# Patient Record
Sex: Female | Born: 1970 | Race: Black or African American | Hispanic: No | Marital: Single | Smoking: Never smoker
Health system: Southern US, Community
[De-identification: ages and names within clinical notes are randomized; demographics above are authoritative.]

## PROBLEM LIST (undated history)

## (undated) DIAGNOSIS — D649 Anemia, unspecified: Secondary | ICD-10-CM

## (undated) DIAGNOSIS — K469 Unspecified abdominal hernia without obstruction or gangrene: Secondary | ICD-10-CM

## (undated) DIAGNOSIS — K449 Diaphragmatic hernia without obstruction or gangrene: Secondary | ICD-10-CM

## (undated) DIAGNOSIS — K922 Gastrointestinal hemorrhage, unspecified: Secondary | ICD-10-CM

## (undated) DIAGNOSIS — F419 Anxiety disorder, unspecified: Secondary | ICD-10-CM

## (undated) DIAGNOSIS — N83209 Unspecified ovarian cyst, unspecified side: Secondary | ICD-10-CM

## (undated) DIAGNOSIS — Z5189 Encounter for other specified aftercare: Secondary | ICD-10-CM

## (undated) DIAGNOSIS — G4700 Insomnia, unspecified: Secondary | ICD-10-CM

## (undated) HISTORY — PX: WISDOM TOOTH EXTRACTION: SHX21

## (undated) HISTORY — DX: Diaphragmatic hernia without obstruction or gangrene: K44.9

## (undated) HISTORY — DX: Gastrointestinal hemorrhage, unspecified: K92.2

## (undated) HISTORY — DX: Anemia, unspecified: D64.9

## (undated) HISTORY — DX: Anxiety disorder, unspecified: F41.9

## (undated) HISTORY — DX: Insomnia, unspecified: G47.00

## (undated) HISTORY — DX: Encounter for other specified aftercare: Z51.89

---

## 1994-10-23 DIAGNOSIS — F419 Anxiety disorder, unspecified: Secondary | ICD-10-CM

## 1994-10-23 HISTORY — DX: Anxiety disorder, unspecified: F41.9

## 2010-02-21 ENCOUNTER — Ambulatory Visit: Payer: Self-pay | Admitting: Family Medicine

## 2011-12-22 ENCOUNTER — Encounter (HOSPITAL_COMMUNITY): Payer: Self-pay

## 2011-12-22 ENCOUNTER — Emergency Department (HOSPITAL_COMMUNITY): Payer: Self-pay

## 2011-12-22 ENCOUNTER — Emergency Department (HOSPITAL_COMMUNITY)
Admission: EM | Admit: 2011-12-22 | Discharge: 2011-12-22 | Disposition: A | Discharge status: Home / Self Care | Payer: Self-pay | Attending: Emergency Medicine | Admitting: Emergency Medicine

## 2011-12-22 ENCOUNTER — Other Ambulatory Visit: Payer: Self-pay

## 2011-12-22 DIAGNOSIS — R5381 Other malaise: Secondary | ICD-10-CM | POA: Insufficient documentation

## 2011-12-22 DIAGNOSIS — A084 Viral intestinal infection, unspecified: Secondary | ICD-10-CM

## 2011-12-22 DIAGNOSIS — R197 Diarrhea, unspecified: Secondary | ICD-10-CM | POA: Insufficient documentation

## 2011-12-22 DIAGNOSIS — A088 Other specified intestinal infections: Secondary | ICD-10-CM | POA: Insufficient documentation

## 2011-12-22 DIAGNOSIS — R1084 Generalized abdominal pain: Secondary | ICD-10-CM | POA: Insufficient documentation

## 2011-12-22 DIAGNOSIS — R55 Syncope and collapse: Secondary | ICD-10-CM | POA: Insufficient documentation

## 2011-12-22 DIAGNOSIS — R112 Nausea with vomiting, unspecified: Secondary | ICD-10-CM | POA: Insufficient documentation

## 2011-12-22 DIAGNOSIS — R000 Tachycardia, unspecified: Secondary | ICD-10-CM | POA: Insufficient documentation

## 2011-12-22 LAB — CBC
HCT: 38 % (ref 36.0–46.0)
Hemoglobin: 12.7 g/dL (ref 12.0–15.0)
MCH: 31.1 pg (ref 26.0–34.0)
MCHC: 33.4 g/dL (ref 30.0–36.0)
MCV: 92.9 fL (ref 78.0–100.0)
Platelets: 434 K/uL — ABNORMAL HIGH (ref 150–400)
RBC: 4.09 MIL/uL (ref 3.87–5.11)
RDW: 12.5 % (ref 11.5–15.5)
WBC: 6.8 K/uL (ref 4.0–10.5)

## 2011-12-22 LAB — URINALYSIS, ROUTINE W REFLEX MICROSCOPIC
Bilirubin Urine: NEGATIVE
Glucose, UA: NEGATIVE mg/dL
Hgb urine dipstick: NEGATIVE
Ketones, ur: NEGATIVE mg/dL
Leukocytes, UA: NEGATIVE
Nitrite: NEGATIVE
Protein, ur: NEGATIVE mg/dL
Specific Gravity, Urine: 1.012 (ref 1.005–1.030)
Urobilinogen, UA: 1 mg/dL (ref 0.0–1.0)
pH: 8.5 — ABNORMAL HIGH (ref 5.0–8.0)

## 2011-12-22 LAB — BASIC METABOLIC PANEL
CO2: 30 mEq/L (ref 19–32)
Chloride: 98 mEq/L (ref 96–112)
Creatinine, Ser: 0.74 mg/dL (ref 0.50–1.10)
GFR calc Af Amer: >90 mL/min (ref >90)
Potassium: 3.7 mEq/L (ref 3.5–5.1)

## 2011-12-22 LAB — DIFFERENTIAL
Basophils Absolute: 0 10*3/uL (ref 0.0–0.1)
Lymphocytes Relative: 34 % (ref 12–46)
Monocytes Absolute: 0.5 10*3/uL (ref 0.1–1.0)
Neutro Abs: 3.9 10*3/uL (ref 1.7–7.7)
Neutrophils Relative %: 57 % (ref 43–77)

## 2011-12-22 LAB — POCT PREGNANCY, URINE: Preg Test, Ur: NEGATIVE

## 2011-12-22 MED ORDER — NAPROXEN 500 MG PO TABS: 500.0000 mg | ORAL_TABLET | Freq: Two times a day (BID) | ORAL | Status: AC

## 2011-12-22 MED ORDER — TRAMADOL HCL 50 MG PO TABS: 50.0000 mg | ORAL_TABLET | Freq: Four times a day (QID) | ORAL | Status: AC | PRN

## 2011-12-22 MED ORDER — ONDANSETRON HCL 4 MG/2ML IJ SOLN
4.0000 mg | Freq: Once | INTRAMUSCULAR | Status: AC
  Administered 2011-12-22: 4 mg via INTRAVENOUS
  Filled 2011-12-22: qty 2

## 2011-12-22 MED ORDER — MORPHINE SULFATE 4 MG/ML IJ SOLN
4.0000 mg | Freq: Once | INTRAMUSCULAR | Status: AC
  Administered 2011-12-22: 4 mg via INTRAVENOUS
  Filled 2011-12-22: qty 1

## 2011-12-22 MED ORDER — SODIUM CHLORIDE 0.9 % IV BOLUS (SEPSIS)
1000.0000 mL | Freq: Once | INTRAVENOUS | Status: AC
  Administered 2011-12-22: 1000 mL via INTRAVENOUS

## 2011-12-22 MED ORDER — ONDANSETRON HCL 4 MG PO TABS: 4.0000 mg | ORAL_TABLET | Freq: Four times a day (QID) | ORAL | Status: AC

## 2011-12-22 NOTE — ED Provider Notes (Signed)
History     CSN: [id number]  Arrival date & time 12/22/11  1516   First MD Initiated Contact with Patient 12/22/11 1617      Chief Complaint  Patient presents with  . Nausea    hasn't felt well since past tuesday  . Dizziness  . Diarrhea    (Consider location/radiation/quality/duration/timing/severity/associated sxs/prior treatment) HPI Comments: Patient had a syncopal event prior to arrival. States she's been feeling ill for the past 3-5 days.  nbnb diarrhea/vomiting  Patient is a 41 y.o. female presenting with diarrhea. The history is provided by the patient. No language interpreter was used.  Diarrhea The primary symptoms include fatigue, abdominal pain, nausea and diarrhea. Primary symptoms do not include fever, vomiting, melena, hematemesis, dysuria, myalgias or arthralgias. The illness began 3 to 5 days ago. The onset was gradual. The problem has been gradually worsening.  The fatigue began 3 to 5 days ago. The fatigue has been worsening since its onset. The fatigue is worsened by nothing.  The abdominal pain began more than 2 days ago. The abdominal pain has been gradually worsening since its onset. The abdominal pain is generalized. The abdominal pain does not radiate. The abdominal pain is relieved by nothing.  Nausea began 3 to 5 days ago. The nausea is associated with eating. The nausea is exacerbated by food.  The diarrhea began 3 to 5 days ago. The diarrhea is watery. The diarrhea occurs 2 to 4 times per day.  The illness does not include chills, anorexia, constipation or back pain.    No past medical history on file.  Past Surgical History  Procedure Date  . Cesarean section     x2   . Wisdom tooth extraction     No family history on file.  History  Substance Use Topics  . Smoking status: Never Smoker   . Smokeless tobacco: Not on file  . Alcohol Use: Yes     occasionally    OB History    Grav Para Term Preterm Abortions TAB SAB Ect Mult Living           Review of Systems  Constitutional: Positive for activity change, appetite change and fatigue. Negative for fever and chills.  HENT: Negative for congestion, rhinorrhea, neck pain and neck stiffness.   Respiratory: Negative for cough and shortness of breath.   Cardiovascular: Negative for chest pain and palpitations.  Gastrointestinal: Positive for nausea, abdominal pain and diarrhea. Negative for vomiting, constipation, blood in stool, melena, anorexia and hematemesis.  Genitourinary: Negative for dysuria, urgency, frequency and flank pain.  Musculoskeletal: Negative for myalgias, back pain and arthralgias.  Neurological: Positive for syncope and weakness. Negative for dizziness, light-headedness, numbness and headaches.  All other systems reviewed and are negative.    Allergies  Review of patient's allergies indicates no known allergies.  Home Medications   Current Outpatient Rx  Name Route Sig Dispense Refill  . DIPHENHYDRAMINE HCL 25 MG PO TABS Oral Take 25 mg by mouth every 6 (six) hours as needed. For allergy symptom relief    . IBUPROFEN 200 MG PO TABS Oral Take 200 mg by mouth every 6 (six) hours as needed. For pain relief    . LORAZEPAM 1 MG PO TABS Oral Take 0.5 mg by mouth every 8 (eight) hours.    Marland Kitchen PRENATAL MULTIVITAMIN CH Oral Take 1 tablet by mouth daily.    . SODIUM & POTASSIUM BICARBONATE PO TBEF Oral Take 1 tablet by mouth daily as needed. For cold  and flu symptom relief    . NAPROXEN 500 MG PO TABS Oral Take 1 tablet (500 mg total) by mouth 2 (two) times daily. 30 tablet 0  . ONDANSETRON HCL 4 MG PO TABS Oral Take 1 tablet (4 mg total) by mouth every 6 (six) hours. 12 tablet 0  . TRAMADOL HCL 50 MG PO TABS Oral Take 1 tablet (50 mg total) by mouth every 6 (six) hours as needed for pain. 15 tablet 0    BP 102/51  Pulse 88  Temp 98.4 F (36.9 C)  Resp 20  Ht 4\' 11"  (1.499 m)  Wt 125 lb (56.7 kg)  BMI 25.25 kg/m2  SpO2 100%  LMP 12/15/2011  Physical  Exam  Nursing note and vitals reviewed. Constitutional: She is oriented to person, place, and time. She appears well-developed and well-nourished. No distress.  HENT:  Head: Normocephalic and atraumatic.  Mouth/Throat: Oropharynx is clear and moist.  Eyes: Conjunctivae and EOM are normal. Pupils are equal, round, and reactive to light.  Neck: Normal range of motion. Neck supple.  Cardiovascular: Regular rhythm, normal heart sounds and intact distal pulses.  Exam reveals no gallop and no friction rub.   No murmur heard.      Tachycardic rate  Pulmonary/Chest: Effort normal and breath sounds normal. No respiratory distress. She exhibits no tenderness.  Abdominal: Soft. Bowel sounds are normal. There is no tenderness. There is no rebound and no guarding.  Musculoskeletal: Normal range of motion. She exhibits no tenderness.  Lymphadenopathy:    She has no cervical adenopathy.  Neurological: She is alert and oriented to person, place, and time. No cranial nerve deficit.  Skin: Skin is warm and dry. No rash noted.    ED Course  Procedures (including critical care time)   Date: 12/22/2011  Rate: 105  Rhythm: sinus tachycardia  QRS Axis: normal  Intervals: normal  ST/T Wave abnormalities: normal  Conduction Disutrbances:none  Narrative Interpretation:   Old EKG Reviewed: none available  Labs Reviewed  URINALYSIS, ROUTINE W REFLEX MICROSCOPIC - Abnormal; Notable for the following:    pH 8.5 (*)    All other components within normal limits  CBC - Abnormal; Notable for the following:    Platelets 434 (*)    All other components within normal limits  DIFFERENTIAL  BASIC METABOLIC PANEL  POCT PREGNANCY, URINE   Dg Hip Complete Left  12/22/2011  *RADIOLOGY REPORT*  Clinical Data: Nausea, dizziness and diarrhea, left hip pain  LEFT HIP - COMPLETE 2+ VIEW  Comparison: None.  Findings: There is no evidence of fracture or dislocation.  There is no evidence of arthropathy or other focal bone  abnormality. Soft tissues are unremarkable.  IMPRESSION: Negative exam  Original Report Authenticated By: Rosealee Albee, M.D.     1. Viral gastroenteritis   2. Syncope       MDM  Viral gastroenteritis with secondary syncope from dehydration. Received a liter of fluids with resolution of her symptoms. Hip x-ray negative. No medication for head CT. Remainder workup unremarkable by mouth be discharged home with instructions to followup with her primary care physician and continue aggressive oral hydration at home.  Tachycardia resolved. Signs and symptoms for which to return provided        Dayton Bailiff, MD 12/22/11 2045

## 2011-12-22 NOTE — Discharge Instructions (Signed)
Diet for Diarrhea, Adult Having frequent, runny stools (diarrhea) has many causes. Diarrhea may be caused or worsened by food or drink. Diarrhea may be relieved by changing your diet. IF YOU ARE NOT TOLERATING SOLID FOODS:  Drink enough water and fluids to keep your urine clear or pale yellow.   Avoid sugary drinks and sodas as well as milk-based beverages.   Avoid beverages containing caffeine and alcohol.   You may try rehydrating beverages. You can make your own by following this recipe:    tsp table salt.    tsp baking soda.   ? tsp salt substitute (potassium chloride).   1 tbs + 1 tsp sugar.   1 qt water.  As your stools become more solid, you can start eating solid foods. Add foods one at a time. If a certain food causes your diarrhea to get worse, avoid that food and try other foods. A low fiber, low-fat, and lactose-free diet is recommended. Small, frequent meals may be better tolerated.  Starches  Allowed:  White, French, and pita breads, plain rolls, buns, bagels. Plain muffins, matzo. Soda, saltine, or graham crackers. Pretzels, melba toast, zwieback. Cooked cereals made with water: cornmeal, farina, cream cereals. Dry cereals: refined corn, wheat, rice. Potatoes prepared any way without skins, refined macaroni, spaghetti, noodles, refined rice.   Avoid:  Bread, rolls, or crackers made with whole wheat, multi-grains, rye, bran seeds, nuts, or coconut. Corn tortillas or taco shells. Cereals containing whole grains, multi-grains, bran, coconut, nuts, or raisins. Cooked or dry oatmeal. Coarse wheat cereals, granola. Cereals advertised as "high-fiber." Potato skins. Whole grain pasta, wild or brown rice. Popcorn. Sweet potatoes/yams. Sweet rolls, doughnuts, waffles, pancakes, sweet breads.  Vegetables  Allowed: Strained tomato and vegetable juices. Most well-cooked and canned vegetables without seeds. Fresh: Tender lettuce, cucumber without the skin, cabbage, spinach, bean  sprouts.   Avoid: Fresh, cooked, or canned: Artichokes, baked beans, beet greens, broccoli, Brussels sprouts, corn, kale, legumes, peas, sweet potatoes. Cooked: Green or red cabbage, spinach. Avoid large servings of any vegetables, because vegetables shrink when cooked, and they contain more fiber per serving than fresh vegetables.  Fruit  Allowed: All fruit juices except prune juice. Cooked or canned: Apricots, applesauce, cantaloupe, cherries, fruit cocktail, grapefruit, grapes, kiwi, mandarin oranges, peaches, pears, plums, watermelon. Fresh: Apples without skin, ripe banana, grapes, cantaloupe, cherries, grapefruit, peaches, oranges, plums. Keep servings limited to  cup or 1 piece.   Avoid: Fresh: Apple with skin, apricots, mango, pears, raspberries, strawberries. Prune juice, stewed or dried prunes. Dried fruits, raisins, dates. Large servings of all fresh fruits.  Meat and Meat Substitutes  Allowed: Ground or well-cooked tender beef, ham, veal, lamb, pork, or poultry. Eggs, plain cheese. Fish, oysters, shrimp, lobster, other seafoods. Liver, organ meats.   Avoid: Tough, fibrous meats with gristle. Peanut butter, smooth or chunky. Cheese, nuts, seeds, legumes, dried peas, beans, lentils.  Milk  Allowed: Yogurt, lactose-free milk, kefir, drinkable yogurt, buttermilk, soy milk.   Avoid: Milk, chocolate milk, beverages made with milk, such as milk shakes.  Soups  Allowed: Bouillon, broth, or soups made from allowed foods. Any strained soup.   Avoid: Soups made from vegetables that are not allowed, cream or milk-based soups.  Desserts and Sweets  Allowed: Sugar-free gelatin, sugar-free frozen ice pops made without sugar alcohol.   Avoid: Plain cakes and cookies, pie made with allowed fruit, pudding, custard, cream pie. Gelatin, fruit, ice, sherbet, frozen ice pops. Ice cream, ice milk without nuts. Plain hard candy,   honey, jelly, molasses, syrup, sugar, chocolate syrup, gumdrops,  marshmallows.  Fats and Oils  Allowed: Avoid any fats and oils.   Avoid: Seeds, nuts, olives, avocados. Margarine, butter, cream, mayonnaise, salad oils, plain salad dressings made from allowed foods. Plain gravy, crisp bacon without rind.  Beverages  Allowed: Water, decaffeinated teas, oral rehydration solutions, sugar-free beverages.   Avoid: Fruit juices, caffeinated beverages (coffee, tea, soda or pop), alcohol, sports drinks, or lemon-lime soda or pop.  Condiments  Allowed: Ketchup, mustard, horseradish, vinegar, cream sauce, cheese sauce, cocoa powder. Spices in moderation: allspice, basil, bay leaves, celery powder or leaves, cinnamon, cumin powder, curry powder, ginger, mace, marjoram, onion or garlic powder, oregano, paprika, parsley flakes, ground pepper, rosemary, sage, savory, tarragon, thyme, turmeric.   Avoid: Coconut, honey.  Weight Monitoring: Weigh yourself every day. You should weigh yourself in the morning after you urinate and before you eat breakfast. Wear the same amount of clothing when you weigh yourself. Record your weight daily. Bring your recorded weights to your clinic visits. Tell your caregiver right away if you have gained 3 lb/1.4 kg or more in 1 day, 5 lb/2.3 kg in a week, or whatever amount you were told to report. SEEK IMMEDIATE MEDICAL CARE IF:   You are unable to keep fluids down.   You start to throw up (vomit) or diarrhea keeps coming back (persistent).   Abdominal pain develops, increases, or can be felt in one place (localizes).   You have an oral temperature above 102 F (38.9 C), not controlled by medicine.   Diarrhea contains blood or mucus.   You develop excessive weakness, dizziness, fainting, or extreme thirst.  MAKE SURE YOU:   Understand these instructions.   Will watch your condition.   Will get help right away if you are not doing well or get worse.  Document Released: 12/30/2003 Document Revised: 06/21/2011 Document Reviewed:  04/22/2009 [facility] Patient Information 2012 [place], [place].  Syncope You have had a fainting (syncopal) spell. A fainting episode is a sudden, short-lived loss of consciousness. It results in complete recovery. It occurs because there has been a temporary shortage of oxygen and/or sugar (glucose) to the brain. CAUSES   Blood pressure pills and other medications that may lower blood pressure below normal. Sudden changes in posture (sudden standing).   Over-medication. Take your medications as directed.   Standing too long. This can cause blood to pool in the legs.   Seizure disorders.   Low blood sugar (hypoglycemia) of diabetes. This more commonly causes coma.   Bearing down to go to the bathroom. This can cause your blood pressure to rise suddenly. Your body compensates by making the blood pressure too low when you stop bearing down.   Hardening of the arteries where the brain temporarily does not receive enough blood.   Irregular heart beat and circulatory problems.   Fear, emotional distress, injury, sight of blood, or illness.  Your caregiver will send you home if the syncope was from non-worrisome causes (benign). Depending on your age and health, you may stay to be monitored and observed. If you return home, have someone stay with you if your caregiver feels that is desirable. It is very important to keep all follow-up referrals and appointments in order to properly manage this condition. This is a serious problem which can lead to serious illness and death if not carefully managed.  WARNING: Do not drive or operate machinery until your caregiver feels that it is safe for you to  do so. SEEK IMMEDIATE MEDICAL CARE IF:   You have another fainting episode or faint while lying or sitting down. DO NOT DRIVE YOURSELF. Call 911 if no other help is available.   You have chest pain, are feeling sick to your stomach (nausea), vomiting or abdominal pain.   You have an irregular  heartbeat or one that is very fast (pulse over 120 beats per minute).   You have a loss of feeling in some part of your body or lose movement in your arms or legs.   You have difficulty with speech, confusion, severe weakness, or visual problems.   You become sweaty and/or feel light headed.  Make sure you are rechecked as instructed. Document Released: 10/09/2005 Document Revised: 06/21/2011 Document Reviewed: 05/30/2007 [facility] Patient Information 2012 [place], [place].

## 2011-12-22 NOTE — ED Notes (Signed)
Pt states she hasn't felt well since Tuesday. Nausea, dizziness, diarrhea.  States she was sitting at someone's desk at work today and "fell out". Not sure if she passed out but feels pain on LT side.

## 2013-01-06 ENCOUNTER — Emergency Department (HOSPITAL_COMMUNITY)
Admission: EM | Admit: 2013-01-06 | Discharge: 2013-01-06 | Disposition: A | Discharge status: Home / Self Care | Payer: No Typology Code available for payment source | Attending: Emergency Medicine | Admitting: Emergency Medicine

## 2013-01-06 ENCOUNTER — Emergency Department (HOSPITAL_COMMUNITY): Payer: No Typology Code available for payment source

## 2013-01-06 ENCOUNTER — Encounter (HOSPITAL_COMMUNITY): Payer: Self-pay | Admitting: Emergency Medicine

## 2013-01-06 DIAGNOSIS — Y9389 Activity, other specified: Secondary | ICD-10-CM | POA: Insufficient documentation

## 2013-01-06 DIAGNOSIS — S139XXA Sprain of joints and ligaments of unspecified parts of neck, initial encounter: Secondary | ICD-10-CM | POA: Insufficient documentation

## 2013-01-06 DIAGNOSIS — Y92410 Unspecified street and highway as the place of occurrence of the external cause: Secondary | ICD-10-CM | POA: Insufficient documentation

## 2013-01-06 MED ORDER — METHOCARBAMOL 500 MG PO TABS: 500.0000 mg | ORAL_TABLET | Freq: Two times a day (BID) | ORAL | Status: DC

## 2013-01-06 MED ORDER — IBUPROFEN 200 MG PO TABS
600.0000 mg | ORAL_TABLET | Freq: Once | ORAL | Status: AC
  Administered 2013-01-06: 600 mg via ORAL
  Filled 2013-01-06: qty 3

## 2013-01-06 MED ORDER — METHOCARBAMOL 500 MG PO TABS
500.0000 mg | ORAL_TABLET | Freq: Once | ORAL | Status: AC
  Administered 2013-01-06: 500 mg via ORAL
  Filled 2013-01-06: qty 1

## 2013-01-06 MED ORDER — IBUPROFEN 600 MG PO TABS: 600.0000 mg | ORAL_TABLET | Freq: Four times a day (QID) | ORAL | Status: DC | PRN

## 2013-01-06 NOTE — ED Notes (Signed)
Pt return from testing, RN introduce self to patient with plan of care updated and pt verbalize understanding. Pt has c-collar on and reports having pain to "my neck and back" 7/10. Pt INAD, resp e/u and will continue to monitor pt. Pt is awaiting test results for disposition.
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
## 2013-01-06 NOTE — ED Notes (Signed)
Patient indicated she was concerned about affording the test. Currently verbalized the need for test.

## 2013-01-06 NOTE — ED Notes (Signed)
Pt denies any questions upon discharge. 

## 2013-01-06 NOTE — ED Notes (Signed)
EDP at bedside spoke with patient and now wants the CT scan. CT notified.

## 2013-01-06 NOTE — ED Notes (Signed)
Patient took off c-collar and stated did not need CT. Instructed to put c-collar back in place and notified EDP.
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
## 2013-03-03 ENCOUNTER — Encounter: Payer: Self-pay | Admitting: Medical

## 2013-03-03 ENCOUNTER — Ambulatory Visit (INDEPENDENT_AMBULATORY_CARE_PROVIDER_SITE_OTHER): Payer: Self-pay | Admitting: Medical

## 2013-03-03 VITALS — BP 110/70 | HR 82 | Temp 98.4°F | Wt 131.0 lb

## 2013-03-03 DIAGNOSIS — G4700 Insomnia, unspecified: Secondary | ICD-10-CM

## 2013-03-03 DIAGNOSIS — F411 Generalized anxiety disorder: Secondary | ICD-10-CM

## 2013-03-03 MED ORDER — ZOLPIDEM TARTRATE 5 MG PO TABS: 5.0000 mg | ORAL_TABLET | Freq: Every evening | ORAL | Status: DC | PRN

## 2013-03-03 MED ORDER — LORAZEPAM 2 MG PO TABS: 2.0000 mg | ORAL_TABLET | Freq: Two times a day (BID) | ORAL | Status: DC | PRN

## 2013-03-03 MED ORDER — CITALOPRAM HYDROBROMIDE 20 MG PO TABS: ORAL_TABLET | ORAL | Status: DC

## 2013-03-03 NOTE — Patient Instructions (Signed)
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
## 2013-03-27 ENCOUNTER — Telehealth: Payer: Self-pay | Admitting: Medical

## 2013-03-27 NOTE — Telephone Encounter (Signed)
Pt  Called and requested a refill on ativan. Please send to [facility] on [address].

## 2013-03-28 ENCOUNTER — Other Ambulatory Visit: Payer: Self-pay | Admitting: Medical

## 2013-03-28 MED ORDER — LORAZEPAM 2 MG PO TABS: 2.0000 mg | ORAL_TABLET | Freq: Two times a day (BID) | ORAL | Status: DC | PRN

## 2013-03-28 NOTE — Telephone Encounter (Signed)
Call out the Ativan, make sure she is using the Celexa which we began at her last visit.  Make f/u appt which can be recheck and CPX

## 2013-03-28 NOTE — Telephone Encounter (Signed)
Ativan was called out to the pharmacy per Crosby Oyster PA-C.CLS

## 2013-04-07 ENCOUNTER — Ambulatory Visit: Payer: Self-pay | Admitting: Medical

## 2013-04-07 ENCOUNTER — Encounter: Payer: Self-pay | Admitting: Medical

## 2013-04-07 ENCOUNTER — Telehealth: Payer: Self-pay | Admitting: Internal Medicine

## 2013-04-07 ENCOUNTER — Ambulatory Visit (INDEPENDENT_AMBULATORY_CARE_PROVIDER_SITE_OTHER): Payer: Self-pay | Admitting: Medical

## 2013-04-07 DIAGNOSIS — T43205A Adverse effect of unspecified antidepressants, initial encounter: Secondary | ICD-10-CM

## 2013-04-07 DIAGNOSIS — T43225A Adverse effect of selective serotonin reuptake inhibitors, initial encounter: Secondary | ICD-10-CM

## 2013-04-07 DIAGNOSIS — F411 Generalized anxiety disorder: Secondary | ICD-10-CM

## 2013-04-07 MED ORDER — SERTRALINE HCL 50 MG PO TABS: 50.0000 mg | ORAL_TABLET | Freq: Every day | ORAL | Status: DC

## 2013-04-07 NOTE — Telephone Encounter (Signed)
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

## 2013-04-07 NOTE — Telephone Encounter (Signed)
She was a new patient last visit.  We have follow ups for this very type of issue - medication problems, side effects, etc.   Thus, she needs f/u.   If she had a problem with Citalopram, find out what happened, and we can look at other options.    Bennye Alm -she has been on Ativan BID prior which is a controled substance.   I am not going to just keep refilling this.   Thus, if this is all she is wanting, we need to discussed other option including referral to psychiatry.

## 2013-04-07 NOTE — Telephone Encounter (Signed)
Patient states she took the medication for 14 days. She said she would eat something to help but her stomach always felt upset. CLS She states that the medication caused her to vomit. CLS

## 2013-04-07 NOTE — Telephone Encounter (Signed)
Pt is not going to make appt today and would like you to call her. She states she wants to talk to you about her med. celexa is not helping and she is having side effects. She is vomiting a lot about and hour and half after she takes the med. She stayed on it about 14 days and then has stopped the med now. If you need to send something else to pharmacy. Send to [email or web address]. Pt call pt at [phone] ext [phone]
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
## 2013-05-04 ENCOUNTER — Other Ambulatory Visit: Payer: Self-pay | Admitting: Medical

## 2013-05-05 NOTE — Telephone Encounter (Signed)
Is it okay to refill?

## 2013-05-06 NOTE — Telephone Encounter (Signed)
Patient's medication was called out to the pharmacy per Crosby Oyster PA-C. CLS

## 2013-05-07 ENCOUNTER — Other Ambulatory Visit: Payer: Self-pay | Admitting: Medical

## 2013-05-21 ENCOUNTER — Telehealth: Payer: Self-pay | Admitting: Medical

## 2013-05-21 ENCOUNTER — Other Ambulatory Visit: Payer: Self-pay | Admitting: Medical

## 2013-05-21 MED ORDER — ZOLPIDEM TARTRATE 10 MG PO TABS: 10.0000 mg | ORAL_TABLET | Freq: Every evening | ORAL | Status: DC | PRN

## 2013-05-21 NOTE — Telephone Encounter (Signed)
Pt called and stated she would like to have the dose of ambien increased. She also states she would like to have her ativan lowered. I did inform pt she would need an appt to discuss these issues and she states she doesn't have ins and cant afford to come in. Please call pt at work [phone] and ask for computer lab.

## 2013-05-21 NOTE — Telephone Encounter (Signed)
Called out ambien 10mg  to [facility] and will lower ativan to 1mg  for when she needs a refill on it the next time. She will call when she is out of ativan to get filled for 1mg 

## 2013-05-21 NOTE — Telephone Encounter (Signed)
Please call out Ambien 10mg , #30, (see order).  Is she also needing a refill on her Ativan?

## 2013-05-21 NOTE — Progress Notes (Signed)
Called out [place]

## 2013-05-26 ENCOUNTER — Other Ambulatory Visit: Payer: Self-pay | Admitting: Medical

## 2013-05-26 MED ORDER — LORAZEPAM 1 MG PO TABS: 1.0000 mg | ORAL_TABLET | Freq: Two times a day (BID) | ORAL | Status: DC | PRN

## 2013-05-26 NOTE — Progress Notes (Signed)
I called out Ativan to her pharmacy per Crosby Oyster PA-C. CLS

## 2013-05-26 NOTE — Telephone Encounter (Signed)
Is it okay to refill?

## 2013-06-17 ENCOUNTER — Other Ambulatory Visit: Payer: Self-pay | Admitting: Medical

## 2013-06-17 NOTE — Telephone Encounter (Signed)
Does she have insurance now?  Verify she is taking Zoloft.  How is she doing currently on the medication?

## 2013-06-17 NOTE — Telephone Encounter (Signed)
Is this okay to fill? 

## 2013-06-18 NOTE — Telephone Encounter (Signed)
I lmom to cb. CLS

## 2013-06-18 NOTE — Telephone Encounter (Signed)
No, she doesn't have any insurance. Yes, she is taking the Zolof and the medication is working well. She said she is doing well. CLS

## 2013-06-19 NOTE — Telephone Encounter (Signed)
Called med out to pharmacy  

## 2013-06-26 ENCOUNTER — Other Ambulatory Visit: Payer: Self-pay | Admitting: Medical

## 2013-06-26 NOTE — Telephone Encounter (Signed)
Called out med to pharmacy 

## 2013-06-26 NOTE — Telephone Encounter (Signed)
Is this okay to refill? 

## 2013-06-27 NOTE — Telephone Encounter (Signed)
LMOM TO CB. CLS 

## 2013-06-30 NOTE — Telephone Encounter (Signed)
Lmom to cb. CLS 

## 2013-07-14 ENCOUNTER — Other Ambulatory Visit: Payer: Self-pay | Admitting: Medical

## 2013-07-14 NOTE — Telephone Encounter (Signed)
I called out Lorazepam to her pharmacy per Crosby Oyster PA-C. CLS

## 2013-07-14 NOTE — Telephone Encounter (Signed)
Is this okay to refill? 

## 2013-07-18 ENCOUNTER — Institutional Professional Consult (permissible substitution): Payer: Self-pay | Admitting: Medical

## 2013-07-23 ENCOUNTER — Institutional Professional Consult (permissible substitution): Payer: Self-pay | Admitting: Medical

## 2013-07-25 ENCOUNTER — Other Ambulatory Visit: Payer: Self-pay | Admitting: Medical

## 2013-07-30 ENCOUNTER — Encounter: Payer: Self-pay | Admitting: Medical

## 2013-07-30 ENCOUNTER — Ambulatory Visit (INDEPENDENT_AMBULATORY_CARE_PROVIDER_SITE_OTHER): Payer: Self-pay | Admitting: Medical

## 2013-07-30 VITALS — BP 110/60 | HR 82 | Temp 99.1°F | Resp 16 | Wt 130.0 lb

## 2013-07-30 DIAGNOSIS — G4700 Insomnia, unspecified: Secondary | ICD-10-CM

## 2013-07-30 DIAGNOSIS — F411 Generalized anxiety disorder: Secondary | ICD-10-CM

## 2013-07-30 MED ORDER — SERTRALINE HCL 50 MG PO TABS: 50.0000 mg | ORAL_TABLET | Freq: Every day | ORAL | Status: DC

## 2013-07-30 MED ORDER — ZOLPIDEM TARTRATE 10 MG PO TABS: 10.0000 mg | ORAL_TABLET | Freq: Every evening | ORAL | Status: DC | PRN
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
## 2013-09-15 ENCOUNTER — Telehealth: Payer: Self-pay | Admitting: Medical

## 2013-09-15 ENCOUNTER — Other Ambulatory Visit: Payer: Self-pay | Admitting: Medical

## 2013-09-15 MED ORDER — LORAZEPAM 1 MG PO TABS: ORAL_TABLET | ORAL | Status: DC

## 2013-09-15 NOTE — Telephone Encounter (Signed)
Call out the medication

## 2013-09-15 NOTE — Progress Notes (Signed)
I called out Ativan to her pharmacy. CLS

## 2013-09-16 NOTE — Telephone Encounter (Signed)
I called the patients medication to her pharmacy on 09/15/13. CLS

## 2013-10-09 ENCOUNTER — Emergency Department (HOSPITAL_COMMUNITY): Payer: Self-pay

## 2013-10-09 ENCOUNTER — Encounter (HOSPITAL_COMMUNITY): Payer: Self-pay | Admitting: Emergency Medicine

## 2013-10-09 ENCOUNTER — Emergency Department (HOSPITAL_COMMUNITY)
Admission: EM | Admit: 2013-10-09 | Discharge: 2013-10-09 | Disposition: A | Discharge status: Home / Self Care | Payer: Self-pay | Attending: Emergency Medicine | Admitting: Emergency Medicine

## 2013-10-09 DIAGNOSIS — N83209 Unspecified ovarian cyst, unspecified side: Secondary | ICD-10-CM | POA: Insufficient documentation

## 2013-10-09 DIAGNOSIS — Z3202 Encounter for pregnancy test, result negative: Secondary | ICD-10-CM | POA: Insufficient documentation

## 2013-10-09 DIAGNOSIS — Z79899 Other long term (current) drug therapy: Secondary | ICD-10-CM | POA: Insufficient documentation

## 2013-10-09 DIAGNOSIS — F411 Generalized anxiety disorder: Secondary | ICD-10-CM | POA: Insufficient documentation

## 2013-10-09 DIAGNOSIS — R61 Generalized hyperhidrosis: Secondary | ICD-10-CM | POA: Insufficient documentation

## 2013-10-09 DIAGNOSIS — M79609 Pain in unspecified limb: Secondary | ICD-10-CM | POA: Insufficient documentation

## 2013-10-09 DIAGNOSIS — R110 Nausea: Secondary | ICD-10-CM | POA: Insufficient documentation

## 2013-10-09 DIAGNOSIS — M25519 Pain in unspecified shoulder: Secondary | ICD-10-CM | POA: Insufficient documentation

## 2013-10-09 DIAGNOSIS — R1012 Left upper quadrant pain: Secondary | ICD-10-CM

## 2013-10-09 DIAGNOSIS — G4700 Insomnia, unspecified: Secondary | ICD-10-CM | POA: Insufficient documentation

## 2013-10-09 DIAGNOSIS — R5381 Other malaise: Secondary | ICD-10-CM | POA: Insufficient documentation

## 2013-10-09 DIAGNOSIS — R079 Chest pain, unspecified: Secondary | ICD-10-CM | POA: Insufficient documentation

## 2013-10-09 DIAGNOSIS — M549 Dorsalgia, unspecified: Secondary | ICD-10-CM | POA: Insufficient documentation

## 2013-10-09 LAB — URINALYSIS, ROUTINE W REFLEX MICROSCOPIC
Bilirubin Urine: NEGATIVE
Glucose, UA: NEGATIVE mg/dL
Hgb urine dipstick: NEGATIVE
Ketones, ur: NEGATIVE mg/dL
Leukocytes, UA: NEGATIVE
Nitrite: NEGATIVE
Protein, ur: NEGATIVE mg/dL
Specific Gravity, Urine: 1.01 (ref 1.005–1.030)
Urobilinogen, UA: 0.2 mg/dL (ref 0.0–1.0)
pH: 8 (ref 5.0–8.0)

## 2013-10-09 LAB — CBC
HCT: 34 % — ABNORMAL LOW (ref 36.0–46.0)
Hemoglobin: 11.5 g/dL — ABNORMAL LOW (ref 12.0–15.0)
MCH: 31.5 pg (ref 26.0–34.0)
MCHC: 33.8 g/dL (ref 30.0–36.0)
MCV: 93.2 fL (ref 78.0–100.0)
Platelets: 392 10*3/uL (ref 150–400)
RBC: 3.65 MIL/uL — ABNORMAL LOW (ref 3.87–5.11)
RDW: 12.7 % (ref 11.5–15.5)
WBC: 7.9 10*3/uL (ref 4.0–10.5)

## 2013-10-09 LAB — BASIC METABOLIC PANEL
BUN: 9 mg/dL (ref 6–23)
CO2: 26 mEq/L (ref 19–32)
Calcium: 9.1 mg/dL (ref 8.4–10.5)
Chloride: 100 mEq/L (ref 96–112)
Creatinine, Ser: 0.59 mg/dL (ref 0.50–1.10)
GFR calc Af Amer: >90 mL/min (ref >90)
GFR calc non Af Amer: >90 mL/min (ref >90)
Glucose, Bld: 83 mg/dL (ref 70–99)
Potassium: 4.2 mEq/L (ref 3.5–5.1)
Sodium: 134 mEq/L — ABNORMAL LOW (ref 135–145)

## 2013-10-09 LAB — POCT PREGNANCY, URINE: Preg Test, Ur: NEGATIVE

## 2013-10-09 LAB — LIPASE, BLOOD: Lipase: 32 U/L (ref 11–59)

## 2013-10-09 LAB — POCT I-STAT TROPONIN I: Troponin i, poc: 0 ng/mL (ref 0.00–0.08)

## 2013-10-09 MED ORDER — SODIUM CHLORIDE 0.9 % IV BOLUS (SEPSIS)
1000.0000 mL | Freq: Once | INTRAVENOUS | Status: AC
  Administered 2013-10-09: 1000 mL via INTRAVENOUS

## 2013-10-09 MED ORDER — TRAMADOL HCL 50 MG PO TABS: 50.0000 mg | ORAL_TABLET | Freq: Four times a day (QID) | ORAL | Status: DC | PRN

## 2013-10-09 MED ORDER — ONDANSETRON HCL 4 MG/2ML IJ SOLN
4.0000 mg | Freq: Once | INTRAMUSCULAR | Status: AC
  Administered 2013-10-09: 4 mg via INTRAVENOUS
  Filled 2013-10-09: qty 2

## 2013-10-09 MED ORDER — MORPHINE SULFATE 4 MG/ML IJ SOLN
4.0000 mg | Freq: Once | INTRAMUSCULAR | Status: AC
  Administered 2013-10-09: 4 mg via INTRAVENOUS
  Filled 2013-10-09: qty 1

## 2013-10-09 MED ORDER — KETOROLAC TROMETHAMINE 15 MG/ML IJ SOLN
15.0000 mg | Freq: Once | INTRAMUSCULAR | Status: AC
  Administered 2013-10-09: 15 mg via INTRAVENOUS
  Filled 2013-10-09: qty 1

## 2013-10-09 NOTE — ED Notes (Signed)
Patient transported to CT 

## 2013-10-09 NOTE — ED Notes (Signed)
Pt reports chest pain, night sweats, sob, nausea, weakness, and left flank and low abd pain. Onset three to four days ago.

## 2013-10-09 NOTE — Discharge Planning (Signed)
Partnership for [facility] Community Liaison ext [phone]  Follow up appointment made for 11/03/13 at 3:30pm with the [facility] to establish primary care and to obtain the orange card. Application and my contact information was given for any future questions or concerns. Patient is aware of this appointment.

## 2013-10-10 ENCOUNTER — Ambulatory Visit (INDEPENDENT_AMBULATORY_CARE_PROVIDER_SITE_OTHER): Payer: Self-pay | Admitting: Medical

## 2013-10-10 ENCOUNTER — Encounter: Payer: Self-pay | Admitting: Medical

## 2013-10-10 VITALS — BP 130/60 | HR 118 | Temp 99.5°F | Wt 145.0 lb

## 2013-10-10 DIAGNOSIS — N83299 Other ovarian cyst, unspecified side: Secondary | ICD-10-CM

## 2013-10-10 DIAGNOSIS — R102 Pelvic and perineal pain: Secondary | ICD-10-CM

## 2013-10-10 DIAGNOSIS — N83209 Unspecified ovarian cyst, unspecified side: Secondary | ICD-10-CM

## 2013-10-10 MED ORDER — HYDROCODONE-ACETAMINOPHEN 7.5-325 MG PO TABS: 1.0000 | ORAL_TABLET | Freq: Four times a day (QID) | ORAL | Status: DC | PRN

## 2013-10-10 NOTE — Progress Notes (Signed)
Subjective: Here for pelvic pain, hospital emergency dept f/u.  Went to ED last night, they ended up finding cyst on both ovaries, but left one painful.  She denies prior cysts.   Started having pain 4-5 days ago, but got to the point that she was in severe pain yesterday.  She had some nausea, but no vaginal discharge, no back pain, no vomiting, no bowel changes.   She has noted to have low grade fever.  They gave her pain medications while there, including Morphine, sent her home on tramadol.  Advised she f/u with gynecology.  Using tramadol, less severe pain, but pain is still there.  No other new symptoms.    Past Medical History  Diagnosis Date  . Anxiety 1996  . Insomnia    ROS as in subjective  Objective: Gen: wd, wn, seated Back: nontender Abdomen: +bs, soft, tender pelvic region genarlized, L>R, no mass, no organomegaly Lungs: clear Heart: RRR, normal S1, S2, no murmurs Ext: no edema Pulses normal   Assessment: Encounter Diagnoses  Name Primary?  . Pelvic pain Yes  . Complex ovarian cyst     Plan: Discussed her symptoms, findings on ultrasound and CT, labs.  reviewed ED report.  Script for Hydrocodone prn.   For now can use Hydrocodone for pain q6hours this weekend, then Ultram or OTC Analgesic prn.  We will refer to gynecology.  If worse in the meantime, recheck.

## 2013-10-13 ENCOUNTER — Telehealth: Payer: Self-pay | Admitting: Internal Medicine

## 2013-10-13 NOTE — Telephone Encounter (Signed)
If in worsening pain, will need to see gynecology.  If she wants, go ahead and refer to first available gynecologist for ovarian cyst, pelvic pain, worsening.  Send OV notes, ED report, ultrasound and lab results.

## 2013-10-13 NOTE — Telephone Encounter (Signed)
Patient states that the medication is working better than the Tramadol but she is still having pains. She said that she is also taking Aleve. She did agree to the OB/ GYN appointment. CLS   Appt. Is Dr. Clearance Coots [phone] 11/10/13 @ 215 pm

## 2013-10-13 NOTE — Telephone Encounter (Signed)
Pt states that her pelvic pain on her left side is still in a lot of pain. Her pain level is a 8 and she has tried taking aleve with the pain medicine but she is still in a great deal of pain. The tramadol was causing her to have a fever so she was unable to take that. Send to [facility] [address]. If you need to call pt call [phone] ext [phone]

## 2013-10-14 ENCOUNTER — Other Ambulatory Visit: Payer: Self-pay | Admitting: Medical

## 2013-10-14 ENCOUNTER — Telehealth: Payer: Self-pay | Admitting: Medical

## 2013-10-14 MED ORDER — HYDROCODONE-ACETAMINOPHEN 7.5-325 MG PO TABS: 1.0000 | ORAL_TABLET | Freq: Four times a day (QID) | ORAL | Status: DC | PRN

## 2013-10-14 NOTE — Telephone Encounter (Signed)
rx ready.  Of note, she has gyn appt scheduled.

## 2013-10-14 NOTE — Telephone Encounter (Signed)
Pt is requesting a refill on her pain medication. She states she still has some but it will not last until we get back in the office on Monday. Call when ready.

## 2013-10-20 ENCOUNTER — Other Ambulatory Visit: Payer: Self-pay | Admitting: Medical

## 2013-10-20 ENCOUNTER — Telehealth: Payer: Self-pay | Admitting: Medical

## 2013-10-20 ENCOUNTER — Telehealth: Payer: Self-pay | Admitting: Family Medicine

## 2013-10-20 MED ORDER — HYDROCODONE-ACETAMINOPHEN 7.5-325 MG PO TABS: 1.0000 | ORAL_TABLET | Freq: Four times a day (QID) | ORAL | Status: DC | PRN

## 2013-10-20 MED ORDER — OXYCODONE-ACETAMINOPHEN 5-325 MG PO TABS: 1.0000 | ORAL_TABLET | Freq: Three times a day (TID) | ORAL | Status: DC | PRN

## 2013-10-20 NOTE — Telephone Encounter (Signed)
Pt called and stated that she is still in a lot of pain. Requesting something else for pain. Pt uses [facility] on [address]. Pt can be reached at [phone] ext [phone]

## 2013-10-20 NOTE — Telephone Encounter (Signed)
If her pain is worse than last week, needs to see gyn sooner than scheculed.   I can't keep refilling narcotics.  I give one more round of hydrocodone.

## 2013-10-20 NOTE — Telephone Encounter (Signed)
Advised pt rx for Hydrocodone is ready.

## 2013-10-20 NOTE — Telephone Encounter (Signed)
Patient came by the office and Kristian Covey PA-C change her pain medication and she was given the Rx. CLS

## 2013-10-21 NOTE — ED Provider Notes (Signed)
CSN: [id number]     Arrival date & time 10/09/13  1478 History   First MD Initiated Contact with Patient 10/09/13 [phone]     Chief Complaint  Patient presents with  . Chest Pain  . Abdominal Pain   (Consider location/radiation/quality/duration/timing/severity/associated sxs/prior Treatment) HPI  42 year old female with abdominal pain. Gradual onset about 4 days ago. Pain is been relatively constant progressive. Pain is in the left upper quadrant left flank. Does not radiate. Says he would nausea. No vomiting. No urinary complaints. No diarrhea. She does show generally weak. Multiple other aches and pains including back, chest, legs, shoulders. Feels that she's had night sweats. No weight loss. No history similar symptoms. No intervention prior to arrival.  Past Medical History  Diagnosis Date  . Anxiety 1996  . Insomnia    Past Surgical History  Procedure Laterality Date  . Cesarean section      x2   . Wisdom tooth extraction     Family History  Problem Relation Age of Onset  . Aneurysm Father     brain  . Hepatitis C Father   . Cancer Maternal Aunt     breast  . Cancer Maternal Uncle     prostate?  Marland Kitchen Alcohol abuse Maternal Grandmother   . Heart disease Paternal Grandmother   . Other Paternal Grandfather     unknown   History  Substance Use Topics  . Smoking status: Never Smoker   . Smokeless tobacco: Not on file  . Alcohol Use: No     Comment: occasionally   OB History   Grav Para Term Preterm Abortions TAB SAB Ect Mult Living                 Review of Systems  All systems reviewed and negative, other than as noted in HPI.   Allergies  Citalopram  Home Medications   Current Outpatient Rx  Name  Route  Sig  Dispense  Refill  . Prenatal Vit-Fe Fumarate-FA (MULTIVITAMIN-PRENATAL) 27-0.8 MG TABS tablet   Oral   Take 1 tablet by mouth daily at 12 noon.         . pseudoephedrine-acetaminophen (TYLENOL SINUS) 30-500 MG TABS   Oral   Take 1 tablet by  mouth every 4 (four) hours as needed (for congestion).         Marland Kitchen zolpidem (AMBIEN) 10 MG tablet   Oral   Take 1 tablet (10 mg total) by mouth at bedtime as needed for sleep.   30 tablet   2   . oxyCODONE-acetaminophen (ROXICET) 5-325 MG per tablet   Oral   Take 1 tablet by mouth every 8 (eight) hours as needed for severe pain.   30 tablet   0   . sertraline (ZOLOFT) 50 MG tablet   Oral   Take 50 mg by mouth daily.          BP 111/77  Pulse 101  Temp(Src) 98.7 F (37.1 C) (Oral)  Resp 16  Ht 4\' 11"  (1.499 m)  Wt 140 lb (63.504 kg)  BMI 28.26 kg/m2  SpO2 100%  LMP 09/17/2013 Physical Exam  Nursing note and vitals reviewed. Constitutional: She appears well-developed and well-nourished. No distress.  HENT:  Head: Normocephalic and atraumatic.  Eyes: Conjunctivae are normal. Right eye exhibits no discharge. Left eye exhibits no discharge.  Neck: Neck supple.  Cardiovascular: Normal rate, regular rhythm and normal heart sounds.  Exam reveals no gallop and no friction rub.   No murmur heard.  Pulmonary/Chest: Effort normal and breath sounds normal. No respiratory distress.  Abdominal: Soft. She exhibits no distension. There is tenderness.  Upper quadrant tenderness without rebound or guarding. No distention  Genitourinary:  No CVA tenderness  Musculoskeletal: She exhibits no edema and no tenderness.  Neurological: She is alert.  Skin: Skin is warm and dry.  Psychiatric: She has a normal mood and affect. Her behavior is normal. Thought content normal.    ED Course  Procedures (including critical care time) Labs Review Labs Reviewed  CBC - Abnormal; Notable for the following:    RBC 3.65 (*)    Hemoglobin 11.5 (*)    HCT 34.0 (*)    All other components within normal limits  BASIC METABOLIC PANEL - Abnormal; Notable for the following:    Sodium 134 (*)    All other components within normal limits  LIPASE, BLOOD  URINALYSIS, ROUTINE W REFLEX MICROSCOPIC  POCT  I-STAT TROPONIN I  POCT PREGNANCY, URINE   Imaging Review No results found.  EKG Interpretation    Date/Time:  Thursday October 09 2013 09:37:56 EST Ventricular Rate:  88 PR Interval:  152 QRS Duration: 62 QT Interval:  340 QTC Calculation: 411 R Axis:   79 Text Interpretation:  Normal sinus rhythm Nonspecific ST and T wave abnormality Abnormal ECG ED PHYSICIAN INTERPRETATION AVAILABLE IN [facility] Confirmed by TEST, RECORD ([id number]) on 10/11/2013 8:49:47 AM            MDM   1. Ovarian cyst   2. LUQ pain    42 year old female with abdominal pain. Left upper quadrant. Patient does has a complex ovarian cysts and an area questionable for hydrosalpinx, query the mesosalpinx versus pyosalpinx. Clinically this not a tubo-ovarian abscess. Patient is actually tender in her left upper quadrant and her lower abdomen/pelvis is benign. She is afebrile. No leukocytosis. Any other complaints which cannot be adequately addressed in the emergency room. She is to followup with her PCP for further evaluation these as well as her abdominal pain or with gynecology. Prescription for tramadol. Return precautions were discussed. Outpatient followup.  Raeford Razor, MD 10/21/13 2040

## 2013-10-27 ENCOUNTER — Other Ambulatory Visit: Payer: Self-pay | Admitting: Medical

## 2013-10-27 ENCOUNTER — Telehealth: Payer: Self-pay | Admitting: Medical

## 2013-10-27 MED ORDER — HYDROCODONE-ACETAMINOPHEN 10-325 MG PO TABS: 1.0000 | ORAL_TABLET | Freq: Four times a day (QID) | ORAL | Status: DC | PRN

## 2013-10-27 NOTE — Telephone Encounter (Signed)
Pt states she also tried 1/4 of Oxycodone and med was too strong for her

## 2013-10-27 NOTE — Telephone Encounter (Signed)
Can she try a 1/4 of a tablet?  If this not tolerable, then I can write a different hydrocodone dose.

## 2013-10-27 NOTE — Telephone Encounter (Signed)
Done.  Stop Oxycodone, f/u with gyn as planned.

## 2013-11-05 ENCOUNTER — Telehealth: Payer: Self-pay | Admitting: Medical

## 2013-11-05 ENCOUNTER — Other Ambulatory Visit: Payer: Self-pay | Admitting: Medical

## 2013-11-05 MED ORDER — HYDROCODONE-ACETAMINOPHEN 10-325 MG PO TABS: 1.0000 | ORAL_TABLET | Freq: Four times a day (QID) | ORAL | Status: DC | PRN

## 2013-11-05 NOTE — Telephone Encounter (Signed)
When is the gyn appt?

## 2013-11-05 NOTE — Telephone Encounter (Signed)
This is the last script.

## 2013-11-05 NOTE — Telephone Encounter (Signed)
Patient is aware that her Rx is ready for pick up and she is also aware that this will be the last Rx of pain medication that he will write. CLS

## 2013-11-05 NOTE — Telephone Encounter (Signed)
The appointment is 11/12/13. CLS

## 2013-11-10 ENCOUNTER — Ambulatory Visit: Payer: Self-pay | Admitting: Obstetrics

## 2013-11-12 ENCOUNTER — Ambulatory Visit: Payer: Self-pay | Admitting: Obstetrics

## 2013-11-13 ENCOUNTER — Ambulatory Visit: Payer: Self-pay

## 2013-11-13 ENCOUNTER — Encounter: Payer: Self-pay | Admitting: Internal Medicine

## 2013-11-13 ENCOUNTER — Ambulatory Visit: Payer: Self-pay | Attending: Internal Medicine | Admitting: Internal Medicine

## 2013-11-13 VITALS — BP 128/84 | HR 82 | Temp 98.9°F | Resp 14 | Ht 59.0 in | Wt 148.6 lb

## 2013-11-13 DIAGNOSIS — R1032 Left lower quadrant pain: Secondary | ICD-10-CM | POA: Insufficient documentation

## 2013-11-13 DIAGNOSIS — M25559 Pain in unspecified hip: Secondary | ICD-10-CM

## 2013-11-13 NOTE — Progress Notes (Signed)
Patient ID: Kimberly Bruce, female   DOB: 10-12-1971, 43 y.o.   MRN: [id number]   CC:  HPI:  43 year old female, initially followed by [facility], who is switching care to our clinic. She is primarily here for pelvic pain which has been present since December. The patient's pain is described in the left lower quadrant and suprapubic region. The patient describes the pain as being constant, no associated nausea or vomiting. She denies any radiation of the pain in her pubic region or to her back. She had an extensive workup including Pelvic ultrasound showed a complex mass in the left ovary, differential including hydrosalpinx,hematosalpinx versus pyosalpinx.  Both ovaries show anechoic mass representing multiple cysts/follicles, no ovarian  torsion. Mild uterine enlargement She has been taking hydrocodone for the pain, however [facility] now refuses to give any more refills, she states that she cannot afford to see a gynecologist     Allergies  Allergen Reactions  . Citalopram Nausea And Vomiting   Past Medical History  Diagnosis Date  . Anxiety 1996  . Insomnia    Current Outpatient Prescriptions on File Prior to Visit  Medication Sig Dispense Refill  . HYDROcodone-acetaminophen (NORCO) 10-325 MG per tablet Take 1 tablet by mouth every 6 (six) hours as needed.  20 tablet  0  . zolpidem (AMBIEN) 10 MG tablet Take 1 tablet (10 mg total) by mouth at bedtime as needed for sleep.  30 tablet  2  . Prenatal Vit-Fe Fumarate-FA (MULTIVITAMIN-PRENATAL) 27-0.8 MG TABS tablet Take 1 tablet by mouth daily at 12 noon.      . pseudoephedrine-acetaminophen (TYLENOL SINUS) 30-500 MG TABS Take 1 tablet by mouth every 4 (four) hours as needed (for congestion).      . sertraline (ZOLOFT) 50 MG tablet Take 50 mg by mouth daily.       No current facility-administered medications on file prior to visit.   Family History  Problem Relation Age of Onset  . Aneurysm Father      brain  . Hepatitis C Father   . Cancer Maternal Aunt     breast  . Cancer Maternal Uncle     prostate?  Marland Kitchen Alcohol abuse Maternal Grandmother   . Heart disease Paternal Grandmother   . Other Paternal Grandfather     unknown   History   Social History  . Marital Status: Single    Spouse Name: N/A    Number of Children: N/A  . Years of Education: N/A   Occupational History  . Not on file.   Social History Main Topics  . Smoking status: Never Smoker   . Smokeless tobacco: Not on file  . Alcohol Use: No     Comment: occasionally  . Drug Use: No  . Sexual Activity: No   Other Topics Concern  . Not on file   Social History Narrative  . No narrative on file    Review of Systems  Constitutional: As in history of present illness HENT: Negative for ear pain, nosebleeds, congestion, facial swelling, rhinorrhea, neck pain, neck stiffness and ear discharge.   Eyes: Negative for pain, discharge, redness, itching and visual disturbance.  Respiratory: Negative for cough, choking, chest tightness, shortness of breath, wheezing and stridor.   Cardiovascular: Negative for chest pain, palpitations and leg swelling.  Gastrointestinal: As in history of present illness Genitourinary: Negative for dysuria, urgency, frequency, hematuria, flank pain, decreased urine volume, difficulty urinating and dyspareunia.  Musculoskeletal: Negative for back pain, joint swelling, arthralgias  and gait problem.  Neurological: Negative for dizziness, tremors, seizures, syncope, facial asymmetry, speech difficulty, weakness, light-headedness, numbness and headaches.  Hematological: Negative for adenopathy. Does not bruise/bleed easily.  Psychiatric/Behavioral: Negative for hallucinations, behavioral problems, confusion, dysphoric mood, decreased concentration and agitation.    Objective:   Filed Vitals:   11/13/13 1527  BP: 128/84  Pulse: 82  Temp: 98.9 F (37.2 C)  Resp: 14    Physical Exam   Constitutional: Appears well-developed and well-nourished. No distress.  HENT: Normocephalic. External right and left ear normal. Oropharynx is clear and moist.  Eyes: Conjunctivae and EOM are normal. PERRLA, no scleral icterus.  Neck: Normal ROM. Neck supple. No JVD. No tracheal deviation. No thyromegaly.  CVS: RRR, S1/S2 +, no murmurs, no gallops, no carotid bruit.  Pulmonary: Effort and breath sounds normal, no stridor, rhonchi, wheezes, rales.  Abdominal: Soft. BS +,  no distension, left lower quadrant tenderness, rebound or guarding.  Musculoskeletal: Normal range of motion. No edema and no tenderness.  Lymphadenopathy: No lymphadenopathy noted, cervical, inguinal. Neuro: Alert. Normal reflexes, muscle tone coordination. No cranial nerve deficit. Skin: Skin is warm and dry. No rash noted. Not diaphoretic. No erythema. No pallor.  Psychiatric: Normal mood and affect. Behavior, judgment, thought content normal.   Lab Results  Component Value Date   WBC 7.9 10/09/2013   HGB 11.5* 10/09/2013   HCT 34.0* 10/09/2013   MCV 93.2 10/09/2013   PLT 392 10/09/2013   Lab Results  Component Value Date   CREATININE 0.59 10/09/2013   BUN 9 10/09/2013   NA 134* 10/09/2013   K 4.2 10/09/2013   CL 100 10/09/2013   CO2 26 10/09/2013    No results found for this basename: HGBA1C   Lipid Panel  No results found for this basename: chol, trig, hdl, cholhdl, vldl, ldlcalc       Assessment and plan:   There are no active problems to display for this patient.  Pelvic pain/left lower quadrant pain   Imaging studies as described above Patient needs to see a gynecologist, for possible laparoscopy  She still has her menstrual cycle, 1 pregnancy test was negative and repeat Also rule out chlamydia/gonorrhea in order to rule out PID CEA/CA 125 tumor markers CBC to rule out leukocytosis Referral to gynecology for possible laparoscopy  Followup in 2-3 months    The patient was given clear  instructions to go to ER or return to medical center if symptoms don't improve, worsen or new problems develop. The patient verbalized understanding. The patient was told to call to get any lab results if not heard anything in the next week.

## 2013-11-13 NOTE — Progress Notes (Signed)
Pt is here to establish care and hospital f/u for pelvic pain. Pt suffers from anxiety.

## 2013-11-17 ENCOUNTER — Encounter (HOSPITAL_COMMUNITY): Payer: Self-pay | Admitting: Emergency Medicine

## 2013-11-17 ENCOUNTER — Emergency Department (HOSPITAL_COMMUNITY)
Admission: EM | Admit: 2013-11-17 | Discharge: 2013-11-17 | Disposition: A | Discharge status: Home / Self Care | Payer: Self-pay | Attending: Emergency Medicine | Admitting: Emergency Medicine

## 2013-11-17 ENCOUNTER — Ambulatory Visit: Payer: Self-pay | Admitting: Medical

## 2013-11-17 ENCOUNTER — Telehealth: Payer: Self-pay | Admitting: Medical

## 2013-11-17 ENCOUNTER — Ambulatory Visit: Payer: No Typology Code available for payment source | Attending: Internal Medicine

## 2013-11-17 ENCOUNTER — Telehealth: Payer: Self-pay | Admitting: Family Medicine

## 2013-11-17 DIAGNOSIS — Z3202 Encounter for pregnancy test, result negative: Secondary | ICD-10-CM | POA: Insufficient documentation

## 2013-11-17 DIAGNOSIS — N949 Unspecified condition associated with female genital organs and menstrual cycle: Secondary | ICD-10-CM | POA: Insufficient documentation

## 2013-11-17 DIAGNOSIS — Z8659 Personal history of other mental and behavioral disorders: Secondary | ICD-10-CM | POA: Insufficient documentation

## 2013-11-17 DIAGNOSIS — R102 Pelvic and perineal pain: Secondary | ICD-10-CM

## 2013-11-17 HISTORY — DX: Unspecified ovarian cyst, unspecified side: N83.209

## 2013-11-17 LAB — URINALYSIS, ROUTINE W REFLEX MICROSCOPIC
BILIRUBIN URINE: NEGATIVE
Glucose, UA: NEGATIVE mg/dL
KETONES UR: 40 mg/dL — AB
Leukocytes, UA: NEGATIVE
NITRITE: NEGATIVE
Protein, ur: NEGATIVE mg/dL
Specific Gravity, Urine: 1.016 (ref 1.005–1.030)
Urobilinogen, UA: 0.2 mg/dL (ref 0.0–1.0)
pH: 6 (ref 5.0–8.0)

## 2013-11-17 LAB — COMPREHENSIVE METABOLIC PANEL
ALK PHOS: 76 U/L (ref 39–117)
ALT: 8 U/L (ref 0–35)
AST: 26 U/L (ref 0–37)
Albumin: 4.1 g/dL (ref 3.5–5.2)
BILIRUBIN TOTAL: 0.3 mg/dL (ref 0.3–1.2)
BUN: 10 mg/dL (ref 6–23)
CO2: 22 meq/L (ref 19–32)
Calcium: 9.2 mg/dL (ref 8.4–10.5)
Chloride: 101 mEq/L (ref 96–112)
Creatinine, Ser: 0.54 mg/dL (ref 0.50–1.10)
GLUCOSE: 77 mg/dL (ref 70–99)
POTASSIUM: 3.8 meq/L (ref 3.7–5.3)
SODIUM: 139 meq/L (ref 137–147)
Total Protein: 8.3 g/dL (ref 6.0–8.3)

## 2013-11-17 LAB — CBC WITH DIFFERENTIAL/PLATELET
Basophils Absolute: 0 10*3/uL (ref 0.0–0.1)
Basophils Relative: 0 % (ref 0–1)
EOS PCT: 2 % (ref 0–5)
Eosinophils Absolute: 0.1 10*3/uL (ref 0.0–0.7)
HCT: 36.6 % (ref 36.0–46.0)
Hemoglobin: 12.3 g/dL (ref 12.0–15.0)
LYMPHS ABS: 2.2 10*3/uL (ref 0.7–4.0)
LYMPHS PCT: 32 % (ref 12–46)
MCH: 31.1 pg (ref 26.0–34.0)
MCHC: 33.6 g/dL (ref 30.0–36.0)
MCV: 92.4 fL (ref 78.0–100.0)
Monocytes Absolute: 0.4 10*3/uL (ref 0.1–1.0)
Monocytes Relative: 6 % (ref 3–12)
NEUTROS PCT: 60 % (ref 43–77)
Neutro Abs: 4.1 10*3/uL (ref 1.7–7.7)
Platelets: 351 10*3/uL (ref 150–400)
RBC: 3.96 MIL/uL (ref 3.87–5.11)
RDW: 13.1 % (ref 11.5–15.5)
WBC: 6.9 10*3/uL (ref 4.0–10.5)

## 2013-11-17 LAB — URINE MICROSCOPIC-ADD ON

## 2013-11-17 LAB — WET PREP, GENITAL
TRICH WET PREP: NONE SEEN
YEAST WET PREP: NONE SEEN

## 2013-11-17 LAB — POCT PREGNANCY, URINE: Preg Test, Ur: NEGATIVE

## 2013-11-17 MED ORDER — OXYCODONE-ACETAMINOPHEN 5-325 MG PO TABS
2.0000 | ORAL_TABLET | Freq: Once | ORAL | Status: AC
  Administered 2013-11-17: 2 via ORAL
  Filled 2013-11-17: qty 2

## 2013-11-17 MED ORDER — OXYCODONE-ACETAMINOPHEN 5-325 MG PO TABS: 1.0000 | ORAL_TABLET | ORAL | Status: DC | PRN

## 2013-11-17 NOTE — ED Provider Notes (Signed)
CSN: [id number]     Arrival date & time 11/17/13  1309 History   First MD Initiated Contact with Patient 11/17/13 2018     Chief Complaint  Patient presents with  . Abdominal Pain    Patient is a 43 y.o. female presenting with abdominal pain. The history is provided by the patient.  Abdominal Pain Pain location:  LLQ Pain quality: aching   Pain severity:  Moderate Onset quality:  Gradual Duration: over a month ago. Timing:  Constant Progression:  Worsening Chronicity:  Recurrent Relieved by: pain meds. Worsened by:  Palpation Associated symptoms: no dysuria, no fever, no vaginal bleeding and no vaginal discharge   pt reports she has LLQ/pelvic pain for over a month It occurs everyday It has been worse in past several days due to lack of pain meds  No fever/vomiting  She reports she has h/o known ovarian cysts but is having difficulty following up as outpatient  Past Medical History  Diagnosis Date  . Anxiety 1996  . Insomnia   . Ovarian cyst    Past Surgical History  Procedure Laterality Date  . Cesarean section      x2   . Wisdom tooth extraction     Family History  Problem Relation Age of Onset  . Aneurysm Father     brain  . Hepatitis C Father   . Cancer Maternal Aunt     breast  . Cancer Maternal Uncle     prostate?  Marland Kitchen Alcohol abuse Maternal Grandmother   . Heart disease Paternal Grandmother   . Other Paternal Grandfather     unknown   History  Substance Use Topics  . Smoking status: Never Smoker   . Smokeless tobacco: Not on file  . Alcohol Use: No     Comment: occasionally   OB History   Grav Para Term Preterm Abortions TAB SAB Ect Mult Living                 Review of Systems  Constitutional: Negative for fever.  Gastrointestinal: Positive for abdominal pain.  Genitourinary: Negative for dysuria, vaginal bleeding and vaginal discharge.  All other systems reviewed and are negative.    Allergies  Citalopram  Home Medications  No  current outpatient prescriptions on file. BP 122/59  Pulse 86  Temp(Src) 98.4 F (36.9 C) (Oral)  Resp 18  Wt 148 lb (67.132 kg)  SpO2 100%  LMP 11/11/2013 Physical Exam CONSTITUTIONAL: Well developed/well nourished HEAD: Normocephalic/atraumatic EYES: EOMI/PERRL ENMT: Mucous membranes moist NECK: supple no meningeal signs SPINE:entire spine nontender CV: S1/S2 noted, no murmurs/rubs/gallops noted LUNGS: Lungs are clear to auscultation bilaterally, no apparent distress ABDOMEN: soft, mild LLQ tenderess, no rebound or guarding GU:no cva tenderness, no cmt, bilateral adnexal tenderness noted, no vaginal bleeding noted, chaperone present NEURO: Pt is awake/alert, moves all extremitiesx4 EXTREMITIES: pulses normal, full ROM SKIN: warm, color normal PSYCH: no abnormalities of mood noted  ED Course  Procedures (including critical care time) Pt with abnormal [place] that showed ovarian cysts last month and referred to GYN but unable to f/u as of yet due to insurance reasons She admits the pain worsened when her pain meds ran out She has not had recent pelvic exam Will perform pelvic and reassess  At discharge, pt felt improved She denies any concern for GC/chlamydia, will defer empiric treatment I advised need for outpatient management by OBGYN  I doubt acute abdominal process and I doubt acute ovarian torsion given history/exam Labs Review Labs  Reviewed  URINALYSIS, ROUTINE W REFLEX MICROSCOPIC - Abnormal; Notable for the following:    APPearance HAZY (*)    Hgb urine dipstick MODERATE (*)    Ketones, ur 40 (*)    All other components within normal limits  URINE MICROSCOPIC-ADD ON - Abnormal; Notable for the following:    Squamous Epithelial / LPF FEW (*)    All other components within normal limits  GC/CHLAMYDIA PROBE AMP  WET PREP, GENITAL  CBC WITH DIFFERENTIAL  COMPREHENSIVE METABOLIC PANEL  POCT PREGNANCY, URINE   Imaging Review No results found.  EKG Interpretation    None       MDM  No diagnosis found. Nursing notes including past medical history and social history reviewed and considered in documentation Labs/vital reviewed and considered Previous records reviewed and considered Narcotic database reviewed    Sharyon Cable, MD 11/18/13 [phone]

## 2013-11-17 NOTE — ED Notes (Signed)
Pt is here with LLQ pain that she has been having for a while related to ovarian cyst and reports pain is moving up abdominal area.  Pt has been unable to get in with GYN MD.  Pt has periods of nausea.

## 2013-11-17 NOTE — ED Notes (Signed)
Pt c/o pain in lower abdomen x1 month 9/10, also pain in upper abdomen x3 days, Pt just today got insurance, was here in December to see about the same problem, pt was diagnosed with ovarian cysts but was unable to see OBGYN because of insurance issues, Pt wanted to take care of this issue but isn't able to see MD because the OBGYN MD isnt taking new patients for 3 months, Pt [facility] or another MD office to take care of the cysts sooner

## 2013-11-17 NOTE — Telephone Encounter (Signed)
Also she didn't go to GYN appt due to she didn't have the money to be seen.   Discussed with Audelia Acton & he advised there is nothing we can do here.  She needs to be seen by GYN.  Advised pt of Shane's recommendations.  And she will keep appt for [facility] card today & see Clinic about GYN referral today

## 2013-11-17 NOTE — Telephone Encounter (Signed)
Done

## 2013-11-18 LAB — GC/CHLAMYDIA PROBE AMP
CT Probe RNA: NEGATIVE
GC Probe RNA: NEGATIVE

## 2013-12-08 ENCOUNTER — Telehealth: Payer: Self-pay | Admitting: Medical

## 2013-12-08 NOTE — Telephone Encounter (Signed)
Called pt. LM to contact other doctor office for Ambien refill.

## 2013-12-08 NOTE — Telephone Encounter (Signed)
Per chart, patient transferred to care to internal medicine or [facility].  See last office note.

## 2013-12-08 NOTE — Telephone Encounter (Signed)
Kimberly Bruce, here is the message since the pt called you

## 2013-12-09 ENCOUNTER — Other Ambulatory Visit: Payer: Self-pay | Admitting: Medical

## 2013-12-09 MED ORDER — ZOLPIDEM TARTRATE 10 MG PO TABS: 10.0000 mg | ORAL_TABLET | Freq: Every evening | ORAL | Status: DC | PRN

## 2013-12-25 ENCOUNTER — Encounter: Payer: Self-pay | Admitting: Obstetrics & Gynecology

## 2013-12-29 ENCOUNTER — Telehealth: Payer: Self-pay | Admitting: Medical

## 2013-12-29 NOTE — Telephone Encounter (Signed)
No, I have no results from gynecology

## 2013-12-29 NOTE — Telephone Encounter (Signed)
Please call  Wants to see if you received results from Dr. Ronita Hipps

## 2014-01-07 ENCOUNTER — Telehealth: Payer: Self-pay | Admitting: Medical

## 2014-01-07 NOTE — Telephone Encounter (Signed)
Needs OV.  

## 2014-01-08 NOTE — Telephone Encounter (Signed)
Pt made appt for Fri 3/19

## 2014-01-09 ENCOUNTER — Encounter: Payer: Self-pay | Admitting: Medical

## 2014-01-09 ENCOUNTER — Ambulatory Visit: Payer: Self-pay | Admitting: Internal Medicine

## 2014-01-09 ENCOUNTER — Ambulatory Visit (INDEPENDENT_AMBULATORY_CARE_PROVIDER_SITE_OTHER): Payer: BC Managed Care – PPO | Admitting: Medical

## 2014-01-09 VITALS — BP 110/70 | HR 62 | Temp 98.6°F | Resp 16 | Wt 141.0 lb

## 2014-01-09 DIAGNOSIS — N83209 Unspecified ovarian cyst, unspecified side: Secondary | ICD-10-CM

## 2014-01-09 DIAGNOSIS — F411 Generalized anxiety disorder: Secondary | ICD-10-CM

## 2014-01-09 DIAGNOSIS — G4700 Insomnia, unspecified: Secondary | ICD-10-CM

## 2014-01-09 MED ORDER — LORAZEPAM 1 MG PO TABS: 1.0000 mg | ORAL_TABLET | Freq: Three times a day (TID) | ORAL | Status: DC

## 2014-01-09 MED ORDER — ZOLPIDEM TARTRATE 10 MG PO TABS: 10.0000 mg | ORAL_TABLET | Freq: Every evening | ORAL | Status: DC | PRN

## 2014-01-09 MED ORDER — SERTRALINE HCL 100 MG PO TABS: ORAL_TABLET | ORAL | Status: DC
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
## 2014-01-12 ENCOUNTER — Telehealth: Payer: Self-pay | Admitting: Medical

## 2014-01-12 NOTE — Telephone Encounter (Signed)
Let her know that I spoke to Tanda Rockers her counselor.  He gave me good insight on her history of sleep and anxiety issues, PTSD.  He did give me some feedback on her medication regimen.  He felt like her current medications are fine.  Thus, I do want to see how she does back on Zoloft once daily, and at this time she can continue to use Ativan when necessary for anxiety attacks, and if needed can use sleep aid such as these zolpidem or over-the-counter remedies such as Benadryl or melatonin.  Continue to use counseling, exercise, prayer, and reflection to cope with the things that she faces.  I again encourage her to call Dr. Kennith Maes nurse if she has other questions and concerns.  I hope things go well for her upcoming surgery.    I would like her to call back in 3-4 weeks to let me know how things are going

## 2014-01-12 NOTE — Telephone Encounter (Signed)
Patient is aware of Shane Tysinger PAC message in detail. CLS 

## 2014-01-12 NOTE — Telephone Encounter (Signed)
LMOM TO CB. CLS 

## 2014-01-14 ENCOUNTER — Telehealth: Payer: Self-pay | Admitting: Family Medicine

## 2014-01-14 NOTE — Telephone Encounter (Signed)
Patient called about her medications. She states that she takes a half of Ativan when she gets off work and then she takes a whole tablet along with 2 Hydrocodone. I explain to her that, that is to much medication to be taking at one time and that's the reason she still feels the effects of this the next day because she is still sedated. I explained all of this to [facility] and he said that she will have to cut back on the pain medication or the Ativan but she can't take all of that at the same time. I went over over with her that if she needs the pain medication that she will have to cut out the Ativan. She said she understood and that she will cut out the Ativan at night. CLS

## 2014-01-16 ENCOUNTER — Other Ambulatory Visit: Payer: Self-pay | Admitting: Obstetrics and Gynecology

## 2014-01-27 ENCOUNTER — Telehealth: Payer: Self-pay | Admitting: Medical

## 2014-01-27 ENCOUNTER — Encounter (HOSPITAL_COMMUNITY): Payer: Self-pay | Admitting: Pharmacist

## 2014-01-27 ENCOUNTER — Encounter (HOSPITAL_COMMUNITY): Payer: Self-pay | Admitting: *Deleted

## 2014-01-27 NOTE — Telephone Encounter (Signed)
You can give her samples of Belsomra 10mg  (3 pack as a trial) instead of Ambien.  I thought per last visit that Ambien was working or helpful?

## 2014-01-27 NOTE — Telephone Encounter (Signed)
Patient is aware of her samples up front. CLS  She states that she didn't want to take Ambien and the Hydrocodone together. CLS

## 2014-01-29 NOTE — H&P (Signed)
Kimberly Bruce, ATAYDE               ACCOUNT NO.:  [id number]  MEDICAL RECORD NO.:  [id number]  LOCATION:  [facility]                         FACILITY:  [facility]  PHYSICIAN:  Lovenia Kim, M.D.DATE OF BIRTH:  1971/07/08  DATE OF ADMISSION:  01/30/2014 DATE OF DISCHARGE:                             HISTORY & PHYSICAL   CHIEF COMPLAINT:  Pelvic pain.  HISTORY OF PRESENT ILLNESS:  The patient is a 43 year old African American female, G4, P2, with approximately 1-2 months history of pelvic Pain, sono c/w questionable pelvic mass, left hydrosalpinx, and now presents for surgical evaluation and definitive therapy.  She whishes to proceed with as conservative therapy as possible.  She has no known drug allergies.  Medications are hydrocodone.  She has a family history of breast cancer, diabetes, heart disease, chronic hypertension, history of four previous pregnancies, two C- sections, two vaginal deliveries.  No other surgical or medical hospitalizations.  PHYSICAL EXAMINATION:  GENERAL:  She is a well-developed, well- nourished, African American female in no acute distress. HEENT:  Normal. NECK:  Supple.  Full range of motion. LUNGS:  Clear. HEART:  Regular rate and rhythm. ABDOMEN:  Soft, nontender. PELVIC:  Diffusely tender, no mass.  IMPRESSION:  Pelvic pain, questionable etiology.  Ultrasound c/w hydrosalpinx.  PLAN:  Proceed with diagnostic laparoscopy, possible da Vinci-assisted laparoscopic lysis of adhesions, possible salpingectomy.  Risks of anesthesia, infection, bleeding, injury to surrounding organs, possible need for repair was discussed.  Delayed versus immediate complications to include bowel and bladder injury noted.  Inability to cure all type of pain has been discussed.  Possible need for ovarian and/or tube removal has been noted.  The patient's consents were signed, acknowledges and wishes to proceed.     Lovenia Kim, M.D.     RJT/MEDQ  D:   01/29/2014  T:  01/29/2014  Job:  [id number]

## 2014-01-30 ENCOUNTER — Encounter (HOSPITAL_COMMUNITY): Payer: Self-pay | Admitting: Anesthesiology

## 2014-01-30 ENCOUNTER — Encounter (HOSPITAL_COMMUNITY): Payer: BC Managed Care – PPO | Admitting: Anesthesiology

## 2014-01-30 ENCOUNTER — Ambulatory Visit (HOSPITAL_COMMUNITY)
Admission: RE | Admit: 2014-01-30 | Discharge: 2014-01-30 | Disposition: A | Discharge status: Home / Self Care | Payer: BC Managed Care – PPO | Source: Ambulatory Visit | Attending: Obstetrics and Gynecology | Admitting: Obstetrics and Gynecology

## 2014-01-30 ENCOUNTER — Ambulatory Visit (HOSPITAL_COMMUNITY): Payer: BC Managed Care – PPO | Admitting: Anesthesiology

## 2014-01-30 ENCOUNTER — Encounter (HOSPITAL_COMMUNITY)
Admission: RE | Disposition: A | Discharge status: Home / Self Care | Payer: Self-pay | Source: Ambulatory Visit | Attending: Obstetrics and Gynecology

## 2014-01-30 DIAGNOSIS — Z8249 Family history of ischemic heart disease and other diseases of the circulatory system: Secondary | ICD-10-CM | POA: Insufficient documentation

## 2014-01-30 DIAGNOSIS — Y9210 Unspecified residential institution as the place of occurrence of the external cause: Secondary | ICD-10-CM | POA: Insufficient documentation

## 2014-01-30 DIAGNOSIS — N736 Female pelvic peritoneal adhesions (postinfective): Secondary | ICD-10-CM | POA: Insufficient documentation

## 2014-01-30 DIAGNOSIS — R1032 Left lower quadrant pain: Secondary | ICD-10-CM | POA: Insufficient documentation

## 2014-01-30 DIAGNOSIS — Z833 Family history of diabetes mellitus: Secondary | ICD-10-CM | POA: Insufficient documentation

## 2014-01-30 DIAGNOSIS — N831 Corpus luteum cyst of ovary, unspecified side: Secondary | ICD-10-CM | POA: Insufficient documentation

## 2014-01-30 DIAGNOSIS — IMO0002 Reserved for concepts with insufficient information to code with codable children: Secondary | ICD-10-CM | POA: Insufficient documentation

## 2014-01-30 DIAGNOSIS — Z803 Family history of malignant neoplasm of breast: Secondary | ICD-10-CM | POA: Insufficient documentation

## 2014-01-30 DIAGNOSIS — F411 Generalized anxiety disorder: Secondary | ICD-10-CM | POA: Insufficient documentation

## 2014-01-30 DIAGNOSIS — N949 Unspecified condition associated with female genital organs and menstrual cycle: Secondary | ICD-10-CM | POA: Insufficient documentation

## 2014-01-30 DIAGNOSIS — N8300 Follicular cyst of ovary, unspecified side: Secondary | ICD-10-CM | POA: Insufficient documentation

## 2014-01-30 DIAGNOSIS — R102 Pelvic and perineal pain: Secondary | ICD-10-CM

## 2014-01-30 DIAGNOSIS — N7093 Salpingitis and oophoritis, unspecified: Secondary | ICD-10-CM | POA: Insufficient documentation

## 2014-01-30 DIAGNOSIS — S3760XA Unspecified injury of uterus, initial encounter: Secondary | ICD-10-CM | POA: Insufficient documentation

## 2014-01-30 HISTORY — PX: LAPAROSCOPY: SHX197

## 2014-01-30 HISTORY — PX: ROBOTIC ASSISTED LAPAROSCOPIC LYSIS OF ADHESION: SHX6080

## 2014-01-30 LAB — HCG, SERUM, QUALITATIVE: Preg, Serum: NEGATIVE

## 2014-01-30 LAB — CBC
HCT: 32.8 % — ABNORMAL LOW (ref 36.0–46.0)
Hemoglobin: 10.9 g/dL — ABNORMAL LOW (ref 12.0–15.0)
MCH: 31.2 pg (ref 26.0–34.0)
MCHC: 33.2 g/dL (ref 30.0–36.0)
MCV: 94 fL (ref 78.0–100.0)
Platelets: 415 10*3/uL — ABNORMAL HIGH (ref 150–400)
RBC: 3.49 MIL/uL — AB (ref 3.87–5.11)
RDW: 14.3 % (ref 11.5–15.5)
WBC: 4.8 10*3/uL (ref 4.0–10.5)

## 2014-01-30 SURGERY — LAPAROSCOPY, DIAGNOSTIC
Anesthesia: General | Site: Abdomen

## 2014-01-30 MED ORDER — ONDANSETRON HCL 4 MG/2ML IJ SOLN
INTRAMUSCULAR | Status: DC | PRN
  Administered 2014-01-30: 4 mg via INTRAVENOUS

## 2014-01-30 MED ORDER — ARTIFICIAL TEARS OP OINT
TOPICAL_OINTMENT | OPHTHALMIC | Status: DC | PRN
  Administered 2014-01-30: 1 via OPHTHALMIC

## 2014-01-30 MED ORDER — ONDANSETRON HCL 4 MG/2ML IJ SOLN
INTRAMUSCULAR | Status: AC
  Filled 2014-01-30: qty 2

## 2014-01-30 MED ORDER — STERILE WATER FOR IRRIGATION IR SOLN
Status: DC | PRN
  Administered 2014-01-30: 1000 mL

## 2014-01-30 MED ORDER — DEXAMETHASONE SODIUM PHOSPHATE 10 MG/ML IJ SOLN
INTRAMUSCULAR | Status: DC | PRN
  Administered 2014-01-30: 10 mg via INTRAVENOUS

## 2014-01-30 MED ORDER — MIDAZOLAM HCL 2 MG/2ML IJ SOLN
INTRAMUSCULAR | Status: DC | PRN
  Administered 2014-01-30: 2 mg via INTRAVENOUS

## 2014-01-30 MED ORDER — PROMETHAZINE HCL 25 MG/ML IJ SOLN: 6.2500 mg | INTRAMUSCULAR | Status: DC | PRN

## 2014-01-30 MED ORDER — LIDOCAINE HCL (CARDIAC) 20 MG/ML IV SOLN
INTRAVENOUS | Status: DC | PRN
  Administered 2014-01-30: 60 mg via INTRAVENOUS

## 2014-01-30 MED ORDER — FENTANYL CITRATE 0.05 MG/ML IJ SOLN
INTRAMUSCULAR | Status: AC
  Filled 2014-01-30: qty 2

## 2014-01-30 MED ORDER — PROPOFOL 10 MG/ML IV BOLUS
INTRAVENOUS | Status: DC | PRN
  Administered 2014-01-30: 160 mg via INTRAVENOUS

## 2014-01-30 MED ORDER — KETOROLAC TROMETHAMINE 30 MG/ML IJ SOLN
INTRAMUSCULAR | Status: AC
  Filled 2014-01-30: qty 1

## 2014-01-30 MED ORDER — BUPIVACAINE HCL (PF) 0.25 % IJ SOLN
INTRAMUSCULAR | Status: AC
  Filled 2014-01-30: qty 30

## 2014-01-30 MED ORDER — LIDOCAINE HCL (CARDIAC) 20 MG/ML IV SOLN
INTRAVENOUS | Status: AC
  Filled 2014-01-30: qty 5

## 2014-01-30 MED ORDER — NEOSTIGMINE METHYLSULFATE 1 MG/ML IJ SOLN
INTRAMUSCULAR | Status: AC
  Filled 2014-01-30: qty 1

## 2014-01-30 MED ORDER — PROPOFOL 10 MG/ML IV EMUL
INTRAVENOUS | Status: AC
  Filled 2014-01-30: qty 20

## 2014-01-30 MED ORDER — NEOSTIGMINE METHYLSULFATE 1 MG/ML IJ SOLN
INTRAMUSCULAR | Status: DC | PRN
  Administered 2014-01-30: 3 mg via INTRAVENOUS

## 2014-01-30 MED ORDER — FENTANYL CITRATE 0.05 MG/ML IJ SOLN
INTRAMUSCULAR | Status: AC
  Filled 2014-01-30: qty 5

## 2014-01-30 MED ORDER — FENTANYL CITRATE 0.05 MG/ML IJ SOLN
INTRAMUSCULAR | Status: DC | PRN
  Administered 2014-01-30: 25 ug via INTRAVENOUS
  Administered 2014-01-30: 100 ug via INTRAVENOUS
  Administered 2014-01-30: 25 ug via INTRAVENOUS
  Administered 2014-01-30: 50 ug via INTRAVENOUS
  Administered 2014-01-30 (×2): 25 ug via INTRAVENOUS
  Administered 2014-01-30: 100 ug via INTRAVENOUS

## 2014-01-30 MED ORDER — BUPIVACAINE HCL (PF) 0.25 % IJ SOLN
INTRAMUSCULAR | Status: DC | PRN
  Administered 2014-01-30: 10 mL

## 2014-01-30 MED ORDER — OXYCODONE HCL 5 MG PO TABS: 5.0000 mg | ORAL_TABLET | Freq: Once | ORAL | Status: DC | PRN

## 2014-01-30 MED ORDER — EPHEDRINE SULFATE 50 MG/ML IJ SOLN
INTRAMUSCULAR | Status: DC | PRN
  Administered 2014-01-30 (×2): 10 mg via INTRAVENOUS

## 2014-01-30 MED ORDER — KETOROLAC TROMETHAMINE 30 MG/ML IJ SOLN: 15.0000 mg | Freq: Once | INTRAMUSCULAR | Status: DC | PRN

## 2014-01-30 MED ORDER — OXYCODONE HCL 5 MG/5ML PO SOLN: 5.0000 mg | Freq: Once | ORAL | Status: DC | PRN

## 2014-01-30 MED ORDER — ACETAMINOPHEN 325 MG PO TABS: 325.0000 mg | ORAL_TABLET | ORAL | Status: DC | PRN

## 2014-01-30 MED ORDER — MIDAZOLAM HCL 2 MG/2ML IJ SOLN
INTRAMUSCULAR | Status: AC
  Filled 2014-01-30: qty 2

## 2014-01-30 MED ORDER — PHENYLEPHRINE HCL 10 MG/ML IJ SOLN
INTRAMUSCULAR | Status: DC | PRN
  Administered 2014-01-30: 40 ug via INTRAVENOUS
  Administered 2014-01-30: 80 ug via INTRAVENOUS
  Administered 2014-01-30: 40 ug via INTRAVENOUS
  Administered 2014-01-30 (×3): 80 ug via INTRAVENOUS

## 2014-01-30 MED ORDER — FENTANYL CITRATE 0.05 MG/ML IJ SOLN
25.0000 ug | INTRAMUSCULAR | Status: DC | PRN
Start: 2014-01-30 — End: 2014-01-30

## 2014-01-30 MED ORDER — GLYCOPYRROLATE 0.2 MG/ML IJ SOLN
INTRAMUSCULAR | Status: DC | PRN
  Administered 2014-01-30: 0.1 mg via INTRAVENOUS
  Administered 2014-01-30: 0.3 mg via INTRAVENOUS

## 2014-01-30 MED ORDER — ROPIVACAINE HCL 5 MG/ML IJ SOLN
INTRAMUSCULAR | Status: AC
  Filled 2014-01-30: qty 60

## 2014-01-30 MED ORDER — SODIUM CHLORIDE 0.9 % IJ SOLN
INTRAMUSCULAR | Status: AC
  Filled 2014-01-30: qty 10

## 2014-01-30 MED ORDER — KETOROLAC TROMETHAMINE 30 MG/ML IJ SOLN
INTRAMUSCULAR | Status: DC | PRN
  Administered 2014-01-30: 30 mg via INTRAVENOUS

## 2014-01-30 MED ORDER — LACTATED RINGERS IV SOLN
INTRAVENOUS | Status: DC
  Administered 2014-01-30 (×3): via INTRAVENOUS

## 2014-01-30 MED ORDER — HYDROMORPHONE HCL PF 1 MG/ML IJ SOLN
INTRAMUSCULAR | Status: AC
  Filled 2014-01-30: qty 1

## 2014-01-30 MED ORDER — CEFAZOLIN SODIUM-DEXTROSE 2-3 GM-% IV SOLR: 2.0000 g | INTRAVENOUS | Status: DC

## 2014-01-30 MED ORDER — ACETAMINOPHEN 160 MG/5ML PO SOLN: 325.0000 mg | ORAL | Status: DC | PRN

## 2014-01-30 MED ORDER — ROCURONIUM BROMIDE 100 MG/10ML IV SOLN
INTRAVENOUS | Status: DC | PRN
  Administered 2014-01-30: 30 mg via INTRAVENOUS
  Administered 2014-01-30: 20 mg via INTRAVENOUS

## 2014-01-30 MED ORDER — OXYCODONE-ACETAMINOPHEN 5-325 MG PO TABS: 1.0000 | ORAL_TABLET | ORAL | Status: DC | PRN

## 2014-01-30 MED ORDER — CEFAZOLIN SODIUM-DEXTROSE 2-3 GM-% IV SOLR
INTRAVENOUS | Status: AC
Start: 2014-01-30 — End: 2014-01-30
  Administered 2014-01-30: 2 g via INTRAVENOUS
  Filled 2014-01-30: qty 50

## 2014-01-30 MED ORDER — DEXAMETHASONE SODIUM PHOSPHATE 10 MG/ML IJ SOLN
INTRAMUSCULAR | Status: AC
  Filled 2014-01-30: qty 1

## 2014-01-30 MED ORDER — SODIUM CHLORIDE 0.9 % IJ SOLN
INTRAMUSCULAR | Status: AC
  Filled 2014-01-30: qty 50

## 2014-01-30 MED ORDER — ROPIVACAINE HCL 5 MG/ML IJ SOLN
INTRAMUSCULAR | Status: DC | PRN
  Administered 2014-01-30: 60 mL

## 2014-01-30 MED ORDER — LACTATED RINGERS IR SOLN
Status: DC | PRN
  Administered 2014-01-30: 3000 mL

## 2014-01-30 SURGICAL SUPPLY — 77 items
BAG URINE DRAINAGE (UROLOGICAL SUPPLIES) ×3 IMPLANT
BARRIER ADHS 3X4 INTERCEED (GAUZE/BANDAGES/DRESSINGS) ×3 IMPLANT
CABLE HIGH FREQUENCY MONO STRZ (ELECTRODE) ×3 IMPLANT
CATH FOLEY 3WAY  5CC 16FR (CATHETERS) ×2
CATH FOLEY 3WAY 5CC 16FR (CATHETERS) ×1 IMPLANT
CATH ROBINSON RED A/P 16FR (CATHETERS) IMPLANT
CHLORAPREP W/TINT 26ML (MISCELLANEOUS) ×3 IMPLANT
CLOTH BEACON ORANGE TIMEOUT ST (SAFETY) ×3 IMPLANT
CONT PATH 16OZ SNAP LID 3702 (MISCELLANEOUS) ×3 IMPLANT
COVER MAYO STAND STRL (DRAPES) ×3 IMPLANT
COVER TABLE BACK 60X90 (DRAPES) ×6 IMPLANT
COVER TIP SHEARS 8 DVNC (MISCELLANEOUS) ×1 IMPLANT
COVER TIP SHEARS 8MM DA VINCI (MISCELLANEOUS) ×2
DECANTER SPIKE VIAL GLASS SM (MISCELLANEOUS) ×3 IMPLANT
DERMABOND ADVANCED (GAUZE/BANDAGES/DRESSINGS) ×2
DERMABOND ADVANCED .7 DNX12 (GAUZE/BANDAGES/DRESSINGS) ×1 IMPLANT
DRAPE HUG U DISPOSABLE (DRAPE) ×3 IMPLANT
DRAPE LG THREE QUARTER DISP (DRAPES) ×6 IMPLANT
DRAPE WARM FLUID 44X44 (DRAPE) ×3 IMPLANT
ELECT REM PT RETURN 9FT ADLT (ELECTROSURGICAL) ×3
ELECTRODE REM PT RTRN 9FT ADLT (ELECTROSURGICAL) ×1 IMPLANT
EVACUATOR SMOKE 8.L (FILTER) ×3 IMPLANT
FORCEPS CUTTING 33CM 5MM (CUTTING FORCEPS) IMPLANT
FORCEPS CUTTING 45CM 5MM (CUTTING FORCEPS) IMPLANT
GAS CARTRIDGE (MEDICAL GASES) IMPLANT
GAUZE VASELINE 3X9 (GAUZE/BANDAGES/DRESSINGS) IMPLANT
GLOVE BIO SURGEON STRL SZ7.5 (GLOVE) ×6 IMPLANT
GOWN STRL REUS W/TWL LRG LVL3 (GOWN DISPOSABLE) ×21 IMPLANT
GYRUS RUMI II 2.5CM BLUE (DISPOSABLE)
GYRUS RUMI II 3.5CM BLUE (DISPOSABLE)
GYRUS RUMI II 4.0CM BLUE (DISPOSABLE)
KIT ACCESSORY DA VINCI DISP (KITS) ×2
KIT ACCESSORY DVNC DISP (KITS) ×1 IMPLANT
LEGGING LITHOTOMY PAIR STRL (DRAPES) ×3 IMPLANT
NEEDLE INSUFFLATION 120MM (ENDOMECHANICALS) ×3 IMPLANT
NEEDLE INSUFFLATION 150MM (ENDOMECHANICALS) ×3 IMPLANT
PACK LAPAROSCOPY BASIN (CUSTOM PROCEDURE TRAY) ×3 IMPLANT
PACK LAVH (CUSTOM PROCEDURE TRAY) ×3 IMPLANT
PAD PREP 24X48 CUFFED NSTRL (MISCELLANEOUS) ×6 IMPLANT
PLUG CATH AND CAP STER (CATHETERS) ×3 IMPLANT
POUCH SPECIMEN RETRIEVAL 10MM (ENDOMECHANICALS) ×3 IMPLANT
PROTECTOR NERVE ULNAR (MISCELLANEOUS) ×6 IMPLANT
RUMI II 3.0CM BLUE KOH-EFFICIE (DISPOSABLE) IMPLANT
RUMI II GYRUS 2.5CM BLUE (DISPOSABLE) IMPLANT
RUMI II GYRUS 3.5CM BLUE (DISPOSABLE) IMPLANT
RUMI II GYRUS 4.0CM BLUE (DISPOSABLE) IMPLANT
SCISSORS LAP 5X35 DISP (ENDOMECHANICALS) ×3 IMPLANT
SET CYSTO W/LG BORE CLAMP LF (SET/KITS/TRAYS/PACK) IMPLANT
SET IRRIG TUBING LAPAROSCOPIC (IRRIGATION / IRRIGATOR) ×3 IMPLANT
SOLUTION ELECTROLUBE (MISCELLANEOUS) ×3 IMPLANT
SUT VIC AB 0 CT1 27 (SUTURE) ×4
SUT VIC AB 0 CT1 27XBRD ANBCTR (SUTURE) ×2 IMPLANT
SUT VIC AB 0 CT1 27XBRD ANTBC (SUTURE) IMPLANT
SUT VICRYL 0 UR6 27IN ABS (SUTURE) ×3 IMPLANT
SUT VICRYL 4-0 PS2 18IN ABS (SUTURE) ×6 IMPLANT
SUT VICRYL RAPIDE 4/0 PS 2 (SUTURE) ×6 IMPLANT
SYR 50ML LL SCALE MARK (SYRINGE) ×3 IMPLANT
SYRINGE 10CC LL (SYRINGE) ×3 IMPLANT
SYSTEM CONVERTIBLE TROCAR (TROCAR) IMPLANT
TAPE UMBILICAL 1/8 X36 TWILL (MISCELLANEOUS) IMPLANT
TIP UTERINE 5.1X6CM LAV DISP (MISCELLANEOUS) IMPLANT
TIP UTERINE 6.7X10CM GRN DISP (MISCELLANEOUS) IMPLANT
TIP UTERINE 6.7X6CM WHT DISP (MISCELLANEOUS) IMPLANT
TIP UTERINE 6.7X8CM BLUE DISP (MISCELLANEOUS) ×3 IMPLANT
TOWEL OR 17X24 6PK STRL BLUE (TOWEL DISPOSABLE) ×9 IMPLANT
TRAY FOLEY CATH 14FR (SET/KITS/TRAYS/PACK) ×3 IMPLANT
TROCAR DISP BLADELESS 8 DVNC (TROCAR) ×1 IMPLANT
TROCAR DISP BLADELESS 8MM (TROCAR) ×2
TROCAR OPTI TIP 5M 100M (ENDOMECHANICALS) ×3 IMPLANT
TROCAR XCEL 12X100 BLDLESS (ENDOMECHANICALS) IMPLANT
TROCAR XCEL DIL TIP R 11M (ENDOMECHANICALS) ×3 IMPLANT
TROCAR XCEL NON-BLD 5MMX100MML (ENDOMECHANICALS) ×3 IMPLANT
TROCAR XCEL OPT SLVE 5M 100M (ENDOMECHANICALS) IMPLANT
TROCAR Z-THREAD 12X150 (TROCAR) ×3 IMPLANT
TUBING FILTER THERMOFLATOR (ELECTROSURGICAL) ×3 IMPLANT
WARMER LAPAROSCOPE (MISCELLANEOUS) ×3 IMPLANT
WATER STERILE IRR 1000ML POUR (IV SOLUTION) ×9 IMPLANT

## 2014-01-30 NOTE — Op Note (Signed)
[name], [name]               ACCOUNT NO.:  [id number]  MEDICAL RECORD NO.:  [id number]  LOCATION:  [facility]                          FACILITY:  [facility]  PHYSICIAN:  Lovenia Kim, M.D.DATE OF BIRTH:  [birth year]  DATE OF PROCEDURE: DATE OF DISCHARGE:  01/30/2014                              OPERATIVE REPORT   PREOPERATIVE DIAGNOSIS:  Pelvic pain with questionable left hydrosalpinx.  POSTOPERATIVE DIAGNOSES:  Severe pelvic adhesions with probable severe pelvic inflammatory disease, bilateral tubo-ovarian abscess, obliterated posterior cul-de-sac.  PROCEDURE:  Da Vinci-assisted laparoscopic extensive lysis of adhesions, left ureterolysis, excision and removal of left tubo-ovarian abscess, lysis of adhesions in the right adnexa and posterior cul-de-sac.  SURGEON:  Lovenia Kim, M.D.  ASSISTANT:  Dr. Drake Leach.  ANESTHESIA:  General.  ESTIMATED BLOOD LOSS:  About 50 mL.  COMPLICATIONS:  None.  DRAINS:  Foley.  COUNTS:  Correct.  DISPOSITION:  The patient to recovery in good condition.  BRIEF OPERATIVE NOTE:  After being apprised of the risks of anesthesia, infection, bleeding, injury to surrounding organs, possible need for repair, delayed versus immediate complications to include bowel and bladder injury, possible need for repair, the patient was brought to the operating room, where she was administered general anesthetic, prepped and draped in usual sterile fashion.  Foley catheter placed, clear urine noted.  Exam under anesthesia revealed a retroflexed fixed uterus and no left and right adnexal fullness.  No cul-de-sac nodularity.  At this time, RUMI retractor was placed without difficulty.  An infraumbilical incision was made.  Veress needle was placed, opening pressure of -2, 3 liters of CO2 insufflated without difficulty.  A 15-mm trocar placed atraumatically.  Visualization revealed normal liver and gallbladder bed, normal diaphragm, normal appendix.  There  were adhesions to the anterior abdominal wall.  There were cul-de-sac adhesions.  There was inability to visualize any normal structure on the left or right side, a large left hydrosalpinx and tubo-ovarian abscess were appreciated, similarly a smaller structure was apparent on the right.  The patient preferred conservative approach.  The decision was made to proceed robotically.  There were two robotic ports placed on the right after achieving deep Trendelenburg position, one on the left with a 5-mm port on the left.  At this time, PK forceps, Endo-Shears and progressed were entered to the robotic ports and the robotic portion of the procedure was initiated whereby the cul-de-sac or the uterus was slightly anteflexed.  Upon slight anti-flexion, there was a small midline posterior wall perforation noted from the RUMI retractor that was not present before trying to flex the uterus.  This was without bleeding. The balloon of the RUMI was deflated and there was just a small perforation noted.  The uterus was then kept in this position while the posterior cul-de-sac was better defined.  Bowel adhesions of the sigmoid colon to the left adnexa were lysed sharply.  The retroperitoneal space was then entered on the left.  The retroperitoneal space was further developed exposing the ureter along the medial leaf of the peritoneum. Due to the fact that the patient's pain was on the left side and there was a large left tubo-ovarian abscess, decision was made  to remove the left adnexa whereby the tubo-ovarian ligament was clamped and divided. The infundibulopelvic ligament was skeletonized and cauterized in the 3- point method and divided.  Upon elevating this mass, the ureters found extending into the tubo-ovarian abscess.  The ureter was then dissected in its entirety along the medial leaf peritoneum off of the mass until the mass was freed at the level where the ureter comes close to the uterine  artery.  Good ureteral peristalsis was noted.  Minimal bleeding was noted.  The specimen was completely detached and placed in the anterior cul-de-sac.  Lysis of adhesions performed in the right cul-de- sac, but no definitive procedure due to the patient's desires and lack of right lower quadrant pain.  Good hemostasis was achieved.  The RUMI retractor was removed.  Interceed was placed in the posterior cul-de-sac and the robotic portion of the procedure was terminated. Laparoscopically, the 8-mm scope was placed.  The specimen was then placed and an EndoCatch removed through the 15-mm port without difficulty.  All instruments and trocars were then removed under direct visualization.  CO2 was released.  Positive pressure was applied. Incisions were closed using 0 Vicryl, 4-0 Vicryl, and Dermabond.  The patient tolerated the procedure well, was awakened, and transferred to recovery in good condition.     Lovenia Kim, M.D.     RJT/MEDQ  D:  01/30/2014  T:  01/30/2014  Job:  [id number]

## 2014-01-30 NOTE — Op Note (Signed)
01/30/2014  3:24 PM  PATIENT:  Kimberly Bruce  43 y.o. female  PRE-OPERATIVE DIAGNOSIS:  Left Adnexal Mass, Left Lower Quadrant Pain  POST-OPERATIVE DIAGNOSIS:  left adnexal Mass,Left lower ouadrant pain BILATERAL TOA SEVERE PELVIC ADHESIONS  PROCEDURE:  Procedure(s): LAPAROSCOPY DIAGNOSTIC Possible ROBOTIC ASSISTED LAPAROSCOPIC  LYSIS OF ADHESIONS(EXTENSIVE)  LEFT SALPINGO OOPHORECTOMY LEFT URETEROLYSIS  SURGEON:  Surgeon(s): Lovenia Kim, MD  ASSISTANTSDrake Leach, CNM   ANESTHESIA:   local and general  ESTIMATED BLOOD LOSS: 50CC  DRAINS: Urinary Catheter (Foley)   LOCAL MEDICATIONS USED:  MARCAINE    and Amount: 10 ml  SPECIMEN:  Source of Specimen:  LEFT TUBE AND OVARY  DISPOSITION OF SPECIMEN:  PATHOLOGY  COUNTS:  YES  DICTATION #: [id number]  PLAN OF CARE: DC HOME  PATIENT DISPOSITION:  PACU - hemodynamically stable.

## 2014-01-30 NOTE — Anesthesia Procedure Notes (Signed)
Procedure Name: Intubation Date/Time: 01/30/2014 1:20 PM Performed by: Flossie Dibble Pre-anesthesia Checklist: Patient identified, Timeout performed, Patient being monitored, Suction available and Emergency Drugs available Patient Re-evaluated:Patient Re-evaluated prior to inductionOxygen Delivery Method: Circle system utilized Preoxygenation: Pre-oxygenation with 100% oxygen Intubation Type: IV induction Ventilation: Mask ventilation without difficulty Laryngoscope Size: Mac and 3 Grade View: Grade I Tube type: Oral Tube size: 6.0 mm Number of attempts: 1 Airway Equipment and Method: Stylet Placement Confirmation: ETT inserted through vocal cords under direct vision,  breath sounds checked- equal and bilateral and positive ETCO2 Secured at: 21 cm Tube secured with: Tape Dental Injury: Teeth and Oropharynx as per pre-operative assessment

## 2014-01-30 NOTE — Progress Notes (Signed)
Patient ID: Kimberly Bruce, female   DOB: 1971-08-29, 43 y.o.   MRN: [id number] Patient seen and examined. Consent witnessed and signed. No changes noted. Update completed.

## 2014-01-30 NOTE — Discharge Instructions (Signed)
DISCHARGE INSTRUCTIONS: Laparoscopy  The following instructions have been prepared to help you care for yourself upon your return home today.  No Ibuprofen products (ie. Advil, Aleve, Motrin, etc.) until after 9:30 pm tonight.  Wound care:  Do not get the incisions wet for the first 24 hours. The incisions should be kept clean and dry.  Should the incision sites  become sore, red, and swollen after the first week, check with your doctor.  Personal hygiene:  Shower the day after your procedure.  Activity and limitations:  Do NOT drive or operate any equipment today.  Do NOT lift anything more than 15 pounds for 2-3 weeks after surgery.  Do NOT rest in bed all day.  Walking is encouraged. Walk each day, starting slowly with 5-minute walks 3 or 4 times a day. Slowly increase the length of your walks.  Walk up and down stairs slowly.  Do NOT do strenuous activities, such as golfing, playing tennis, bowling, running, biking, weight lifting, gardening, mowing, or vacuuming for 2-4 weeks. Ask your doctor when it is okay to start.  Diet: Eat a light meal as desired this evening. You may resume your usual diet tomorrow.  Return to work: This is dependent on the type of work you do. For the most part you can return to a desk job within a week of surgery. If you are more active at work, please discuss this with your doctor.  What to expect after your surgery: You may have a slight burning sensation when you urinate on the first day. You may have a very small amount of blood in the urine. Expect to have a small amount of vaginal discharge/light bleeding for 1-2 weeks. It is not unusual to have abdominal soreness and bruising for up to 2 weeks. You may be tired and need more rest for about 1 week. You may experience shoulder pain for 24-72 hours. Lying flat in bed may relieve it.  Call your doctor for any of the following:  Develop a fever of 100.4 or greater  Inability to urinate 6 hours  after discharge from hospital  Severe pain not relieved by pain medications  Persistent of heavy bleeding at incision site  Redness or swelling around incision site after a week  Increasing nausea or vomiting  Patient Signature________________________________________ Nurse Signature_________________________________________

## 2014-01-30 NOTE — Transfer of Care (Signed)
Immediate Anesthesia Transfer of Care Note  Patient: Kimberly Bruce  Procedure(s) Performed: Procedure(s): LAPAROSCOPY DIAGNOSTIC (N/A) Possible ROBOTIC ASSISTED LAPAROSCOPIC LYSIS OF ADHESION WITH LEFT SALPINGO OOPHORECTOMY (N/A)  Patient Location: PACU  Anesthesia Type:General  Level of Consciousness: awake  Airway & Oxygen Therapy: Patient Spontanous Breathing  Post-op Assessment: Report given to PACU RN  Post vital signs: stable  Filed Vitals:   01/30/14 1141  BP: 104/65  Pulse: 93  Temp: 37 C  Resp: 18    Complications: No apparent anesthesia complications

## 2014-01-30 NOTE — Anesthesia Preprocedure Evaluation (Signed)
Anesthesia Evaluation  Patient identified by MRN, date of birth, ID band Patient awake    Reviewed: Allergy & Precautions, H&P , NPO status , Patient's Chart, lab work & pertinent test results  History of Anesthesia Complications Negative for: history of anesthetic complications  Airway Mallampati: II TM Distance: >3 FB Neck ROM: Full    Dental  (+) Teeth Intact,    Pulmonary neg pulmonary ROS,  breath sounds clear to auscultation        Cardiovascular negative cardio ROS  Rhythm:Regular     Neuro/Psych Anxiety negative neurological ROS     GI/Hepatic negative GI ROS, Neg liver ROS,   Endo/Other  negative endocrine ROS  Renal/GU negative Renal ROS     Musculoskeletal negative musculoskeletal ROS (+)   Abdominal   Peds  Hematology negative hematology ROS (+)   Anesthesia Other Findings   Reproductive/Obstetrics                           Anesthesia Physical Anesthesia Plan  ASA: I  Anesthesia Plan: General   Post-op Pain Management:    Induction: Intravenous  Airway Management Planned: Oral ETT  Additional Equipment: None  Intra-op Plan:   Post-operative Plan: Extubation in OR  Informed Consent: I have reviewed the patients History and Physical, chart, labs and discussed the procedure including the risks, benefits and alternatives for the proposed anesthesia with the patient or authorized representative who has indicated his/her understanding and acceptance.   Dental advisory given  Plan Discussed with: CRNA and Surgeon  Anesthesia Plan Comments:         Anesthesia Quick Evaluation

## 2014-02-01 NOTE — Anesthesia Postprocedure Evaluation (Signed)
  Anesthesia Post-op Note  Patient: Kimberly Bruce  Procedure(s) Performed: Procedure(s): LAPAROSCOPY DIAGNOSTIC (N/A) Possible ROBOTIC ASSISTED LAPAROSCOPIC LYSIS OF ADHESION WITH LEFT SALPINGO OOPHORECTOMY (N/A)  Patient Location: PACU  Anesthesia Type:General  Level of Consciousness: awake and alert   Airway and Oxygen Therapy: Patient Spontanous Breathing  Post-op Pain: mild  Post-op Assessment: Post-op Vital signs reviewed, Patient's Cardiovascular Status Stable, Respiratory Function Stable, Patent Airway, No signs of Nausea or vomiting and Pain level controlled  Post-op Vital Signs: Reviewed and stable  Last Vitals:  Filed Vitals:   01/30/14 1725  BP: 117/64  Pulse: 85  Temp:   Resp: 16    Complications: No apparent anesthesia complications

## 2014-02-02 ENCOUNTER — Encounter (HOSPITAL_COMMUNITY): Payer: Self-pay | Admitting: Obstetrics and Gynecology

## 2014-02-03 ENCOUNTER — Telehealth: Payer: Self-pay | Admitting: Internal Medicine

## 2014-02-03 NOTE — Telephone Encounter (Signed)
Pt needs a refill on her ambien 10mg  to [facility] [address]

## 2014-02-03 NOTE — Telephone Encounter (Signed)
There is no way to get in touch with pt due to no phone access. Please call into pharmacy

## 2014-02-04 ENCOUNTER — Other Ambulatory Visit: Payer: Self-pay | Admitting: Medical

## 2014-02-04 MED ORDER — ZOLPIDEM TARTRATE 10 MG PO TABS: 10.0000 mg | ORAL_TABLET | Freq: Every evening | ORAL | Status: DC | PRN

## 2014-02-04 NOTE — Telephone Encounter (Signed)
I never got feedback on the [place]man.  How did this sleep aid work compared to Ambien?

## 2014-02-04 NOTE — Telephone Encounter (Signed)
pls call out [facility] with 2 refills

## 2014-02-04 NOTE — Telephone Encounter (Signed)
Did you get this message

## 2014-02-04 NOTE — Telephone Encounter (Signed)
Pt states that the [place] did not work for her and she would rather have Ambien 10mg  called in to [facility] on [address]

## 2014-02-04 NOTE — Telephone Encounter (Signed)
Kimberly Bruce, did you get this message. CLS

## 2014-02-04 NOTE — Telephone Encounter (Signed)
I called out Ambien to the patients pharmacy per David Tysinger PA-C. CLS 

## 2014-03-25 ENCOUNTER — Other Ambulatory Visit: Payer: Self-pay | Admitting: Family Medicine

## 2014-03-25 ENCOUNTER — Telehealth: Payer: Self-pay | Admitting: Medical

## 2014-03-25 MED ORDER — LORAZEPAM 1 MG PO TABS: 1.0000 mg | ORAL_TABLET | Freq: Three times a day (TID) | ORAL | Status: DC

## 2014-03-25 NOTE — Telephone Encounter (Signed)
I called out Ativan to the patients pharmacy per Chana Bode PAC. CLS

## 2014-03-25 NOTE — Telephone Encounter (Signed)
Pt scheduled a cpe for the end of June. She is requesting refill on Ativan sent to [facility] on [address].

## 2014-03-25 NOTE — Telephone Encounter (Signed)
Refill x1 

## 2014-04-16 ENCOUNTER — Encounter: Payer: Self-pay | Admitting: Medical

## 2014-04-16 ENCOUNTER — Ambulatory Visit (INDEPENDENT_AMBULATORY_CARE_PROVIDER_SITE_OTHER): Payer: BC Managed Care – PPO | Admitting: Medical

## 2014-04-16 ENCOUNTER — Telehealth: Payer: Self-pay | Admitting: Medical

## 2014-04-16 VITALS — BP 100/80 | HR 92 | Temp 98.3°F | Resp 16 | Ht 61.0 in | Wt 127.0 lb

## 2014-04-16 DIAGNOSIS — Z1239 Encounter for other screening for malignant neoplasm of breast: Secondary | ICD-10-CM

## 2014-04-16 DIAGNOSIS — Z23 Encounter for immunization: Secondary | ICD-10-CM

## 2014-04-16 DIAGNOSIS — Z0000 Encounter for general adult medical examination without abnormal findings: Secondary | ICD-10-CM

## 2014-04-16 LAB — CBC
HEMATOCRIT: 33.4 % — AB (ref 36.0–46.0)
Hemoglobin: 11 g/dL — ABNORMAL LOW (ref 12.0–15.0)
MCH: 30.4 pg (ref 26.0–34.0)
MCHC: 32.9 g/dL (ref 30.0–36.0)
MCV: 92.3 fL (ref 78.0–100.0)
Platelets: 482 10*3/uL — ABNORMAL HIGH (ref 150–400)
RBC: 3.62 MIL/uL — ABNORMAL LOW (ref 3.87–5.11)
RDW: 12.8 % (ref 11.5–15.5)
WBC: 5.2 10*3/uL (ref 4.0–10.5)

## 2014-04-16 LAB — POCT URINALYSIS DIPSTICK
Bilirubin, UA: NEGATIVE
GLUCOSE UA: NEGATIVE
KETONES UA: NEGATIVE
LEUKOCYTES UA: NEGATIVE
NITRITE UA: NEGATIVE
PH UA: 8
Spec Grav, UA: <=1.005
Urobilinogen, UA: NEGATIVE

## 2014-04-16 NOTE — Telephone Encounter (Signed)
Refer for mammogram, screening

## 2014-04-16 NOTE — Addendum Note (Signed)
Addended by: Armanda Magic on: 04/16/2014 03:12 PM   Modules accepted: Orders

## 2014-04-16 NOTE — Progress Notes (Signed)
Subjective:   HPI  Kimberly Bruce is a 43 y.o. female who presents for a complete physical.   Preventative care: Last ophthalmology visit:n/a Last dental visit:n/a Last colonoscopy:n/a Last mammogram:05/06 Last gynecological 10/2013 Last EKG:n/a Last labs:?  Prior vaccinations: TD or Tdap:unsure Influenza:n/a Pneumococcal:n/a Shingles/Zostavax:n/a  Advanced directive:n/a Health care power of attorney:n/a Living will:n/a  Concerns: None, had gyn surgery in April for pelvic pain, tubo-ovarian abscess, lesions.  Reviewed their medical, surgical, family, social, medication, and allergy history and updated chart as appropriate.  Past Medical History  Diagnosis Date  . Anxiety 1996  . Insomnia   . Ovarian cyst   . Anemia     since teenage years    Past Surgical History  Procedure Laterality Date  . Cesarean section      x2   . Wisdom tooth extraction    . Laparoscopy N/A 01/30/2014    Procedure: LAPAROSCOPY DIAGNOSTIC;  Surgeon: Lovenia Kim, MD;  Location: [facility];  Service: Gynecology;  Laterality: N/A;  . Robotic assisted laparoscopic lysis of adhesion N/A 01/30/2014    Procedure: Possible ROBOTIC ASSISTED LAPAROSCOPIC LYSIS OF ADHESION WITH LEFT SALPINGO OOPHORECTOMY;  Surgeon: Lovenia Kim, MD;  Location: [facility];  Service: Gynecology;  Laterality: N/A;    History   Social History  . Marital Status: Single    Spouse Name: N/A    Number of Children: N/A  . Years of Education: N/A   Occupational History  . Not on file.   Social History Main Topics  . Smoking status: Never Smoker   . Smokeless tobacco: Not on file  . Alcohol Use: No     Comment: occasionally  . Drug Use: No  . Sexual Activity: No   Other Topics Concern  . Not on file   Social History Narrative   Her 55yo daughter [name] lives with her, [name], works as Employment IT sales professional at [employer], exercising 5 days per week.  Likes to do kickboxing.   In relationship 2 years.     Family History  Problem Relation Age of Onset  . Aneurysm Father     brain  . Hepatitis C Father   . Cancer Maternal Aunt     breast  . Cancer Maternal Uncle     prostate?  Marland Kitchen Alcohol abuse Maternal Grandmother   . Heart disease Paternal Grandmother   . Other Paternal Grandfather     unknown    Current outpatient prescriptions:LORazepam (ATIVAN) 1 MG tablet, Take 1 tablet (1 mg total) by mouth every 8 (eight) hours., Disp: 30 tablet, Rfl: 1;  Prenatal Vit-Fe Fumarate-FA (MULTIVITAMIN-PRENATAL) 27-0.8 MG TABS tablet, Take 1 tablet by mouth daily at 12 noon., Disp: , Rfl: ;  zolpidem (AMBIEN) 5 MG tablet, Take 10 mg by mouth at bedtime as needed for sleep., Disp: , Rfl:  diphenhydrAMINE (SOMINEX) 25 MG tablet, Take 25 mg by mouth at bedtime as needed for sleep., Disp: , Rfl:   Allergies  Allergen Reactions  . Citalopram Nausea And Vomiting     Review of Systems Constitutional: -fever, -chills, -sweats, -unexpected weight change, -decreased appetite, -fatigue Allergy: -sneezing, -itching, -congestion Dermatology: -changing moles, --rash, +lumps ENT: -runny nose, -ear pain, -sore throat, -hoarseness, -sinus pain, -teeth pain, - ringing in ears, -hearing loss, -nosebleeds Cardiology: -chest pain, -palpitations, -swelling, -difficulty breathing when lying flat, -waking up short of breath Respiratory: -cough, -shortness of breath, -difficulty breathing with exercise or exertion, -wheezing, -coughing up blood Gastroenterology: -abdominal pain, -nausea, -vomiting, -diarrhea, -  constipation, -blood in stool, -changes in bowel movement, -difficulty swallowing or eating Hematology: -bleeding, -bruising  Musculoskeletal: -joint aches, -muscle aches, -joint swelling, -back pain, -neck pain, -cramping, -changes in gait Ophthalmology: denies vision changes, eye redness, itching, discharge Urology: -burning with urination, -difficulty urinating, -blood in urine,  -urinary frequency, -urgency, -incontinence Neurology: -headache, -weakness, -tingling, -numbness, -memory loss, -falls, -dizziness Psychology: -depressed mood, -agitation, -sleep problems     Objective:   Physical Exam  BP 100/80  Pulse 92  Temp(Src) 98.3 F (36.8 C) (Oral)  Resp 16  Ht 5\' 1"  (1.549 m)  Wt 127 lb (57.607 kg)  BMI 24.01 kg/m2  General appearance: alert, no distress, WD/WN, AA female  Skin: few scattered macules , scars from prior boils under breasts, no worrisome lesions HEENT: normocephalic, conjunctiva/corneas normal, sclerae anicteric, PERRLA, EOMi, nares patent, no discharge or erythema, pharynx normal Oral cavity: MMM, tongue normal, teeth in good repair Neck: supple, no lymphadenopathy, no thyromegaly, no masses, normal ROM, no bruits Chest: non tender, normal shape and expansion Heart: RRR, normal S1, S2, no murmurs Lungs: CTA bilaterally, no wheezes, rhonchi, or rales Abdomen: +bs, soft, several small surgical port scars from recent laparoscopy, non tender, non distended, no masses, no hepatomegaly, no splenomegaly, no bruits Back: non tender, normal ROM, no scoliosis Musculoskeletal: upper extremities non tender, no obvious deformity, normal ROM throughout, lower extremities non tender, no obvious deformity, normal ROM throughout Extremities: no edema, no cyanosis, no clubbing Pulses: 2+ symmetric, upper and lower extremities, normal cap refill Neurological: alert, oriented x 3, CN2-12 intact, strength normal upper extremities and lower extremities, sensation normal throughout, DTRs 2+ throughout, no cerebellar signs, gait normal Psychiatric: normal affect, behavior normal, pleasant  Breast/gyn/rectal - deferred   Assessment and Plan :    Encounter Diagnoses  Name Primary?  . Routine general medical examination at a health care facility Yes  . Need for Tdap vaccination   . Screening for breast cancer     Physical exam - discussed healthy  lifestyle, diet, exercise, preventative care, vaccinations, and addressed their concerns.  Handout given.  Advised she see eye doctor and dentist for routine care.  Labs today.  Flu shot advised yearly.  Counseled on the Tdap (tetanus, diptheria, and acellular pertussis) vaccine.  Vaccine information sheet given. Tdap vaccine given after consent obtained. Follow-up pending labs, mammogram.  Return for pap.

## 2014-04-16 NOTE — Patient Instructions (Signed)
  Thank you for giving me the opportunity to serve you today.    Your diagnosis today includes: Encounter Diagnoses  Name Primary?  . Routine general medical examination at a health care facility Yes  . Need for Tdap vaccination   . Screening for breast cancer      Specific recommendations today include:  We will call with lab results  We will refer you for mammogram  Return at your convenience for pap smear screening  See eye doctor and dentist yearly  We updated your Tdap vaccine today - tetanus, diptheria, pertussis  Exercise regularly, eat a healthy diet  Return pending labs.

## 2014-04-17 ENCOUNTER — Other Ambulatory Visit: Payer: Self-pay | Admitting: Family Medicine

## 2014-04-17 ENCOUNTER — Other Ambulatory Visit: Payer: Self-pay | Admitting: Medical

## 2014-04-17 DIAGNOSIS — Z1239 Encounter for other screening for malignant neoplasm of breast: Secondary | ICD-10-CM

## 2014-04-17 DIAGNOSIS — R7989 Other specified abnormal findings of blood chemistry: Secondary | ICD-10-CM

## 2014-04-17 DIAGNOSIS — D649 Anemia, unspecified: Secondary | ICD-10-CM

## 2014-04-17 LAB — LIPID PANEL
CHOL/HDL RATIO: 3 ratio
Cholesterol: 179 mg/dL (ref 0–200)
HDL: 60 mg/dL (ref >39)
LDL Cholesterol: 108 mg/dL — ABNORMAL HIGH (ref 0–99)
TRIGLYCERIDES: 53 mg/dL (ref <150)
VLDL: 11 mg/dL (ref 0–40)

## 2014-04-17 LAB — TSH: TSH: 0.095 u[IU]/mL — ABNORMAL LOW (ref 0.350–4.500)

## 2014-04-17 LAB — VITAMIN D 25 HYDROXY (VIT D DEFICIENCY, FRACTURES): Vit D, 25-Hydroxy: 23 ng/mL — ABNORMAL LOW (ref 30–89)

## 2014-04-17 MED ORDER — VITAMIN D (ERGOCALCIFEROL) 1.25 MG (50000 UNIT) PO CAPS: 50000.0000 [IU] | ORAL_CAPSULE | ORAL | Status: DC

## 2014-04-17 NOTE — Telephone Encounter (Signed)
I place her orders in EP[facility] and the breast center will contact her for her appointment. CLS

## 2014-04-21 ENCOUNTER — Encounter: Payer: Self-pay | Admitting: Family Medicine

## 2014-04-29 ENCOUNTER — Other Ambulatory Visit: Payer: Self-pay | Admitting: Medical

## 2014-05-01 ENCOUNTER — Ambulatory Visit: Payer: BC Managed Care – PPO

## 2014-05-06 ENCOUNTER — Telehealth: Payer: Self-pay | Admitting: Medical

## 2014-05-06 ENCOUNTER — Other Ambulatory Visit: Payer: Self-pay | Admitting: Medical

## 2014-05-06 MED ORDER — ZOLPIDEM TARTRATE 10 MG PO TABS: 10.0000 mg | ORAL_TABLET | Freq: Every evening | ORAL | Status: DC | PRN

## 2014-05-06 NOTE — Telephone Encounter (Signed)
Medication called in 

## 2014-05-06 NOTE — Telephone Encounter (Signed)
Call it out 

## 2014-07-07 ENCOUNTER — Telehealth: Payer: Self-pay | Admitting: Medical

## 2014-07-07 ENCOUNTER — Other Ambulatory Visit: Payer: Self-pay | Admitting: Family Medicine

## 2014-07-07 MED ORDER — ZOLPIDEM TARTRATE 10 MG PO TABS: 10.0000 mg | ORAL_TABLET | Freq: Every evening | ORAL | Status: DC | PRN

## 2014-07-07 NOTE — Telephone Encounter (Signed)
Call out [facility] with 2 refills, order electronically as well

## 2014-07-07 NOTE — Telephone Encounter (Signed)
I called out Ambien to her pharmacy per Chana Bode [facility] and I also placed the orders in the system. CLS

## 2014-07-20 ENCOUNTER — Other Ambulatory Visit: Payer: Self-pay | Admitting: Medical

## 2014-07-20 ENCOUNTER — Telehealth: Payer: Self-pay | Admitting: Family Medicine

## 2014-07-20 MED ORDER — LORAZEPAM 1 MG PO TABS: 1.0000 mg | ORAL_TABLET | Freq: Three times a day (TID) | ORAL | Status: DC

## 2014-07-20 NOTE — Telephone Encounter (Signed)
Pt walked in needing Ativan refill.  Her phone is not working.

## 2014-07-20 NOTE — Telephone Encounter (Signed)
rx ready 

## 2014-08-27 ENCOUNTER — Other Ambulatory Visit: Payer: BC Managed Care – PPO | Admitting: Medical

## 2014-08-31 ENCOUNTER — Other Ambulatory Visit: Payer: Self-pay | Admitting: Family Medicine

## 2014-08-31 ENCOUNTER — Telehealth: Payer: Self-pay | Admitting: Medical

## 2014-08-31 MED ORDER — LORAZEPAM 1 MG PO TABS: 1.0000 mg | ORAL_TABLET | Freq: Three times a day (TID) | ORAL | Status: DC

## 2014-08-31 NOTE — Telephone Encounter (Signed)
Call out 30 day of Lorazepam, there was pharmacy refill request that didn't go across properly

## 2014-08-31 NOTE — Telephone Encounter (Signed)
Called out Ativan to her pharmacy per Chana Bode [facility]

## 2014-09-09 ENCOUNTER — Encounter: Payer: Self-pay | Admitting: Medical

## 2014-09-21 ENCOUNTER — Telehealth: Payer: Self-pay | Admitting: Internal Medicine

## 2014-09-21 ENCOUNTER — Other Ambulatory Visit: Payer: Self-pay | Admitting: Medical

## 2014-09-21 MED ORDER — SERTRALINE HCL 50 MG PO TABS: 50.0000 mg | ORAL_TABLET | Freq: Every day | ORAL | Status: DC

## 2014-09-21 NOTE — Telephone Encounter (Signed)
Whatever Zoloft and Ambien she has she can go ahead and cut the tablets in half for each which would be equivalent to Zoloft 50mg  and Ambien 5mg .  I did send Zoloft/Sertraline 50mg  for when she runs out.  Have her call or follow up in 3-4 wk to see how this is working.

## 2014-09-21 NOTE — Telephone Encounter (Signed)
Pt called stating that she is on zoloft 100mg  but needs to be reduced to 50mg  as it makes her have a bad feeling and very nervous and then she is on ambien 10mg  and would like to be reduced to 5mg  as she is having a heavy feeling like she is hung over or something. Please send new rx to [facility] [address]

## 2014-09-22 ENCOUNTER — Other Ambulatory Visit: Payer: Self-pay | Admitting: Medical

## 2014-09-22 MED ORDER — SERTRALINE HCL 50 MG PO TABS: 50.0000 mg | ORAL_TABLET | Freq: Every day | ORAL | Status: DC

## 2014-09-22 NOTE — Telephone Encounter (Signed)
Patient states that she had tried Trazadone before and it wasn't bad. She said that she would like to try the medication Belsomra. She has seen the advertisement on TV. Please, advise

## 2014-09-22 NOTE — Telephone Encounter (Signed)
I was looking back through her records where we have discussed insomnia in the past.  Does she recall ever taking trazodone as a trial for insomnia?  If not I would like to try her on either trazodone or something newer called Belsomra for sleep.   Let me know.   If agreeable we can start a trial of samples of Belsomra.

## 2014-09-22 NOTE — Telephone Encounter (Signed)
Do you want to refill this ? 

## 2014-09-22 NOTE — Telephone Encounter (Signed)
Patient states that she cut the Ambien in half and it puts her to sleep but she doesn't stay a sleep. Please, advise what to do next.  Patient is aware of Dorothea Ogle PAc message in detail.

## 2014-09-23 ENCOUNTER — Other Ambulatory Visit: Payer: Self-pay | Admitting: Medical

## 2014-09-23 MED ORDER — TRAZODONE HCL 50 MG PO TABS
25.0000 mg | ORAL_TABLET | Freq: Every evening | ORAL | Status: DC | PRN
Start: 2014-09-23 — End: 2016-02-24

## 2014-09-23 NOTE — Telephone Encounter (Signed)
Please call her back and try and figure this out. Yesterday's message to me said that she wanted to try Belsomra, now today message says she doesn't.  Actually, Belsomra may be a good thing to try, less sedating less addictive than other medications, this can be taken every day.  I probably have samples too, so initially would be free.   Trazodone is also a good option, there are more potential side effects that are possible, but I don't have a lot of problems with this medication either

## 2014-09-23 NOTE — Telephone Encounter (Signed)
Pt states she does not want to try Belsomra. She wants Trazadone.

## 2014-09-23 NOTE — Telephone Encounter (Signed)
Spoke with patient, she does want to use Trazodone. Please send to the [facility] on [address].

## 2014-09-23 NOTE — Telephone Encounter (Signed)
Ok, lets try Trazodone, and ask her not to use other sleep aids at the same time to see how this works.  Can use 1/2 -1 tablet po QHS.  Start out the first week with 1/2 tablet QHS only to see how this works.

## 2014-09-23 NOTE — Telephone Encounter (Signed)
Patient aware.

## 2014-10-05 ENCOUNTER — Other Ambulatory Visit: Payer: BC Managed Care – PPO | Admitting: Medical

## 2014-10-26 ENCOUNTER — Encounter: Payer: Self-pay | Admitting: Family Medicine

## 2014-12-10 ENCOUNTER — Telehealth: Payer: Self-pay | Admitting: Family Medicine

## 2014-12-10 NOTE — Telephone Encounter (Signed)
Pt request refill for Trazadone and Ativan  At [facility] on [address]  [phone]

## 2014-12-10 NOTE — Telephone Encounter (Signed)
Check with Kimberly Bruce as there is a note that she may have been dismissed

## 2014-12-11 NOTE — Telephone Encounter (Signed)
Kimberly Bruce, let me know if this patient was sent a dismissal letter

## 2014-12-14 NOTE — Telephone Encounter (Signed)
Patient was sent a dismissal letter 10/26/14 and we would cover for 30 days until 11/26/14 while pt finds a new doctor.

## 2014-12-15 NOTE — Telephone Encounter (Signed)
I left a message on her voicemail that we would not be able to refill any of her medication's due to a dismissal letter that was sent out to her on 10/26/14

## 2015-01-19 ENCOUNTER — Emergency Department (HOSPITAL_COMMUNITY)
Admission: EM | Admit: 2015-01-19 | Discharge: 2015-01-19 | Disposition: A | Discharge status: Home / Self Care | Payer: Managed Care, Other (non HMO) | Attending: Emergency Medicine | Admitting: Emergency Medicine

## 2015-01-19 ENCOUNTER — Emergency Department (HOSPITAL_COMMUNITY): Payer: Managed Care, Other (non HMO)

## 2015-01-19 ENCOUNTER — Encounter (HOSPITAL_COMMUNITY): Payer: Self-pay | Admitting: *Deleted

## 2015-01-19 DIAGNOSIS — F419 Anxiety disorder, unspecified: Secondary | ICD-10-CM | POA: Diagnosis not present

## 2015-01-19 DIAGNOSIS — G4700 Insomnia, unspecified: Secondary | ICD-10-CM | POA: Insufficient documentation

## 2015-01-19 DIAGNOSIS — Z3202 Encounter for pregnancy test, result negative: Secondary | ICD-10-CM | POA: Insufficient documentation

## 2015-01-19 DIAGNOSIS — Z862 Personal history of diseases of the blood and blood-forming organs and certain disorders involving the immune mechanism: Secondary | ICD-10-CM | POA: Insufficient documentation

## 2015-01-19 DIAGNOSIS — Z79899 Other long term (current) drug therapy: Secondary | ICD-10-CM | POA: Diagnosis not present

## 2015-01-19 DIAGNOSIS — R1030 Lower abdominal pain, unspecified: Secondary | ICD-10-CM | POA: Diagnosis not present

## 2015-01-19 DIAGNOSIS — N898 Other specified noninflammatory disorders of vagina: Secondary | ICD-10-CM | POA: Insufficient documentation

## 2015-01-19 DIAGNOSIS — M545 Low back pain, unspecified: Secondary | ICD-10-CM

## 2015-01-19 DIAGNOSIS — R109 Unspecified abdominal pain: Secondary | ICD-10-CM

## 2015-01-19 LAB — COMPREHENSIVE METABOLIC PANEL
ALT: 6 U/L (ref 0–35)
AST: 21 U/L (ref 0–37)
Albumin: 4.4 g/dL (ref 3.5–5.2)
Alkaline Phosphatase: 70 U/L (ref 39–117)
Anion gap: 10 (ref 5–15)
BUN: 17 mg/dL (ref 6–23)
CHLORIDE: 103 mmol/L (ref 96–112)
CO2: 27 mmol/L (ref 19–32)
CREATININE: 0.81 mg/dL (ref 0.50–1.10)
Calcium: 9.5 mg/dL (ref 8.4–10.5)
GFR calc non Af Amer: 87 mL/min — ABNORMAL LOW (ref >90)
Glucose, Bld: 93 mg/dL (ref 70–99)
POTASSIUM: 3.9 mmol/L (ref 3.5–5.1)
SODIUM: 140 mmol/L (ref 135–145)
Total Bilirubin: 0.6 mg/dL (ref 0.3–1.2)
Total Protein: 8.4 g/dL — ABNORMAL HIGH (ref 6.0–8.3)

## 2015-01-19 LAB — URINALYSIS, ROUTINE W REFLEX MICROSCOPIC
GLUCOSE, UA: NEGATIVE mg/dL
KETONES UR: NEGATIVE mg/dL
LEUKOCYTES UA: NEGATIVE
NITRITE: NEGATIVE
PROTEIN: NEGATIVE mg/dL
SPECIFIC GRAVITY, URINE: 1.027 (ref 1.005–1.030)
Urobilinogen, UA: 1 mg/dL (ref 0.0–1.0)
pH: 6 (ref 5.0–8.0)

## 2015-01-19 LAB — CBC WITH DIFFERENTIAL/PLATELET
BASOS ABS: 0 10*3/uL (ref 0.0–0.1)
BASOS PCT: 1 % (ref 0–1)
Eosinophils Absolute: 0.2 10*3/uL (ref 0.0–0.7)
Eosinophils Relative: 3 % (ref 0–5)
HCT: 38.1 % (ref 36.0–46.0)
Hemoglobin: 12.5 g/dL (ref 12.0–15.0)
LYMPHS PCT: 40 % (ref 12–46)
Lymphs Abs: 2.3 10*3/uL (ref 0.7–4.0)
MCH: 30.9 pg (ref 26.0–34.0)
MCHC: 32.8 g/dL (ref 30.0–36.0)
MCV: 94.3 fL (ref 78.0–100.0)
MONOS PCT: 6 % (ref 3–12)
Monocytes Absolute: 0.3 10*3/uL (ref 0.1–1.0)
Neutro Abs: 2.9 10*3/uL (ref 1.7–7.7)
Neutrophils Relative %: 50 % (ref 43–77)
PLATELETS: 371 10*3/uL (ref 150–400)
RBC: 4.04 MIL/uL (ref 3.87–5.11)
RDW: 12.6 % (ref 11.5–15.5)
WBC: 5.7 10*3/uL (ref 4.0–10.5)

## 2015-01-19 LAB — POC URINE PREG, ED: PREG TEST UR: NEGATIVE

## 2015-01-19 LAB — WET PREP, GENITAL
Clue Cells Wet Prep HPF POC: NONE SEEN
Trich, Wet Prep: NONE SEEN
YEAST WET PREP: NONE SEEN

## 2015-01-19 LAB — URINE MICROSCOPIC-ADD ON

## 2015-01-19 LAB — LIPASE, BLOOD: Lipase: 28 U/L (ref 11–59)

## 2015-01-19 MED ORDER — OXYCODONE-ACETAMINOPHEN 5-325 MG PO TABS
1.0000 | ORAL_TABLET | Freq: Once | ORAL | Status: AC
  Administered 2015-01-19: 1 via ORAL
  Filled 2015-01-19: qty 1

## 2015-01-19 MED ORDER — OXYCODONE-ACETAMINOPHEN 5-325 MG PO TABS: 1.0000 | ORAL_TABLET | Freq: Four times a day (QID) | ORAL | Status: DC | PRN

## 2015-01-19 MED ORDER — CEFTRIAXONE SODIUM 250 MG IJ SOLR
250.0000 mg | Freq: Once | INTRAMUSCULAR | Status: AC
Start: 2015-01-19 — End: 2015-01-19
  Administered 2015-01-19: 250 mg via INTRAMUSCULAR
  Filled 2015-01-19: qty 250

## 2015-01-19 MED ORDER — DOXYCYCLINE HYCLATE 100 MG PO TABS
100.0000 mg | ORAL_TABLET | Freq: Once | ORAL | Status: AC
  Administered 2015-01-19: 100 mg via ORAL
  Filled 2015-01-19: qty 1

## 2015-01-19 MED ORDER — METRONIDAZOLE 500 MG PO TABS
500.0000 mg | ORAL_TABLET | Freq: Once | ORAL | Status: AC
  Administered 2015-01-19: 500 mg via ORAL
  Filled 2015-01-19: qty 1

## 2015-01-19 MED ORDER — DOXYCYCLINE HYCLATE 100 MG PO CAPS: 100.0000 mg | ORAL_CAPSULE | Freq: Two times a day (BID) | ORAL | Status: DC

## 2015-01-19 MED ORDER — METRONIDAZOLE 500 MG PO TABS: 500.0000 mg | ORAL_TABLET | Freq: Two times a day (BID) | ORAL | Status: DC

## 2015-01-19 MED ORDER — MORPHINE SULFATE 4 MG/ML IJ SOLN
4.0000 mg | INTRAMUSCULAR | Status: DC | PRN
  Filled 2015-01-19: qty 1

## 2015-01-19 MED ORDER — OXYCODONE-ACETAMINOPHEN 5-325 MG PO TABS: ORAL_TABLET | ORAL | Status: DC

## 2015-01-19 MED ORDER — ONDANSETRON HCL 4 MG/2ML IJ SOLN
4.0000 mg | Freq: Once | INTRAMUSCULAR | Status: DC
Start: 2015-01-19 — End: 2015-01-19
  Filled 2015-01-19: qty 2

## 2015-01-19 MED ORDER — LIDOCAINE HCL (PF) 1 % IJ SOLN
INTRAMUSCULAR | Status: AC
  Administered 2015-01-19: 5 mL
  Filled 2015-01-19: qty 5

## 2015-01-19 NOTE — ED Notes (Signed)
US at bedside

## 2015-01-19 NOTE — ED Provider Notes (Signed)
CSN: [id number]     Arrival date & time 01/19/15  1042 History   First MD Initiated Contact with Patient 01/19/15 1150     Chief Complaint  Patient presents with  . Back Pain  . Abdominal Pain     (Consider location/radiation/quality/duration/timing/severity/associated sxs/prior Treatment) HPI   Nyree Applegate is a 44 y.o. female complaining of severe low back pain onset 2 days ago radiates around to the bilateral hips and lower abdomen which she rates at 8 out of 10, no exacerbating or alleviating factors identified. She hasn't taken any pain medication at home. 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 She denies fever, chills, nausea, vomiting, abnormal vaginal discharge but she says that she has a foul smell in the vaginal area, she states that she's had this for a month. She states that this is the same constellation of symptoms that she had one year ago. Denies IV drug use, history of cancer, saddle anesthesia, weakness, numbness, difficulty ambulating.  Bufford Lope  Past Medical History  Diagnosis Date  . Anxiety 1996  . Insomnia   . Ovarian cyst   . Anemia     since teenage years   Past Surgical History  Procedure Laterality Date  . Cesarean section      x2   . Wisdom tooth extraction    . Laparoscopy N/A 01/30/2014    Procedure: LAPAROSCOPY DIAGNOSTIC;  Surgeon: Lovenia Kim, MD;  Location: [facility];  Service: Gynecology;  Laterality: N/A;  . Robotic assisted laparoscopic lysis of adhesion N/A 01/30/2014    Procedure: Possible ROBOTIC ASSISTED LAPAROSCOPIC LYSIS OF ADHESION WITH LEFT SALPINGO OOPHORECTOMY;  Surgeon: Lovenia Kim, MD;  Location: [facility];  Service: Gynecology;  Laterality: N/A;   Family History  Problem Relation Age of Onset  . Aneurysm Father     brain  . Hepatitis C Father   . Cancer Maternal Aunt     breast  .  Cancer Maternal Uncle     prostate?  Marland Kitchen Alcohol abuse Maternal Grandmother   . Heart disease Paternal Grandmother   . Other Paternal Grandfather     unknown   History  Substance Use Topics  . Smoking status: Never Smoker   . Smokeless tobacco: Not on file  . Alcohol Use: No     Comment: occasionally   OB History    No data available     Review of Systems  10 systems reviewed and found to be negative, except as noted in the HPI.   Allergies  Citalopram  Home Medications   Prior to Admission medications   Medication Sig Start Date End Date Taking? Authorizing Provider  ALPRAZolam Duanne Moron) 1 MG tablet Take 1 tablet by mouth 2 (two) times daily as needed for anxiety.  01/16/15  Yes Historical Provider, MD  diphenhydrAMINE (SOMINEX) 25 MG tablet Take 25 mg by mouth at bedtime as needed for sleep.   Yes Historical Provider, MD  doxycycline (VIBRAMYCIN) 100 MG capsule Take 1 capsule (100 mg total) by mouth 2 (two) times daily. 01/19/15   Laconya Clere, PA-C  LORazepam (ATIVAN) 1 MG tablet Take 1 tablet (1 mg total) by mouth every 8 (eight) hours. 08/31/14   Camelia Eng Tysinger, PA-C  metroNIDAZOLE (FLAGYL) 500 MG tablet Take 1 tablet (500 mg total) by mouth 2 (two) times daily. 01/19/15   Monico Blitz, PA-C  oxyCODONE-acetaminophen (PERCOCET/ROXICET) 5-325 MG per tablet 1 to 2 tabs PO q6hrs  PRN for pain 01/19/15   Elmyra Ricks Amilcar Reever, PA-C  sertraline (ZOLOFT) 50 MG tablet Take 1 tablet (50 mg total) by mouth daily. 09/22/14   Camelia Eng Tysinger, PA-C  traZODone (DESYREL) 50 MG tablet Take 0.5-1 tablets (25-50 mg total) by mouth at bedtime as needed for sleep. 09/23/14   Camelia Eng Tysinger, PA-C  Vitamin D, Ergocalciferol, (DRISDOL) 50000 UNITS CAPS capsule Take 1 capsule (50,000 Units total) by mouth every 7 (seven) days. 04/17/14   Camelia Eng Tysinger, PA-C  zolpidem (AMBIEN) 10 MG tablet Take 1 tablet (10 mg total) by mouth at bedtime as needed for sleep. 07/07/14   Camelia Eng Tysinger, PA-C   BP  99/60 mmHg  Pulse 79  Temp(Src) 97.9 F (36.6 C) (Oral)  Resp 16  SpO2 100% Physical Exam  Constitutional: She is oriented to person, place, and time. She appears well-developed and well-nourished. No distress.  HENT:  Head: Normocephalic and atraumatic.  Mouth/Throat: Oropharynx is clear and moist.  Eyes: Conjunctivae and EOM are normal. Pupils are equal, round, and reactive to light.  Cardiovascular: Normal rate, regular rhythm and intact distal pulses.   Pulmonary/Chest: Effort normal and breath sounds normal. No stridor. No respiratory distress. She has no wheezes. She has no rales.  Abdominal: Soft. Bowel sounds are normal. She exhibits no distension and no mass. There is no tenderness. There is no rebound and no guarding.  Genitourinary:  Pelvic exam a chaperoned by nurse: No rashes or lesions, there is a foul-smelling white discharge. No cervical motion or adnexal tenderness.  Musculoskeletal: Normal range of motion.  No point tenderness to percussion of lumbar spinal processes.  No TTP or paraspinal muscular spasm. Strength is 5 out of 5 to bilateral lower extremities at hip and knee; neurovascularly intact. No saddle anaesthesia. Patellar reflexes are 2+ bilaterally.       Neurological: She is alert and oriented to person, place, and time.  Psychiatric: She has a normal mood and affect.  Nursing note and vitals reviewed.   ED Course  Procedures (including critical care time) Labs Review Labs Reviewed  WET PREP, GENITAL - Abnormal; Notable for the following:    WBC, Wet Prep HPF POC FEW (*)    All other components within normal limits  COMPREHENSIVE METABOLIC PANEL - Abnormal; Notable for the following:    Total Protein 8.4 (*)    GFR calc non Af Amer 87 (*)    All other components within normal limits  URINALYSIS, ROUTINE W REFLEX MICROSCOPIC - Abnormal; Notable for the following:    Color, Urine AMBER (*)    APPearance CLOUDY (*)    Hgb urine dipstick SMALL (*)     Bilirubin Urine SMALL (*)    All other components within normal limits  URINE MICROSCOPIC-ADD ON - Abnormal; Notable for the following:    Squamous Epithelial / LPF FEW (*)    Bacteria, UA FEW (*)    Crystals CA OXALATE CRYSTALS (*)    All other components within normal limits  LIPASE, BLOOD  CBC WITH DIFFERENTIAL/PLATELET  RPR  POC URINE PREG, ED  GC/CHLAMYDIA PROBE AMP ([place])    Imaging Review US Transvaginal Non-ob  01/19/2015   CLINICAL DATA:  Back pain, abdominal pain  EXAM: TRANSABDOMINAL AND TRANSVAGINAL ULTRASOUND OF PELVIS  TECHNIQUE: Both transabdominal and transvaginal ultrasound examinations of the pelvis were performed. Transabdominal technique was performed for global imaging of the pelvis including  uterus, ovaries, adnexal regions, and pelvic cul-de-sac. It was necessary to proceed with endovaginal exam following the transabdominal exam to visualize the endometrium and ovaries.  COMPARISON:  10/09/2013  FINDINGS: Uterus  Measurements: 8.1 x 4.7 x 6 cm. No fibroids or other mass visualized.  Endometrium  Thickness: 1.1 cm.  No focal abnormality visualized.  Right ovary  Measurements: 3.3 x 1.4 x 2.3 cm. 1.5 x 1.0 x 1.4 cm homogeneously hypoechoic avascular right ovarian mass. Tubular structure in the right adnexal region measuring 2.8 cm in length and 1.1 cm in diameter likely reflecting hydrosalpinx.  Left ovary  Prior oophorectomy.  Other findings  No free fluid.  IMPRESSION: 1. There is a 1.5 x 1 x 1.4 cm homogeneously hypoechoic avascular right ovarian mass. Differential diagnosis includes an endometrioma versus hemorrhagic cyst. Follow-up pelvic ultrasound in 6-8 weeks can be performed. 2. Tubular cystic structure in the right adnexa likely representing hydrosalpinx.   Electronically Signed   By: Kathreen Devoid   On: 01/19/2015 14:15   US Pelvis Complete  01/19/2015   CLINICAL DATA:  Back pain, abdominal pain  EXAM: TRANSABDOMINAL AND TRANSVAGINAL ULTRASOUND OF PELVIS   TECHNIQUE: Both transabdominal and transvaginal ultrasound examinations of the pelvis were performed. Transabdominal technique was performed for global imaging of the pelvis including uterus, ovaries, adnexal regions, and pelvic cul-de-sac. It was necessary to proceed with endovaginal exam following the transabdominal exam to visualize the endometrium and ovaries.  COMPARISON:  10/09/2013  FINDINGS: Uterus  Measurements: 8.1 x 4.7 x 6 cm. No fibroids or other mass visualized.  Endometrium  Thickness: 1.1 cm.  No focal abnormality visualized.  Right ovary  Measurements: 3.3 x 1.4 x 2.3 cm. 1.5 x 1.0 x 1.4 cm homogeneously hypoechoic avascular right ovarian mass. Tubular structure in the right adnexal region measuring 2.8 cm in length and 1.1 cm in diameter likely reflecting hydrosalpinx.  Left ovary  Prior oophorectomy.  Other findings  No free fluid.  IMPRESSION: 1. There is a 1.5 x 1 x 1.4 cm homogeneously hypoechoic avascular right ovarian mass. Differential diagnosis includes an endometrioma versus hemorrhagic cyst. Follow-up pelvic ultrasound in 6-8 weeks can be performed. 2. Tubular cystic structure in the right adnexa likely representing hydrosalpinx.   Electronically Signed   By: Kathreen Devoid   On: 01/19/2015 14:15     EKG Interpretation None      MDM   Final diagnoses:  Lower abdominal pain  Bilateral low back pain without sciatica    Filed Vitals:   01/19/15 1049 01/19/15 1417  BP: 101/58 99/60  Pulse: 93 79  Temp: 97.8 F (36.6 C) 97.9 F (36.6 C)  TempSrc: Oral Oral  Resp: 16 16  SpO2: 100% 100%    Medications  morphine 4 MG/ML injection 4 mg (not administered)  ondansetron (ZOFRAN) injection 4 mg (4 mg Intravenous Not Given 01/19/15 1308)  oxyCODONE-acetaminophen (PERCOCET/ROXICET) 5-325 MG per tablet 1 tablet (1 tablet Oral Given 01/19/15 1305)  cefTRIAXone (ROCEPHIN) injection 250 mg (250 mg Intramuscular Given 01/19/15 1523)  doxycycline (VIBRA-TABS) tablet 100 mg (100 mg  Oral Given 01/19/15 1524)  metroNIDAZOLE (FLAGYL) tablet 500 mg (500 mg Oral Given 01/19/15 1524)  lidocaine (PF) (XYLOCAINE) 1 % injection (5 mLs  Given 01/19/15 1525)    Irena Gaydos is a pleasant 44 y.o. female presenting with a very low back pain radiating around to the abdomen bilaterally associated with ethanol smell coming from the vaginal area. States this is similar to case of severe PID which  required bilateral oophorectomy last year. Abdominal exam is nonsurgical. Will get basic blood work and pelvic in addition to pelvic ultrasound.  Urinalysis shows no acute abnormality, blood work within normal limits, pelvic does have a foul discharge however the wet prep is essentially normal, only a few white blood cells, I doubt PID.  Ultrasound shows a 1/2 cm hypoechoic avascular right ovarian mass and the recommend a follow-up pelvic ultrasound in 6-8 weeks, they also see a tubular structure in the right adnexa likely representing hydrosalpinx.  Discussed case with attending physician who recommends treatment for PID and close follow-up with OB/GYN.  Evaluation does not show pathology that would require ongoing emergent intervention or inpatient treatment. Pt is hemodynamically stable and mentating appropriately. Discussed findings and plan with patient/guardian, who agrees with care plan. All questions answered. Return precautions discussed and outpatient follow up given.   Discharge Medication List as of 01/19/2015  3:04 PM    START taking these medications   Details  doxycycline (VIBRAMYCIN) 100 MG capsule Take 1 capsule (100 mg total) by mouth 2 (two) times daily., Starting 01/19/2015, Until Discontinued, Print    metroNIDAZOLE (FLAGYL) 500 MG tablet Take 1 tablet (500 mg total) by mouth 2 (two) times daily., Starting 01/19/2015, Until Discontinued, Print    oxyCODONE-acetaminophen (PERCOCET/ROXICET) 5-325 MG per tablet 1 to 2 tabs PO q6hrs  PRN for pain, Print             Monico Blitz, PA-C 01/19/15 1545  Ripley Fraise, MD 01/20/15 [phone]

## 2015-01-19 NOTE — Discharge Instructions (Signed)
Take your antibiotics as directed and to completion. You should never have any leftover antibiotics! Push fluids and stay well hydrated.   Any antibiotic use can reduce the efficacy of hormonal birth control. Please use back up method of contraception.   Do not drink alcohol while you are taking flagyl (metronidazole) because it will make you very sick.  Please follow with your primary care doctor in the next 2 days for a check-up. They must obtain records for further management.   Do not hesitate to return to the Emergency Department for any new, worsening or concerning symptoms.    Abdominal Pain, Women Abdominal (stomach, pelvic, or belly) pain can be caused by many things. It is important to tell your doctor:  The location of the pain.  Does it come and go or is it present all the time?  Are there things that start the pain (eating certain foods, exercise)?  Are there other symptoms associated with the pain (fever, nausea, vomiting, diarrhea)? All of this is helpful to know when trying to find the cause of the pain. CAUSES   Stomach: virus or bacteria infection, or ulcer.  Intestine: appendicitis (inflamed appendix), regional ileitis (Crohn's disease), ulcerative colitis (inflamed colon), irritable bowel syndrome, diverticulitis (inflamed diverticulum of the colon), or cancer of the stomach or intestine.  Gallbladder disease or stones in the gallbladder.  Kidney disease, kidney stones, or infection.  Pancreas infection or cancer.  Fibromyalgia (pain disorder).  Diseases of the female organs:  Uterus: fibroid (non-cancerous) tumors or infection.  Fallopian tubes: infection or tubal pregnancy.  Ovary: cysts or tumors.  Pelvic adhesions (scar tissue).  Endometriosis (uterus lining tissue growing in the pelvis and on the pelvic organs).  Pelvic congestion syndrome (female organs filling up with blood just before the menstrual period).  Pain with the menstrual  period.  Pain with ovulation (producing an egg).  Pain with an IUD (intrauterine device, birth control) in the uterus.  Cancer of the female organs.  Functional pain (pain not caused by a disease, may improve without treatment).  Psychological pain.  Depression. DIAGNOSIS  Your doctor will decide the seriousness of your pain by doing an examination.  Blood tests.  X-rays.  Ultrasound.  CT scan (computed tomography, special type of X-ray).  MRI (magnetic resonance imaging).  Cultures, for infection.  Barium enema (dye inserted in the large intestine, to better view it with X-rays).  Colonoscopy (looking in intestine with a lighted tube).  Laparoscopy (minor surgery, looking in abdomen with a lighted tube).  Major abdominal exploratory surgery (looking in abdomen with a large incision). TREATMENT  The treatment will depend on the cause of the pain.   Many cases can be observed and treated at home.  Over-the-counter medicines recommended by your caregiver.  Prescription medicine.  Antibiotics, for infection.  Birth control pills, for painful periods or for ovulation pain.  Hormone treatment, for endometriosis.  Nerve blocking injections.  Physical therapy.  Antidepressants.  Counseling with a psychologist or psychiatrist.  Minor or major surgery. HOME CARE INSTRUCTIONS   Do not take laxatives, unless directed by your caregiver.  Take over-the-counter pain medicine only if ordered by your caregiver. Do not take aspirin because it can cause an upset stomach or bleeding.  Try a clear liquid diet (broth or water) as ordered by your caregiver. Slowly move to a bland diet, as tolerated, if the pain is related to the stomach or intestine.  Have a thermometer and take your temperature several times a  day, and record it.  Bed rest and sleep, if it helps the pain.  Avoid sexual intercourse, if it causes pain.  Avoid stressful situations.  Keep your  follow-up appointments and tests, as your caregiver orders.  If the pain does not go away with medicine or surgery, you may try:  Acupuncture.  Relaxation exercises (yoga, meditation).  Group therapy.  Counseling. SEEK MEDICAL CARE IF:   You notice certain foods cause stomach pain.  Your home care treatment is not helping your pain.  You need stronger pain medicine.  You want your IUD removed.  You feel faint or lightheaded.  You develop nausea and vomiting.  You develop a rash.  You are having side effects or an allergy to your medicine. SEEK IMMEDIATE MEDICAL CARE IF:   Your pain does not go away or gets worse.  You have a fever.  Your pain is felt only in portions of the abdomen. The right side could possibly be appendicitis. The left lower portion of the abdomen could be colitis or diverticulitis.  You are passing blood in your stools (bright red or black tarry stools, with or without vomiting).  You have blood in your urine.  You develop chills, with or without a fever.  You pass out. MAKE SURE YOU:   Understand these instructions.  Will watch your condition.  Will get help right away if you are not doing well or get worse. Document Released: 08/06/2007 Document Revised: 02/23/2014 Document Reviewed: 08/26/2009 [facility] Patient Information 2015 [place], [place]. This information is not intended to replace advice given to you by your health care provider. Make sure you discuss any questions you have with your health care provider.

## 2015-01-19 NOTE — ED Notes (Signed)
Pt reports lower back pain, more on left side since yesterday, denies trauma or injury. Now pt having lower abdominal/pelvic pain. Pt had partial hysterectomy last year, this month pt has not had period. Pain 9/10. Denies dysuria. Denies n/v/d.

## 2015-01-19 NOTE — ED Notes (Signed)
I asked patient to urinate, she said it hurts to pee. She will call when she can go

## 2015-01-19 NOTE — ED Notes (Signed)
Pt. Is unable to use the restroom at this time, but is aware that we need a urine specimen.  

## 2015-01-20 LAB — GC/CHLAMYDIA PROBE AMP (~~LOC~~) NOT AT ARMC
CHLAMYDIA, DNA PROBE: NEGATIVE
Neisseria Gonorrhea: NEGATIVE

## 2015-01-20 LAB — RPR: RPR Ser Ql: NONREACTIVE

## 2015-04-22 ENCOUNTER — Emergency Department (HOSPITAL_COMMUNITY)
Admission: EM | Admit: 2015-04-22 | Discharge: 2015-04-22 | Disposition: A | Discharge status: Home / Self Care | Payer: Managed Care, Other (non HMO) | Attending: Emergency Medicine | Admitting: Emergency Medicine

## 2015-04-22 ENCOUNTER — Encounter (HOSPITAL_COMMUNITY): Payer: Self-pay | Admitting: Emergency Medicine

## 2015-04-22 DIAGNOSIS — R1032 Left lower quadrant pain: Secondary | ICD-10-CM | POA: Diagnosis not present

## 2015-04-22 DIAGNOSIS — R102 Pelvic and perineal pain: Secondary | ICD-10-CM

## 2015-04-22 DIAGNOSIS — Z862 Personal history of diseases of the blood and blood-forming organs and certain disorders involving the immune mechanism: Secondary | ICD-10-CM | POA: Insufficient documentation

## 2015-04-22 DIAGNOSIS — Z3202 Encounter for pregnancy test, result negative: Secondary | ICD-10-CM | POA: Diagnosis not present

## 2015-04-22 DIAGNOSIS — Z9889 Other specified postprocedural states: Secondary | ICD-10-CM | POA: Diagnosis not present

## 2015-04-22 DIAGNOSIS — F419 Anxiety disorder, unspecified: Secondary | ICD-10-CM | POA: Insufficient documentation

## 2015-04-22 DIAGNOSIS — R52 Pain, unspecified: Secondary | ICD-10-CM

## 2015-04-22 DIAGNOSIS — G4700 Insomnia, unspecified: Secondary | ICD-10-CM | POA: Insufficient documentation

## 2015-04-22 DIAGNOSIS — Z792 Long term (current) use of antibiotics: Secondary | ICD-10-CM | POA: Insufficient documentation

## 2015-04-22 DIAGNOSIS — R1031 Right lower quadrant pain: Secondary | ICD-10-CM | POA: Diagnosis not present

## 2015-04-22 DIAGNOSIS — Z8742 Personal history of other diseases of the female genital tract: Secondary | ICD-10-CM | POA: Diagnosis not present

## 2015-04-22 DIAGNOSIS — Z79899 Other long term (current) drug therapy: Secondary | ICD-10-CM | POA: Insufficient documentation

## 2015-04-22 LAB — COMPREHENSIVE METABOLIC PANEL
ALT: 6 U/L — ABNORMAL LOW (ref 14–54)
ANION GAP: 11 (ref 5–15)
AST: 21 U/L (ref 15–41)
Albumin: 4.6 g/dL (ref 3.5–5.0)
Alkaline Phosphatase: 68 U/L (ref 38–126)
BUN: 10 mg/dL (ref 6–20)
CHLORIDE: 104 mmol/L (ref 101–111)
CO2: 27 mmol/L (ref 22–32)
CREATININE: 0.65 mg/dL (ref 0.44–1.00)
Calcium: 9.5 mg/dL (ref 8.9–10.3)
GFR calc non Af Amer: >60 mL/min (ref >60)
Glucose, Bld: 78 mg/dL (ref 65–99)
Potassium: 3.2 mmol/L — ABNORMAL LOW (ref 3.5–5.1)
SODIUM: 142 mmol/L (ref 135–145)
Total Bilirubin: 0.6 mg/dL (ref 0.3–1.2)
Total Protein: 8.5 g/dL — ABNORMAL HIGH (ref 6.5–8.1)

## 2015-04-22 LAB — URINALYSIS, ROUTINE W REFLEX MICROSCOPIC
BILIRUBIN URINE: NEGATIVE
Glucose, UA: NEGATIVE mg/dL
Ketones, ur: 15 mg/dL — AB
Nitrite: NEGATIVE
PROTEIN: NEGATIVE mg/dL
Specific Gravity, Urine: 1.022 (ref 1.005–1.030)
Urobilinogen, UA: 1 mg/dL (ref 0.0–1.0)
pH: 6.5 (ref 5.0–8.0)

## 2015-04-22 LAB — CBC WITH DIFFERENTIAL/PLATELET
BASOS ABS: 0 10*3/uL (ref 0.0–0.1)
Basophils Relative: 1 % (ref 0–1)
EOS PCT: 2 % (ref 0–5)
Eosinophils Absolute: 0.1 10*3/uL (ref 0.0–0.7)
HCT: 36.4 % (ref 36.0–46.0)
HEMOGLOBIN: 11.9 g/dL — AB (ref 12.0–15.0)
LYMPHS PCT: 45 % (ref 12–46)
Lymphs Abs: 2.7 10*3/uL (ref 0.7–4.0)
MCH: 30.7 pg (ref 26.0–34.0)
MCHC: 32.7 g/dL (ref 30.0–36.0)
MCV: 94.1 fL (ref 78.0–100.0)
Monocytes Absolute: 0.4 10*3/uL (ref 0.1–1.0)
Monocytes Relative: 7 % (ref 3–12)
Neutro Abs: 2.8 10*3/uL (ref 1.7–7.7)
Neutrophils Relative %: 45 % (ref 43–77)
Platelets: 464 10*3/uL — ABNORMAL HIGH (ref 150–400)
RBC: 3.87 MIL/uL (ref 3.87–5.11)
RDW: 12.6 % (ref 11.5–15.5)
WBC: 6.1 10*3/uL (ref 4.0–10.5)

## 2015-04-22 LAB — URINE MICROSCOPIC-ADD ON

## 2015-04-22 LAB — WET PREP, GENITAL
Clue Cells Wet Prep HPF POC: NONE SEEN
TRICH WET PREP: NONE SEEN
Yeast Wet Prep HPF POC: NONE SEEN

## 2015-04-22 LAB — POC URINE PREG, ED: PREG TEST UR: NEGATIVE

## 2015-04-22 LAB — LIPASE, BLOOD: LIPASE: 37 U/L (ref 22–51)

## 2015-04-22 MED ORDER — MORPHINE SULFATE 4 MG/ML IJ SOLN
4.0000 mg | Freq: Once | INTRAMUSCULAR | Status: AC
  Administered 2015-04-22: 4 mg via INTRAVENOUS
  Filled 2015-04-22: qty 1

## 2015-04-22 MED ORDER — NAPROXEN 500 MG PO TABS: 500.0000 mg | ORAL_TABLET | Freq: Two times a day (BID) | ORAL | Status: DC

## 2015-04-22 MED ORDER — SODIUM CHLORIDE 0.9 % IV SOLN
1000.0000 mL | Freq: Once | INTRAVENOUS | Status: AC
  Administered 2015-04-22: 1000 mL via INTRAVENOUS

## 2015-04-22 MED ORDER — SODIUM CHLORIDE 0.9 % IV SOLN: 1000.0000 mL | INTRAVENOUS | Status: DC

## 2015-04-22 MED ORDER — ONDANSETRON HCL 4 MG/2ML IJ SOLN
4.0000 mg | Freq: Once | INTRAMUSCULAR | Status: AC
  Administered 2015-04-22: 4 mg via INTRAVENOUS
  Filled 2015-04-22: qty 2

## 2015-04-22 NOTE — ED Provider Notes (Signed)
CSN: [id number]     Arrival date & time 04/22/15  1042 History   First MD Initiated Contact with Patient 04/22/15 1052     Chief Complaint  Patient presents with  . Pelvic Pain    HPI Patient has been having trouble with intermittent lower abdominal pain worse on the left side for several months. Patient was seen previously for this a few months ago. She was diagnosed with a possible ovarian cyst versus lesion. She was instructed to have a follow-up ultrasound performed. The patient was given a referral to an OB/GYN doctor. Patient was unable to make an appointment because of some financial issues owing Money. Patient has not followed up with anyone.  She has noted some irregular menstrual periods. She denies any trouble with fevers or chills. No vomiting or diarrhea. She has not been eating well. Past Medical History  Diagnosis Date  . Anxiety 1996  . Insomnia   . Ovarian cyst   . Anemia     since teenage years   Past Surgical History  Procedure Laterality Date  . Cesarean section      x2   . Wisdom tooth extraction    . Laparoscopy N/A 01/30/2014    Procedure: LAPAROSCOPY DIAGNOSTIC;  Surgeon: Lovenia Kim, MD;  Location: [facility];  Service: Gynecology;  Laterality: N/A;  . Robotic assisted laparoscopic lysis of adhesion N/A 01/30/2014    Procedure: Possible ROBOTIC ASSISTED LAPAROSCOPIC LYSIS OF ADHESION WITH LEFT SALPINGO OOPHORECTOMY;  Surgeon: Lovenia Kim, MD;  Location: [facility];  Service: Gynecology;  Laterality: N/A;   Family History  Problem Relation Age of Onset  . Aneurysm Father     brain  . Hepatitis C Father   . Cancer Maternal Aunt     breast  . Cancer Maternal Uncle     prostate?  Marland Kitchen Alcohol abuse Maternal Grandmother   . Heart disease Paternal Grandmother   . Other Paternal Grandfather     unknown   History  Substance Use Topics  . Smoking status: Never Smoker   . Smokeless tobacco: Not on file  . Alcohol Use: No     Comment: occasionally   OB  History    No data available     Review of Systems  All other systems reviewed and are negative.     Allergies  Citalopram  Home Medications   Prior to Admission medications   Medication Sig Start Date End Date Taking? Authorizing Provider  ibuprofen (ADVIL,MOTRIN) 200 MG tablet Take 400 mg by mouth every 6 (six) hours as needed for headache, mild pain or moderate pain.   Yes Historical Provider, MD  LORazepam (ATIVAN) 1 MG tablet Take 1 tablet (1 mg total) by mouth every 8 (eight) hours. 08/31/14  Yes Camelia Eng Tysinger, PA-C  doxycycline (VIBRAMYCIN) 100 MG capsule Take 1 capsule (100 mg total) by mouth 2 (two) times daily. Patient not taking: Reported on 04/22/2015 01/19/15   Elmyra Ricks Pisciotta, PA-C  doxycycline (VIBRAMYCIN) 100 MG capsule Take 1 capsule (100 mg total) by mouth 2 (two) times daily. Patient not taking: Reported on 04/22/2015 01/19/15   Delos Haring, PA-C  metroNIDAZOLE (FLAGYL) 500 MG tablet Take 1 tablet (500 mg total) by mouth 2 (two) times daily. Patient not taking: Reported on 04/22/2015 01/19/15   Elmyra Ricks Pisciotta, PA-C  metroNIDAZOLE (FLAGYL) 500 MG tablet Take 1 tablet (500 mg total) by mouth 2 (two) times daily. Patient not taking: Reported on 04/22/2015 01/19/15   Delos Haring, PA-C  naproxen (NAPROSYN) 500 MG tablet Take 1 tablet (500 mg total) by mouth 2 (two) times daily with a meal. As needed for pain 04/22/15   Dorie Rank, MD  oxyCODONE-acetaminophen (PERCOCET/ROXICET) 5-325 MG per tablet 1 to 2 tabs PO q6hrs  PRN for pain Patient not taking: Reported on 04/22/2015 01/19/15   Elmyra Ricks Pisciotta, PA-C  oxyCODONE-acetaminophen (PERCOCET/ROXICET) 5-325 MG per tablet Take 1 tablet by mouth every 6 (six) hours as needed for severe pain. Patient not taking: Reported on 04/22/2015 01/19/15   Delos Haring, PA-C  sertraline (ZOLOFT) 50 MG tablet Take 1 tablet (50 mg total) by mouth daily. Patient not taking: Reported on 04/22/2015 09/22/14   Camelia Eng Tysinger, PA-C  traZODone  (DESYREL) 50 MG tablet Take 0.5-1 tablets (25-50 mg total) by mouth at bedtime as needed for sleep. Patient not taking: Reported on 04/22/2015 09/23/14   Camelia Eng Tysinger, PA-C  Vitamin D, Ergocalciferol, (DRISDOL) 50000 UNITS CAPS capsule Take 1 capsule (50,000 Units total) by mouth every 7 (seven) days. Patient not taking: Reported on 04/22/2015 04/17/14   Camelia Eng Tysinger, PA-C  zolpidem (AMBIEN) 10 MG tablet Take 1 tablet (10 mg total) by mouth at bedtime as needed for sleep. Patient not taking: Reported on 04/22/2015 07/07/14   Camelia Eng Tysinger, PA-C   BP 138/70 mmHg  Pulse 66  Temp(Src) 98.4 F (36.9 C) (Oral)  Resp 16  Ht 4\' 11"  (1.499 m)  Wt 140 lb (63.504 kg)  BMI 28.26 kg/m2  SpO2 100%  LMP 04/08/2015 (Approximate) Physical Exam  Constitutional: She appears well-developed and well-nourished. No distress.  HENT:  Head: Normocephalic and atraumatic.  Right Ear: External ear normal.  Left Ear: External ear normal.  Eyes: Conjunctivae are normal. Right eye exhibits no discharge. Left eye exhibits no discharge. No scleral icterus.  Neck: Neck supple. No tracheal deviation present.  Cardiovascular: Normal rate, regular rhythm and intact distal pulses.   Pulmonary/Chest: Effort normal and breath sounds normal. No stridor. No respiratory distress. She has no wheezes. She has no rales.  Abdominal: Soft. Bowel sounds are normal. She exhibits no distension. There is tenderness in the right lower quadrant and left lower quadrant. There is no rebound and no guarding.  Genitourinary: Vagina normal. Cervix exhibits no motion tenderness. Right adnexum displays no mass and no tenderness. Left adnexum displays no mass and no tenderness.  Musculoskeletal: She exhibits no edema or tenderness.  Neurological: She is alert. She has normal strength. No cranial nerve deficit (no facial droop, extraocular movements intact, no slurred speech) or sensory deficit. She exhibits normal muscle tone. She displays  no seizure activity. Coordination normal.  Skin: Skin is warm and dry. No rash noted.  Psychiatric: She has a normal mood and affect.  Nursing note and vitals reviewed.   ED Course  Procedures (including critical care time) Labs Review Labs Reviewed  WET PREP, GENITAL - Abnormal; Notable for the following:    WBC, Wet Prep HPF POC MODERATE (*)    All other components within normal limits  CBC WITH DIFFERENTIAL/PLATELET - Abnormal; Notable for the following:    Hemoglobin 11.9 (*)    Platelets 464 (*)    All other components within normal limits  COMPREHENSIVE METABOLIC PANEL - Abnormal; Notable for the following:    Potassium 3.2 (*)    Total Protein 8.5 (*)    ALT 6 (*)    All other components within normal limits  URINALYSIS, ROUTINE W REFLEX MICROSCOPIC (NOT AT [facility]) - Abnormal; Notable  for the following:    Color, Urine AMBER (*)    APPearance CLOUDY (*)    Hgb urine dipstick TRACE (*)    Ketones, ur 15 (*)    Leukocytes, UA TRACE (*)    All other components within normal limits  URINE MICROSCOPIC-ADD ON - Abnormal; Notable for the following:    Squamous Epithelial / LPF FEW (*)    Bacteria, UA MANY (*)    All other components within normal limits  LIPASE, BLOOD  RPR  HIV ANTIBODY (ROUTINE TESTING)  POC URINE PREG, ED  GC/CHLAMYDIA PROBE AMP ([place]) NOT AT [facility]     MDM   Final diagnoses:  Pelvic pain in female    Planned on doing an ultrasound while the patient was in the emergency room. This test was ordered. Unfortunately, the patient has to leave because her ride has to go to work. Patient does not have any peritoneal signs. I doubt an acute surgical process. Will give her a referral to the [facility] clinic    Dorie Rank, MD 04/22/15 1540

## 2015-04-22 NOTE — ED Notes (Signed)
Bed: AI90 Expected date:  Expected time:  Means of arrival:  Comments: Ems-44 yo F, fall, hip injury

## 2015-04-22 NOTE — Discharge Instructions (Signed)

## 2015-04-22 NOTE — ED Notes (Addendum)
Patient c/o bilateral lower abd pain, low back pain on the left. Denies urinary discomfort, c/o increased frequency. Patient states this is ongoing for several months. Visit to ER on 3/29, patient dx with right ovarian mass, was advised to follow up with OBGYN. Patient did not follow up due to owing money to her MDs office. Patient states today her pain is worse. Patient states she has "not been able to eat" for two weeks, states she tries to force herself and "it just won't go down".

## 2015-04-23 LAB — GC/CHLAMYDIA PROBE AMP (~~LOC~~) NOT AT ARMC
Chlamydia: NEGATIVE
Neisseria Gonorrhea: NEGATIVE

## 2015-04-23 LAB — RPR: RPR: NONREACTIVE

## 2015-04-23 LAB — HIV ANTIBODY (ROUTINE TESTING W REFLEX): HIV SCREEN 4TH GENERATION: NONREACTIVE

## 2016-02-20 ENCOUNTER — Inpatient Hospital Stay (HOSPITAL_COMMUNITY)
Admission: EM | Admit: 2016-02-20 | Discharge: 2016-02-24 | DRG: 378 | Disposition: A | Discharge status: Home / Self Care | Payer: Managed Care, Other (non HMO) | Attending: Internal Medicine | Admitting: Internal Medicine

## 2016-02-20 DIAGNOSIS — G4700 Insomnia, unspecified: Secondary | ICD-10-CM | POA: Diagnosis present

## 2016-02-20 DIAGNOSIS — R000 Tachycardia, unspecified: Secondary | ICD-10-CM | POA: Diagnosis present

## 2016-02-20 DIAGNOSIS — D62 Acute posthemorrhagic anemia: Secondary | ICD-10-CM | POA: Diagnosis present

## 2016-02-20 DIAGNOSIS — Z3201 Encounter for pregnancy test, result positive: Secondary | ICD-10-CM

## 2016-02-20 DIAGNOSIS — I959 Hypotension, unspecified: Secondary | ICD-10-CM | POA: Diagnosis present

## 2016-02-20 DIAGNOSIS — K250 Acute gastric ulcer with hemorrhage: Secondary | ICD-10-CM | POA: Diagnosis not present

## 2016-02-20 DIAGNOSIS — R768 Other specified abnormal immunological findings in serum: Secondary | ICD-10-CM | POA: Diagnosis present

## 2016-02-20 DIAGNOSIS — N809 Endometriosis, unspecified: Secondary | ICD-10-CM | POA: Diagnosis present

## 2016-02-20 DIAGNOSIS — N390 Urinary tract infection, site not specified: Secondary | ICD-10-CM | POA: Diagnosis present

## 2016-02-20 DIAGNOSIS — B9681 Helicobacter pylori [H. pylori] as the cause of diseases classified elsewhere: Secondary | ICD-10-CM | POA: Diagnosis present

## 2016-02-20 DIAGNOSIS — R55 Syncope and collapse: Secondary | ICD-10-CM | POA: Diagnosis not present

## 2016-02-20 DIAGNOSIS — R102 Pelvic and perineal pain: Secondary | ICD-10-CM

## 2016-02-20 DIAGNOSIS — F419 Anxiety disorder, unspecified: Secondary | ICD-10-CM | POA: Diagnosis present

## 2016-02-20 DIAGNOSIS — E876 Hypokalemia: Secondary | ICD-10-CM | POA: Diagnosis not present

## 2016-02-20 DIAGNOSIS — K253 Acute gastric ulcer without hemorrhage or perforation: Secondary | ICD-10-CM | POA: Diagnosis present

## 2016-02-20 DIAGNOSIS — K922 Gastrointestinal hemorrhage, unspecified: Secondary | ICD-10-CM | POA: Diagnosis present

## 2016-02-20 NOTE — ED Provider Notes (Addendum)
CSN: [id number]     Arrival date & time 02/20/16  2333 History  By signing my name below, I, Irene Pap, attest that this documentation has been prepared under the direction and in the presence of Abbygale Lapid, MD. Electronically Signed: Irene Pap, ED Scribe. 02/20/2016. 12:05 AM.    Chief Complaint  Patient presents with  . Loss of Consciousness   Patient is a 45 y.o. female presenting with syncope. The history is provided by the patient. No language interpreter was used.  Loss of Consciousness Episode history:  Multiple Most recent episode:  Today Timing:  Constant Progression:  Unchanged Chronicity:  New Context: bowel movement   Ineffective treatments:  None tried Associated symptoms: nausea   Associated symptoms: no chest pain, no shortness of breath and no vomiting   HPI Comments: Shantavious Alger is a 45 y.o. female with a hx of ovarian cysts, laparoscopy, and anemia brought in by EMS who presents to the Emergency Department complaining of 2 episodes of LOC that lasted minutes at a time onset earlier today. Pt reports associated nausea, 1 episode of diarrhea, and abdominal pain that has since resolved. Pt states that she was urinating when she first passed out, and was attempting to have a bowel movement the second time she passed out. She states that she took a long car trip two weeks ago and did not stop. She has not tried anything for her symptoms. She denies sick contacts, cough, congestion, chest pain, sore throat, leg swelling, SOB, hemoptysis, hematuria, or dysuria. LMP 2 weeks ago, abnormal in comparison to past menstrual periods. She states that she had two periods close together which is not typical.   Per [place] controlled substance reporting system, pt has filled multiple prescriptions of Ativan, Ambien and hydrocodone 10 325 mg in the past month.   Past Medical History  Diagnosis Date  . Anxiety 1996  . Insomnia   . Ovarian cyst   . Anemia     since teenage years    Past Surgical History  Procedure Laterality Date  . Cesarean section      x2   . Wisdom tooth extraction    . Laparoscopy N/A 01/30/2014    Procedure: LAPAROSCOPY DIAGNOSTIC;  Surgeon: Lovenia Kim, MD;  Location: [facility];  Service: Gynecology;  Laterality: N/A;  . Robotic assisted laparoscopic lysis of adhesion N/A 01/30/2014    Procedure: Possible ROBOTIC ASSISTED LAPAROSCOPIC LYSIS OF ADHESION WITH LEFT SALPINGO OOPHORECTOMY;  Surgeon: Lovenia Kim, MD;  Location: [facility];  Service: Gynecology;  Laterality: N/A;   Family History  Problem Relation Age of Onset  . Aneurysm Father     brain  . Hepatitis C Father   . Cancer Maternal Aunt     breast  . Cancer Maternal Uncle     prostate?  Marland Kitchen Alcohol abuse Maternal Grandmother   . Heart disease Paternal Grandmother   . Other Paternal Grandfather     unknown   Social History  Substance Use Topics  . Smoking status: Never Smoker   . Smokeless tobacco: Not on file  . Alcohol Use: No     Comment: occasionally   OB History    No data available     Review of Systems  HENT: Negative for congestion and sore throat.   Eyes: Negative for itching.  Respiratory: Negative for cough and shortness of breath.   Cardiovascular: Positive for syncope. Negative for chest pain and leg swelling.  Gastrointestinal: Positive for nausea, abdominal pain  and diarrhea. Negative for vomiting.  Genitourinary: Negative for dysuria and hematuria.  Neurological: Positive for syncope.  All other systems reviewed and are negative.  Allergies  Citalopram  Home Medications   Prior to Admission medications   Medication Sig Start Date End Date Taking? Authorizing Provider  doxycycline (VIBRAMYCIN) 100 MG capsule Take 1 capsule (100 mg total) by mouth 2 (two) times daily. Patient not taking: Reported on 04/22/2015 01/19/15   Elmyra Ricks Pisciotta, PA-C  doxycycline (VIBRAMYCIN) 100 MG capsule Take 1 capsule (100 mg total) by mouth 2 (two) times  daily. Patient not taking: Reported on 04/22/2015 01/19/15   Delos Haring, PA-C  ibuprofen (ADVIL,MOTRIN) 200 MG tablet Take 400 mg by mouth every 6 (six) hours as needed for headache, mild pain or moderate pain.    Historical Provider, MD  LORazepam (ATIVAN) 1 MG tablet Take 1 tablet (1 mg total) by mouth every 8 (eight) hours. 08/31/14   Camelia Eng Tysinger, PA-C  metroNIDAZOLE (FLAGYL) 500 MG tablet Take 1 tablet (500 mg total) by mouth 2 (two) times daily. Patient not taking: Reported on 04/22/2015 01/19/15   Elmyra Ricks Pisciotta, PA-C  metroNIDAZOLE (FLAGYL) 500 MG tablet Take 1 tablet (500 mg total) by mouth 2 (two) times daily. Patient not taking: Reported on 04/22/2015 01/19/15   Delos Haring, PA-C  naproxen (NAPROSYN) 500 MG tablet Take 1 tablet (500 mg total) by mouth 2 (two) times daily with a meal. As needed for pain 04/22/15   Dorie Rank, MD  oxyCODONE-acetaminophen (PERCOCET/ROXICET) 5-325 MG per tablet 1 to 2 tabs PO q6hrs  PRN for pain Patient not taking: Reported on 04/22/2015 01/19/15   Elmyra Ricks Pisciotta, PA-C  oxyCODONE-acetaminophen (PERCOCET/ROXICET) 5-325 MG per tablet Take 1 tablet by mouth every 6 (six) hours as needed for severe pain. Patient not taking: Reported on 04/22/2015 01/19/15   Delos Haring, PA-C  sertraline (ZOLOFT) 50 MG tablet Take 1 tablet (50 mg total) by mouth daily. Patient not taking: Reported on 04/22/2015 09/22/14   Camelia Eng Tysinger, PA-C  traZODone (DESYREL) 50 MG tablet Take 0.5-1 tablets (25-50 mg total) by mouth at bedtime as needed for sleep. Patient not taking: Reported on 04/22/2015 09/23/14   Camelia Eng Tysinger, PA-C  Vitamin D, Ergocalciferol, (DRISDOL) 50000 UNITS CAPS capsule Take 1 capsule (50,000 Units total) by mouth every 7 (seven) days. Patient not taking: Reported on 04/22/2015 04/17/14   Camelia Eng Tysinger, PA-C  zolpidem (AMBIEN) 10 MG tablet Take 1 tablet (10 mg total) by mouth at bedtime as needed for sleep. Patient not taking: Reported on 04/22/2015  07/07/14   Camelia Eng Tysinger, PA-C   BP 91/64 mmHg  Pulse 128  Temp(Src) 98.5 F (36.9 C) (Oral)  Resp 17  SpO2 95% Physical Exam  Constitutional: She is oriented to person, place, and time. She appears well-developed and well-nourished. No distress.  HENT:  Head: Normocephalic and atraumatic.  Mouth/Throat: Oropharynx is clear and moist. No oropharyngeal exudate.  Trachea midline  Eyes: EOM are normal. Pupils are equal, round, and reactive to light.  Neck: Trachea normal and normal range of motion. Neck supple. No JVD present. Carotid bruit is not present.  Cardiovascular: Regular rhythm and intact distal pulses.  Tachycardia present.  Exam reveals no gallop and no friction rub.   No murmur heard. Pulmonary/Chest: Effort normal and breath sounds normal. No stridor. She has no wheezes. She has no rales.  Abdominal: Soft. Bowel sounds are normal. She exhibits no mass. There is no tenderness. There is no rebound  and no guarding.  Genitourinary: Rectum normal.  No hemorrhoids or gross blood noted  Musculoskeletal: Normal range of motion. She exhibits no edema.  No edema, all compartments are soft, intact DP pulses  Lymphadenopathy:    She has no cervical adenopathy.  Neurological: She is alert and oriented to person, place, and time. She has normal reflexes. No cranial nerve deficit. She exhibits normal muscle tone. Coordination normal.  Cranial nerves 2-12 intact  Skin: Skin is warm and dry. She is not diaphoretic. There is pallor.  Psychiatric: She has a normal mood and affect. Her behavior is normal.    ED Course  Procedures (including critical care time) DIAGNOSTIC STUDIES: Oxygen Saturation is 99% on RA, normal by my interpretation.    COORDINATION OF CARE:   Labs Review Labs Reviewed  CBC WITH DIFFERENTIAL/PLATELET - Abnormal; Notable for the following:    RBC 2.30 (*)    Hemoglobin 7.4 (*)    HCT 22.2 (*)    All other components within normal limits  HEPATIC FUNCTION  PANEL - Abnormal; Notable for the following:    Total Protein 6.2 (*)    Albumin 3.2 (*)    ALT 7 (*)    Total Bilirubin 0.2 (*)    Bilirubin, Direct <0.1 (*)    All other components within normal limits  I-STAT CHEM 8, ED - Abnormal; Notable for the following:    Potassium 3.4 (*)    BUN 30 (*)    Glucose, Bld 106 (*)    Hemoglobin 8.2 (*)    HCT 24.0 (*)    All other components within normal limits  I-STAT BETA HCG BLOOD, ED ([facility], [facility], [facility] ONLY) - Abnormal; Notable for the following:    I-stat hCG, quantitative 5.2 (*)    All other components within normal limits  LIPASE, BLOOD  URINALYSIS, ROUTINE W REFLEX MICROSCOPIC (NOT AT [facility])  HCG, QUANTITATIVE, PREGNANCY  I-STAT TROPOININ, ED  TYPE AND SCREEN    Imaging Review No results found. I have personally reviewed and evaluated these images and lab results as part of my medical decision-making.   EKG Interpretation   Date/Time:  Sunday Andrei Mccook 30 2017 23:47:21 EDT Ventricular Rate:  126 PR Interval:  129 QRS Duration: 63 QT Interval:  323 QTC Calculation: 468 R Axis:   70 Text Interpretation:  Sinus tachycardia Confirmed by [facility]  MD,  Vihan Santagata ([id number]) on 02/21/2016 12:06:44 AM      MDM   Filed Vitals:   02/21/16 0030 02/21/16 0045  BP: 98/57 91/64  Pulse:    Temp:    Resp: 22 17     Results for orders placed or performed during the hospital encounter of 02/20/16  CBC with Differential/Platelet  Result Value Ref Range   WBC 8.9 4.0 - 10.5 K/uL   RBC 2.30 (L) 3.87 - 5.11 MIL/uL   Hemoglobin 7.4 (L) 12.0 - 15.0 g/dL   HCT 22.2 (L) 36.0 - 46.0 %   MCV 96.5 78.0 - 100.0 fL   MCH 32.2 26.0 - 34.0 pg   MCHC 33.3 30.0 - 36.0 g/dL   RDW 12.5 11.5 - 15.5 %   Platelets 384 150 - 400 K/uL   Neutrophils Relative % 75 %   Neutro Abs 6.7 1.7 - 7.7 K/uL   Lymphocytes Relative 20 %   Lymphs Abs 1.8 0.7 - 4.0 K/uL   Monocytes Relative 4 %   Monocytes Absolute 0.3 0.1 - 1.0 K/uL   Eosinophils Relative 1 %  Eosinophils Absolute 0.0 0.0 - 0.7 K/uL   Basophils Relative 0 %   Basophils Absolute 0.0 0.0 - 0.1 K/uL  Hepatic function panel  Result Value Ref Range   Total Protein 6.2 (L) 6.5 - 8.1 g/dL   Albumin 3.2 (L) 3.5 - 5.0 g/dL   AST 19 15 - 41 U/L   ALT 7 (L) 14 - 54 U/L   Alkaline Phosphatase 39 38 - 126 U/L   Total Bilirubin 0.2 (L) 0.3 - 1.2 mg/dL   Bilirubin, Direct <0.1 (L) 0.1 - 0.5 mg/dL   Indirect Bilirubin NOT CALCULATED 0.3 - 0.9 mg/dL  Lipase, blood  Result Value Ref Range   Lipase 23 11 - 51 U/L  I-stat chem 8, ed  Result Value Ref Range   Sodium 141 135 - 145 mmol/L   Potassium 3.4 (L) 3.5 - 5.1 mmol/L   Chloride 102 101 - 111 mmol/L   BUN 30 (H) 6 - 20 mg/dL   Creatinine, Ser 0.70 0.44 - 1.00 mg/dL   Glucose, Bld 106 (H) 65 - 99 mg/dL   Calcium, Ion 1.15 1.12 - 1.23 mmol/L   TCO2 24 0 - 100 mmol/L   Hemoglobin 8.2 (L) 12.0 - 15.0 g/dL   HCT 24.0 (L) 36.0 - 46.0 %  I-Stat Beta hCG blood, ED ([facility], [facility], [facility] only)  Result Value Ref Range   I-stat hCG, quantitative 5.2 (H) <5 mIU/mL   Comment 3          I-stat troponin, ED  Result Value Ref Range   Troponin i, poc 0.00 0.00 - 0.08 ng/mL   Comment 3             Final diagnoses:  None     Results for orders placed or performed during the hospital encounter of 02/20/16  CBC with Differential/Platelet  Result Value Ref Range   WBC 8.9 4.0 - 10.5 K/uL   RBC 2.30 (L) 3.87 - 5.11 MIL/uL   Hemoglobin 7.4 (L) 12.0 - 15.0 g/dL   HCT 22.2 (L) 36.0 - 46.0 %   MCV 96.5 78.0 - 100.0 fL   MCH 32.2 26.0 - 34.0 pg   MCHC 33.3 30.0 - 36.0 g/dL   RDW 12.5 11.5 - 15.5 %   Platelets 384 150 - 400 K/uL   Neutrophils Relative % 75 %   Neutro Abs 6.7 1.7 - 7.7 K/uL   Lymphocytes Relative 20 %   Lymphs Abs 1.8 0.7 - 4.0 K/uL   Monocytes Relative 4 %   Monocytes Absolute 0.3 0.1 - 1.0 K/uL   Eosinophils Relative 1 %   Eosinophils Absolute 0.0 0.0 - 0.7 K/uL   Basophils Relative 0 %   Basophils Absolute 0.0 0.0 - 0.1 K/uL   Hepatic function panel  Result Value Ref Range   Total Protein 6.2 (L) 6.5 - 8.1 g/dL   Albumin 3.2 (L) 3.5 - 5.0 g/dL   AST 19 15 - 41 U/L   ALT 7 (L) 14 - 54 U/L   Alkaline Phosphatase 39 38 - 126 U/L   Total Bilirubin 0.2 (L) 0.3 - 1.2 mg/dL   Bilirubin, Direct <0.1 (L) 0.1 - 0.5 mg/dL   Indirect Bilirubin NOT CALCULATED 0.3 - 0.9 mg/dL  Lipase, blood  Result Value Ref Range   Lipase 23 11 - 51 U/L  hCG, quantitative, pregnancy  Result Value Ref Range   hCG, Beta Chain, Quant, S 5 (H) <5 mIU/mL  I-stat chem 8, ed  Result Value  Ref Range   Sodium 141 135 - 145 mmol/L   Potassium 3.4 (L) 3.5 - 5.1 mmol/L   Chloride 102 101 - 111 mmol/L   BUN 30 (H) 6 - 20 mg/dL   Creatinine, Ser 0.70 0.44 - 1.00 mg/dL   Glucose, Bld 106 (H) 65 - 99 mg/dL   Calcium, Ion 1.15 1.12 - 1.23 mmol/L   TCO2 24 0 - 100 mmol/L   Hemoglobin 8.2 (L) 12.0 - 15.0 g/dL   HCT 24.0 (L) 36.0 - 46.0 %  I-Stat Beta hCG blood, ED ([facility], [facility], [facility] only)  Result Value Ref Range   I-stat hCG, quantitative 5.2 (H) <5 mIU/mL   Comment 3          I-stat troponin, ED  Result Value Ref Range   Troponin i, poc 0.00 0.00 - 0.08 ng/mL   Comment 3          POC occult blood, ED  Result Value Ref Range   Fecal Occult Bld POSITIVE (A) NEGATIVE  Type and screen  Result Value Ref Range   ABO/RH(D) O POS    Antibody Screen NEG    Sample Expiration 02/24/2016    Unit Number [id number]    Blood Component Type RED CELLS,LR    Unit division 00    Status of Unit ALLOCATED    Transfusion Status OK TO TRANSFUSE    Crossmatch Result Compatible    Unit Number [id number]    Blood Component Type RED CELLS,LR    Unit division 00    Status of Unit ALLOCATED    Transfusion Status OK TO TRANSFUSE    Crossmatch Result Compatible    Unit Number [id number]    Blood Component Type RED CELLS,LR    Unit division 00    Status of Unit ALLOCATED    Transfusion Status OK TO TRANSFUSE    Crossmatch Result Compatible    Unit  Number [id number]    Blood Component Type RED CELLS,LR    Unit division 00    Status of Unit ALLOCATED    Transfusion Status OK TO TRANSFUSE    Crossmatch Result Compatible   Prepare RBC  Result Value Ref Range   Order Confirmation ORDER PROCESSED BY BLOOD BANK   ABO/Rh  Result Value Ref Range   ABO/RH(D) O POS    No results found.  Medications  pantoprazole (PROTONIX) 80 mg in sodium chloride 0.9 % 100 mL IVPB (not administered)  pantoprazole (PROTONIX) 80 mg in sodium chloride 0.9 % 250 mL (0.32 mg/mL) infusion (not administered)  0.9 %  sodium chloride infusion (not administered)  cefTRIAXone (ROCEPHIN) 1 g in dextrose 5 % 50 mL IVPB (not administered)  sodium chloride 0.9 % bolus 1,000 mL (0 mLs Intravenous Stopped 02/21/16 0205)  sodium chloride 0.9 % bolus 1,000 mL (1,000 mLs Intravenous New Bag/Given 02/21/16 0237)  sodium chloride 0.9 % bolus 1,000 mL (1,000 mLs Intravenous New Bag/Given 02/21/16 0238)    12:05 AM-Discussed treatment plan which includes labs, IV fluids, and EKG with pt at bedside and pt agreed to plan.   1:21 AM- bedside US; pt denies melena or hematochezia. However, pt states that when she was cleaned by tech today in the ED, she noticed black stools. Pt has not taken Pepto-Bismol, large amounts of ibuprofen or Goody powders in the past 7-10 days. Pt is on iron medication. She is not on [facility].    2:47 AM- GYN consult. Dr. Glo Herring requests Beta HCG repeat lab in 48 hours.  Pt is not taken HCG injections for weight loss. Pt being admitted by medicine.  MDM Reviewed: previous chart, nursing note and vitals Reviewed previous: labs Interpretation: labs, ECG and ultrasound (anemia, indeterminant HCG of 5 normal creatinine) Total time providing critical care: 75-105 minutes. This excludes time spent performing separately reportable procedures and services. Consults: admitting MD ([facility] GYN)  CRITICAL CARE Performed by: Carlisle Beers Total critical care time:  90  minutes Critical care time was exclusive of separately billable procedures and treating other patients. Critical care was necessary to treat or prevent imminent or life-threatening deterioration. Critical care was time spent personally by me on the following activities: development of treatment plan with patient and/or surrogate as well as nursing, discussions with consultants, evaluation of patient's response to treatment, examination of patient, obtaining history from patient or surrogate, ordering and performing treatments and interventions, ordering and review of laboratory studies, ordering and review of radiographic studies, pulse oximetry and re-evaluation of patient's condition.   I personally performed the services described in this documentation, which was scribed in my presence. The recorded information has been reviewed and is accurate.     Veatrice Kells, MD 02/21/16 0254  Kendall Justo, MD 02/21/16 [phone]

## 2016-02-21 ENCOUNTER — Encounter (HOSPITAL_COMMUNITY)
Admission: EM | Disposition: A | Discharge status: Home / Self Care | Payer: Self-pay | Source: Home / Self Care | Attending: Internal Medicine

## 2016-02-21 ENCOUNTER — Emergency Department (HOSPITAL_COMMUNITY): Payer: Managed Care, Other (non HMO)

## 2016-02-21 ENCOUNTER — Encounter (HOSPITAL_COMMUNITY): Payer: Self-pay | Admitting: Emergency Medicine

## 2016-02-21 DIAGNOSIS — D62 Acute posthemorrhagic anemia: Secondary | ICD-10-CM | POA: Diagnosis present

## 2016-02-21 DIAGNOSIS — I959 Hypotension, unspecified: Secondary | ICD-10-CM | POA: Diagnosis present

## 2016-02-21 DIAGNOSIS — K254 Chronic or unspecified gastric ulcer with hemorrhage: Secondary | ICD-10-CM | POA: Insufficient documentation

## 2016-02-21 DIAGNOSIS — K250 Acute gastric ulcer with hemorrhage: Principal | ICD-10-CM

## 2016-02-21 DIAGNOSIS — G4700 Insomnia, unspecified: Secondary | ICD-10-CM | POA: Diagnosis present

## 2016-02-21 DIAGNOSIS — R000 Tachycardia, unspecified: Secondary | ICD-10-CM | POA: Diagnosis present

## 2016-02-21 DIAGNOSIS — K922 Gastrointestinal hemorrhage, unspecified: Secondary | ICD-10-CM | POA: Diagnosis present

## 2016-02-21 DIAGNOSIS — E876 Hypokalemia: Secondary | ICD-10-CM | POA: Diagnosis not present

## 2016-02-21 DIAGNOSIS — R55 Syncope and collapse: Secondary | ICD-10-CM | POA: Diagnosis present

## 2016-02-21 DIAGNOSIS — B9681 Helicobacter pylori [H. pylori] as the cause of diseases classified elsewhere: Secondary | ICD-10-CM | POA: Diagnosis present

## 2016-02-21 DIAGNOSIS — Z3201 Encounter for pregnancy test, result positive: Secondary | ICD-10-CM | POA: Diagnosis present

## 2016-02-21 DIAGNOSIS — K921 Melena: Secondary | ICD-10-CM

## 2016-02-21 DIAGNOSIS — F419 Anxiety disorder, unspecified: Secondary | ICD-10-CM | POA: Diagnosis present

## 2016-02-21 DIAGNOSIS — N809 Endometriosis, unspecified: Secondary | ICD-10-CM | POA: Diagnosis present

## 2016-02-21 DIAGNOSIS — N390 Urinary tract infection, site not specified: Secondary | ICD-10-CM | POA: Diagnosis present

## 2016-02-21 HISTORY — PX: ESOPHAGOGASTRODUODENOSCOPY: SHX5428

## 2016-02-21 LAB — HEPATIC FUNCTION PANEL
ALT: 7 U/L — ABNORMAL LOW (ref 14–54)
AST: 19 U/L (ref 15–41)
Albumin: 3.2 g/dL — ABNORMAL LOW (ref 3.5–5.0)
Alkaline Phosphatase: 39 U/L (ref 38–126)
BILIRUBIN TOTAL: 0.2 mg/dL — AB (ref 0.3–1.2)
Total Protein: 6.2 g/dL — ABNORMAL LOW (ref 6.5–8.1)

## 2016-02-21 LAB — MRSA PCR SCREENING: MRSA by PCR: NEGATIVE

## 2016-02-21 LAB — CBC WITH DIFFERENTIAL/PLATELET
Basophils Absolute: 0 10*3/uL (ref 0.0–0.1)
Basophils Relative: 0 %
Eosinophils Absolute: 0 10*3/uL (ref 0.0–0.7)
Eosinophils Relative: 1 %
HCT: 22.2 % — ABNORMAL LOW (ref 36.0–46.0)
HEMOGLOBIN: 7.4 g/dL — AB (ref 12.0–15.0)
LYMPHS ABS: 1.8 10*3/uL (ref 0.7–4.0)
Lymphocytes Relative: 20 %
MCH: 32.2 pg (ref 26.0–34.0)
MCHC: 33.3 g/dL (ref 30.0–36.0)
MCV: 96.5 fL (ref 78.0–100.0)
Monocytes Absolute: 0.3 10*3/uL (ref 0.1–1.0)
Monocytes Relative: 4 %
NEUTROS PCT: 75 %
Neutro Abs: 6.7 10*3/uL (ref 1.7–7.7)
Platelets: 384 10*3/uL (ref 150–400)
RBC: 2.3 MIL/uL — AB (ref 3.87–5.11)
RDW: 12.5 % (ref 11.5–15.5)
WBC: 8.9 10*3/uL (ref 4.0–10.5)

## 2016-02-21 LAB — COMPREHENSIVE METABOLIC PANEL
ALK PHOS: 28 U/L — AB (ref 38–126)
ALT: 6 U/L — AB (ref 14–54)
AST: 13 U/L — ABNORMAL LOW (ref 15–41)
Albumin: 2.3 g/dL — ABNORMAL LOW (ref 3.5–5.0)
Anion gap: 6 (ref 5–15)
BUN: 23 mg/dL — ABNORMAL HIGH (ref 6–20)
CALCIUM: 7.3 mg/dL — AB (ref 8.9–10.3)
CO2: 21 mmol/L — AB (ref 22–32)
CREATININE: 0.61 mg/dL (ref 0.44–1.00)
Chloride: 115 mmol/L — ABNORMAL HIGH (ref 101–111)
Glucose, Bld: 97 mg/dL (ref 65–99)
Potassium: 3.7 mmol/L (ref 3.5–5.1)
SODIUM: 142 mmol/L (ref 135–145)
Total Bilirubin: 0.3 mg/dL (ref 0.3–1.2)
Total Protein: 4.4 g/dL — ABNORMAL LOW (ref 6.5–8.1)

## 2016-02-21 LAB — HCG, QUANTITATIVE, PREGNANCY: hCG, Beta Chain, Quant, S: 5 m[IU]/mL — ABNORMAL HIGH (ref <5)

## 2016-02-21 LAB — CBC
HEMATOCRIT: 13.9 % — AB (ref 36.0–46.0)
HEMATOCRIT: 15.2 % — AB (ref 36.0–46.0)
Hemoglobin: 4.5 g/dL — CL (ref 12.0–15.0)
Hemoglobin: 5 g/dL — CL (ref 12.0–15.0)
MCH: 31 pg (ref 26.0–34.0)
MCH: 31.4 pg (ref 26.0–34.0)
MCHC: 32.4 g/dL (ref 30.0–36.0)
MCHC: 32.9 g/dL (ref 30.0–36.0)
MCV: 95.9 fL (ref 78.0–100.0)
MCV: 95.6 fL (ref 78.0–100.0)
PLATELETS: 248 10*3/uL (ref 150–400)
Platelets: 239 10*3/uL (ref 150–400)
RBC: 1.45 MIL/uL — ABNORMAL LOW (ref 3.87–5.11)
RBC: 1.59 MIL/uL — ABNORMAL LOW (ref 3.87–5.11)
RDW: 12.7 % (ref 11.5–15.5)
RDW: 13.1 % (ref 11.5–15.5)
WBC: 9 10*3/uL (ref 4.0–10.5)
WBC: 8.6 10*3/uL (ref 4.0–10.5)

## 2016-02-21 LAB — URINE MICROSCOPIC-ADD ON

## 2016-02-21 LAB — PROTIME-INR
INR: 1.32 (ref 0.00–1.49)
PROTHROMBIN TIME: 16.5 s — AB (ref 11.6–15.2)

## 2016-02-21 LAB — URINALYSIS, ROUTINE W REFLEX MICROSCOPIC
BILIRUBIN URINE: NEGATIVE
Glucose, UA: NEGATIVE mg/dL
KETONES UR: 15 mg/dL — AB
Nitrite: NEGATIVE
PH: 7 (ref 5.0–8.0)
Protein, ur: NEGATIVE mg/dL
Specific Gravity, Urine: 1.02 (ref 1.005–1.030)

## 2016-02-21 LAB — I-STAT CHEM 8, ED
BUN: 30 mg/dL — AB (ref 6–20)
CHLORIDE: 102 mmol/L (ref 101–111)
Calcium, Ion: 1.15 mmol/L (ref 1.12–1.23)
Creatinine, Ser: 0.7 mg/dL (ref 0.44–1.00)
Glucose, Bld: 106 mg/dL — ABNORMAL HIGH (ref 65–99)
HCT: 24 % — ABNORMAL LOW (ref 36.0–46.0)
HEMOGLOBIN: 8.2 g/dL — AB (ref 12.0–15.0)
Potassium: 3.4 mmol/L — ABNORMAL LOW (ref 3.5–5.1)
SODIUM: 141 mmol/L (ref 135–145)
TCO2: 24 mmol/L (ref 0–100)

## 2016-02-21 LAB — LIPASE, BLOOD: LIPASE: 23 U/L (ref 11–51)

## 2016-02-21 LAB — LACTIC ACID, PLASMA: LACTIC ACID, VENOUS: 1 mmol/L (ref 0.5–2.0)

## 2016-02-21 LAB — I-STAT BETA HCG BLOOD, ED (MC, WL, AP ONLY): HCG, QUANTITATIVE: 5.2 m[IU]/mL — AB (ref <5)

## 2016-02-21 LAB — I-STAT TROPONIN, ED: TROPONIN I, POC: 0 ng/mL (ref 0.00–0.08)

## 2016-02-21 LAB — ABO/RH: ABO/RH(D): O POS

## 2016-02-21 LAB — GLUCOSE, CAPILLARY: Glucose-Capillary: 77 mg/dL (ref 65–99)

## 2016-02-21 LAB — POC OCCULT BLOOD, ED: Fecal Occult Bld: POSITIVE — AB

## 2016-02-21 LAB — PREPARE RBC (CROSSMATCH)

## 2016-02-21 SURGERY — EGD (ESOPHAGOGASTRODUODENOSCOPY)
Anesthesia: Moderate Sedation

## 2016-02-21 MED ORDER — SODIUM CHLORIDE 0.9 % IV SOLN
8.0000 mg/h | INTRAVENOUS | Status: AC
  Administered 2016-02-21: 8 mg/h via INTRAVENOUS
  Administered 2016-02-21: 32 mg/h via INTRAVENOUS
  Administered 2016-02-22 – 2016-02-23 (×4): 8 mg/h via INTRAVENOUS
  Filled 2016-02-21 (×14): qty 80

## 2016-02-21 MED ORDER — ACETAMINOPHEN 325 MG PO TABS: 650.0000 mg | ORAL_TABLET | Freq: Four times a day (QID) | ORAL | Status: DC | PRN

## 2016-02-21 MED ORDER — EPINEPHRINE HCL 0.1 MG/ML IJ SOSY
PREFILLED_SYRINGE | INTRAMUSCULAR | Status: AC
  Filled 2016-02-21: qty 10

## 2016-02-21 MED ORDER — EPINEPHRINE HCL 0.1 MG/ML IJ SOSY
PREFILLED_SYRINGE | INTRAMUSCULAR | Status: DC | PRN
  Administered 2016-02-21: 1 mL

## 2016-02-21 MED ORDER — SODIUM CHLORIDE 0.9 % IV BOLUS (SEPSIS)
1000.0000 mL | Freq: Once | INTRAVENOUS | Status: AC
  Administered 2016-02-21: 1000 mL via INTRAVENOUS

## 2016-02-21 MED ORDER — DIPHENHYDRAMINE HCL 50 MG/ML IJ SOLN
INTRAMUSCULAR | Status: DC | PRN
  Administered 2016-02-21 (×2): 12.5 mg via INTRAVENOUS

## 2016-02-21 MED ORDER — FENTANYL CITRATE (PF) 100 MCG/2ML IJ SOLN
INTRAMUSCULAR | Status: DC | PRN
  Administered 2016-02-21: 50 ug via INTRAVENOUS
  Administered 2016-02-21 (×6): 25 ug via INTRAVENOUS

## 2016-02-21 MED ORDER — FENTANYL CITRATE (PF) 100 MCG/2ML IJ SOLN
INTRAMUSCULAR | Status: AC
  Filled 2016-02-21: qty 2

## 2016-02-21 MED ORDER — DEXTROSE 5 % IV SOLN
1.0000 g | Freq: Once | INTRAVENOUS | Status: AC
  Administered 2016-02-21: 1 g via INTRAVENOUS
  Filled 2016-02-21: qty 10

## 2016-02-21 MED ORDER — DIPHENHYDRAMINE HCL 50 MG/ML IJ SOLN
INTRAMUSCULAR | Status: AC
  Filled 2016-02-21: qty 1

## 2016-02-21 MED ORDER — MIDAZOLAM HCL 5 MG/ML IJ SOLN
INTRAMUSCULAR | Status: AC
  Filled 2016-02-21: qty 2

## 2016-02-21 MED ORDER — SODIUM CHLORIDE 0.9 % IV SOLN
INTRAVENOUS | Status: AC
  Administered 2016-02-21 (×3): via INTRAVENOUS

## 2016-02-21 MED ORDER — ACETAMINOPHEN 650 MG RE SUPP: 650.0000 mg | Freq: Four times a day (QID) | RECTAL | Status: DC | PRN

## 2016-02-21 MED ORDER — MIDAZOLAM HCL 10 MG/2ML IJ SOLN
INTRAMUSCULAR | Status: DC | PRN
  Administered 2016-02-21 (×3): 2 mg via INTRAVENOUS
  Administered 2016-02-21 (×5): 1 mg via INTRAVENOUS

## 2016-02-21 MED ORDER — SODIUM CHLORIDE 0.9 % IV BOLUS (SEPSIS)
1000.0000 mL | Freq: Once | INTRAVENOUS | Status: AC
Start: 2016-02-21 — End: 2016-02-21
  Administered 2016-02-21: 1000 mL via INTRAVENOUS

## 2016-02-21 MED ORDER — PANTOPRAZOLE SODIUM 40 MG IV SOLR
80.0000 mg | Freq: Once | INTRAVENOUS | Status: AC
  Administered 2016-02-21: 80 mg via INTRAVENOUS
  Filled 2016-02-21: qty 80

## 2016-02-21 MED ORDER — SODIUM CHLORIDE 0.9 % IV SOLN: INTRAVENOUS | Status: DC

## 2016-02-21 MED ORDER — SODIUM CHLORIDE 0.9 % IV SOLN
10.0000 mL/h | Freq: Once | INTRAVENOUS | Status: AC
  Administered 2016-02-21: 125 mL/h via INTRAVENOUS

## 2016-02-21 MED ORDER — CEFTRIAXONE SODIUM 1 G IJ SOLR
1.0000 g | INTRAMUSCULAR | Status: DC
  Administered 2016-02-22 – 2016-02-24 (×3): 1 g via INTRAVENOUS
  Filled 2016-02-21 (×3): qty 10

## 2016-02-21 MED ORDER — BUTAMBEN-TETRACAINE-BENZOCAINE 2-2-14 % EX AERO
INHALATION_SPRAY | CUTANEOUS | Status: DC | PRN
  Administered 2016-02-21: 2 via TOPICAL

## 2016-02-21 MED ORDER — SODIUM CHLORIDE 0.9 % IV SOLN
Freq: Once | INTRAVENOUS | Status: AC
  Administered 2016-02-21: 08:00:00 via INTRAVENOUS

## 2016-02-21 NOTE — ED Notes (Signed)
RN walked in for hourly round and found PRBC at bedside. PRBC was picked up from blood bank at 0650 this AM. Blood bank called and made aware.

## 2016-02-21 NOTE — ED Notes (Signed)
Dr. Silverio Decamp and endoscopy at bedside for procedure at this time.

## 2016-02-21 NOTE — ED Notes (Signed)
Pt placed on bedpan but unable to urinate. Will attempt again in 15-30min.

## 2016-02-21 NOTE — ED Notes (Signed)
Attempted to contact us tech for a possible ETA. No answer.

## 2016-02-21 NOTE — H&P (Addendum)
History and Physical    Kimberly Bruce [id number] DOB: 1970/11/18 DOA: 02/20/2016  Referring MD/NP/PA: Dr.Palumbo. PCP: Angelica Chessman, MD  Outpatient Specialists: Dr.Taavon.OB- Gyn. Patient coming from: Home.   Chief Complaint: Loss of consciousness.  HPI: Kimberly Bruce is a 45 y.o. female with medical history significant of with history of endometriosis presents to the ER after patient had 2 episodes of syncope. Last evening while at home with her friend patient initially lost consciousness while patient was trying to urinate. She states she is numerous sitting on the commode and she passed out. Before walking to the bathroom she was feeling dizzy and warm. Following which her friend her bladder to the bed. A few minutes later patient tried to go to the bathroom again to move bowels when she lost consciousness during before she made it to the bathroom. At this point EMS was called and patient was brought to the ER. In the ER patient was found to have black tarry stools continue herself and guaiac was positive. Hemoglobin was around 7 and patient's Lasix dose around 11.9 in June 2016. Patient is also mildly hypotensive and tachycardic. Patient was given 3 L fluid bolus and admitted for acute GI bleed. EKG shows sinus tachycardia and pregnancy screen is mildly positive but sonogram is negative for any pregnancy. On-call OB/GYN doctor for abuse and was consulted by ER physician who at this time is advised serial beta-hCGs. Patient at this time has declined blood transfusion but will consider if patient's hemoglobin further falls or becomes more hypotensive.  ED Course: Patient was given 3 L fluid bolus and started on infusion and patient was also started on IV Protonix infusion.  Review of Systems: As per HPI otherwise 10 point review of systems negative.    Past Medical History  Diagnosis Date  . Anxiety 1996  . Insomnia   . Ovarian cyst   . Anemia     since teenage years    Past  Surgical History  Procedure Laterality Date  . Cesarean section      x2   . Wisdom tooth extraction    . Laparoscopy N/A 01/30/2014    Procedure: LAPAROSCOPY DIAGNOSTIC;  Surgeon: Lovenia Kim, MD;  Location: [facility];  Service: Gynecology;  Laterality: N/A;  . Robotic assisted laparoscopic lysis of adhesion N/A 01/30/2014    Procedure: Possible ROBOTIC ASSISTED LAPAROSCOPIC LYSIS OF ADHESION WITH LEFT SALPINGO OOPHORECTOMY;  Surgeon: Lovenia Kim, MD;  Location: [facility];  Service: Gynecology;  Laterality: N/A;     reports that she has never smoked. She does not have any smokeless tobacco history on file. She reports that she does not drink alcohol or use illicit drugs.  Allergies  Allergen Reactions  . Citalopram Nausea And Vomiting    Family History  Problem Relation Age of Onset  . Aneurysm Father     brain  . Hepatitis C Father   . Cancer Maternal Aunt     breast  . Cancer Maternal Uncle     prostate?  Marland Kitchen Alcohol abuse Maternal Grandmother   . Heart disease Paternal Grandmother   . Other Paternal Grandfather     unknown    Prior to Admission medications   Medication Sig Start Date End Date Taking? Authorizing Provider  HYDROcodone-acetaminophen (NORCO) 10-325 MG tablet Take 0.5-1 tablets by mouth every 8 (eight) hours as needed for moderate pain.  02/18/16  Yes Historical Provider, MD  LORazepam (ATIVAN) 1 MG tablet Take 1 tablet (1 mg total)  by mouth every 8 (eight) hours. Patient taking differently: Take 1 mg by mouth daily.  08/31/14  Yes Camelia Eng Tysinger, PA-C  zolpidem (AMBIEN) 10 MG tablet Take 1 tablet (10 mg total) by mouth at bedtime as needed for sleep. 07/07/14  Yes Camelia Eng Tysinger, PA-C  doxycycline (VIBRAMYCIN) 100 MG capsule Take 1 capsule (100 mg total) by mouth 2 (two) times daily. Patient not taking: Reported on 04/22/2015 01/19/15   Elmyra Ricks Pisciotta, PA-C  doxycycline (VIBRAMYCIN) 100 MG capsule Take 1 capsule (100 mg total) by mouth 2 (two) times  daily. Patient not taking: Reported on 04/22/2015 01/19/15   Delos Haring, PA-C  metroNIDAZOLE (FLAGYL) 500 MG tablet Take 1 tablet (500 mg total) by mouth 2 (two) times daily. Patient not taking: Reported on 04/22/2015 01/19/15   Elmyra Ricks Pisciotta, PA-C  metroNIDAZOLE (FLAGYL) 500 MG tablet Take 1 tablet (500 mg total) by mouth 2 (two) times daily. Patient not taking: Reported on 04/22/2015 01/19/15   Delos Haring, PA-C  naproxen (NAPROSYN) 500 MG tablet Take 1 tablet (500 mg total) by mouth 2 (two) times daily with a meal. As needed for pain Patient not taking: Reported on 02/21/2016 04/22/15   Dorie Rank, MD  oxyCODONE-acetaminophen (PERCOCET/ROXICET) 5-325 MG per tablet 1 to 2 tabs PO q6hrs  PRN for pain Patient not taking: Reported on 04/22/2015 01/19/15   Elmyra Ricks Pisciotta, PA-C  oxyCODONE-acetaminophen (PERCOCET/ROXICET) 5-325 MG per tablet Take 1 tablet by mouth every 6 (six) hours as needed for severe pain. Patient not taking: Reported on 04/22/2015 01/19/15   Delos Haring, PA-C  sertraline (ZOLOFT) 50 MG tablet Take 1 tablet (50 mg total) by mouth daily. Patient not taking: Reported on 04/22/2015 09/22/14   Camelia Eng Tysinger, PA-C  traZODone (DESYREL) 50 MG tablet Take 0.5-1 tablets (25-50 mg total) by mouth at bedtime as needed for sleep. Patient not taking: Reported on 04/22/2015 09/23/14   Camelia Eng Tysinger, PA-C  Vitamin D, Ergocalciferol, (DRISDOL) 50000 UNITS CAPS capsule Take 1 capsule (50,000 Units total) by mouth every 7 (seven) days. Patient not taking: Reported on 04/22/2015 04/17/14   Carlena Hurl, PA-C    Physical Exam: Filed Vitals:   02/21/16 0230 02/21/16 0245 02/21/16 0300 02/21/16 0345  BP: 103/63 99/50 97/51  96/66  Pulse: 95 109 65   Temp:      TempSrc:      Resp: 15 19 15 17   SpO2: 100% 100% 100% 95%      Constitutional: NAD, calm, comfortable Filed Vitals:   02/21/16 0230 02/21/16 0245 02/21/16 0300 02/21/16 0345  BP: 103/63 99/50 97/51  96/66  Pulse: 95 109 65    Temp:      TempSrc:      Resp: 15 19 15 17   SpO2: 100% 100% 100% 95%   Eyes: PERRL, lids and conjunctivae Appears pale. ENMT: Mucous membranes are moist. Posterior pharynx clear of any exudate or lesions.Normal dentition.  Neck: normal, supple, no masses, no thyromegaly Respiratory: clear to auscultation bilaterally, no wheezing, no crackles. Normal respiratory effort. No accessory muscle use.  Cardiovascular: Regular rate and rhythm, no murmurs / rubs / gallops. No extremity edema. 2+ pedal pulses. No carotid bruits.  Abdomen: no tenderness, no masses palpated. No hepatosplenomegaly. Bowel sounds positive.  Musculoskeletal: no clubbing / cyanosis. No joint deformity upper and lower extremities. Good ROM, no contractures. Normal muscle tone.  Skin: no rashes, lesions, ulcers. No induration Neurologic: CN 2-12 grossly intact. Sensation intact, DTR normal. Strength 5/5 in all 4.  Psychiatric:  Normal judgment and insight. Alert and oriented x 3. Normal mood.    Labs on Admission: I have personally reviewed following labs and imaging studies  CBC:  Recent Labs Lab 02/21/16 0016 02/21/16 0045  WBC 8.9  --   NEUTROABS 6.7  --   HGB 7.4* 8.2*  HCT 22.2* 24.0*  MCV 96.5  --   PLT 384  --    Basic Metabolic Panel:  Recent Labs Lab 02/21/16 0045  NA 141  K 3.4*  CL 102  GLUCOSE 106*  BUN 30*  CREATININE 0.70   GFR: CrCl cannot be calculated (Unknown ideal weight.). Liver Function Tests:  Recent Labs Lab 02/21/16 0016  AST 19  ALT 7*  ALKPHOS 39  BILITOT 0.2*  PROT 6.2*  ALBUMIN 3.2*    Recent Labs Lab 02/21/16 0016  LIPASE 23   No results for input(s): AMMONIA in the last 168 hours. Coagulation Profile: No results for input(s): INR, PROTIME in the last 168 hours. Cardiac Enzymes: No results for input(s): CKTOTAL, CKMB, CKMBINDEX, TROPONINI in the last 168 hours. BNP (last 3 results) No results for input(s): PROBNP in the last 8760 hours. HbA1C: No  results for input(s): HGBA1C in the last 72 hours. CBG: No results for input(s): GLUCAP in the last 168 hours. Lipid Profile: No results for input(s): CHOL, HDL, LDLCALC, TRIG, CHOLHDL, LDLDIRECT in the last 72 hours. Thyroid Function Tests: No results for input(s): TSH, T4TOTAL, FREET4, T3FREE, THYROIDAB in the last 72 hours. Anemia Panel: No results for input(s): VITAMINB12, FOLATE, FERRITIN, TIBC, IRON, RETICCTPCT in the last 72 hours. Urine analysis:    Component Value Date/Time   COLORURINE YELLOW 02/21/2016 0225   APPEARANCEUR CLOUDY* 02/21/2016 0225   LABSPEC 1.020 02/21/2016 0225   PHURINE 7.0 02/21/2016 0225   GLUCOSEU NEGATIVE 02/21/2016 0225   HGBUR LARGE* 02/21/2016 0225   BILIRUBINUR NEGATIVE 02/21/2016 0225   BILIRUBINUR NEG 04/16/2014 1554   KETONESUR 15* 02/21/2016 0225   PROTEINUR NEGATIVE 02/21/2016 0225   PROTEINUR TRACE 04/16/2014 1554   UROBILINOGEN 1.0 04/22/2015 1157   UROBILINOGEN negative 04/16/2014 1554   NITRITE NEGATIVE 02/21/2016 0225   NITRITE NEG 04/16/2014 1554   LEUKOCYTESUR MODERATE* 02/21/2016 0225   Sepsis Labs: @LABRCNTIP (procalcitonin:4,lacticidven:4) )No results found for this or any previous visit (from the past 240 hour(s)).   Radiological Exams on Admission: US Ob Comp Less 14 Wks  02/21/2016  CLINICAL DATA:  Syncope, pelvic pain. Beta HCG 5. Status post LEFT oophorectomy. EXAM: OBSTETRIC <14 WK [place] AND TRANSVAGINAL OB US TECHNIQUE: Both transabdominal and transvaginal ultrasound examinations were performed for complete evaluation of the gestation as well as the maternal uterus, adnexal regions, and pelvic cul-de-sac. Transvaginal technique was performed to assess early pregnancy. COMPARISON:  None. FINDINGS: Intrauterine gestational sac: Not present Yolk sac:  Not present Embryo:  Not present Cardiac Activity: Not present Subchorionic hemorrhage:  None visualized. Maternal uterus/adnexae: 0.8 x 1 x 0.9 cm hypoechoic intramural uterine fundal  leiomyoma. RIGHT ovary is 4.3 x 2.4 x 2.5 cm, status post LEFT oophorectomy. No free fluid. IMPRESSION: No sonographic findings of intrauterine pregnancy. Please note, pregnancy not typically sonographically evident at beta HCG less than 1,000. 1 cm uterine fundal intramural leiomyoma. Status post LEFT oophorectomy. Electronically Signed   By: Elon Alas M.D.   On: 02/21/2016 03:38   US Ob Transvaginal  02/21/2016  CLINICAL DATA:  Syncope, pelvic pain. Beta HCG 5. Status post LEFT oophorectomy. EXAM: OBSTETRIC <14 WK [place] AND TRANSVAGINAL OB US TECHNIQUE: Both  transabdominal and transvaginal ultrasound examinations were performed for complete evaluation of the gestation as well as the maternal uterus, adnexal regions, and pelvic cul-de-sac. Transvaginal technique was performed to assess early pregnancy. COMPARISON:  None. FINDINGS: Intrauterine gestational sac: Not present Yolk sac:  Not present Embryo:  Not present Cardiac Activity: Not present Subchorionic hemorrhage:  None visualized. Maternal uterus/adnexae: 0.8 x 1 x 0.9 cm hypoechoic intramural uterine fundal leiomyoma. RIGHT ovary is 4.3 x 2.4 x 2.5 cm, status post LEFT oophorectomy. No free fluid. IMPRESSION: No sonographic findings of intrauterine pregnancy. Please note, pregnancy not typically sonographically evident at beta HCG less than 1,000. 1 cm uterine fundal intramural leiomyoma. Status post LEFT oophorectomy. Electronically Signed   By: Elon Alas M.D.   On: 02/21/2016 03:38    EKG: Independently reviewed. Sinus tachycardia.  Assessment/Plan Principal Problem:   Acute GI bleeding Active Problems:   GIB (gastrointestinal bleeding)   Acute blood loss anemia   Pregnancy test positive    #1. Acute GI bleeding - patient states she has taken naproxen probably last week. Given the black tarry stools and low hemoglobin at this time GI bleed probably from upper GI source. Patient has been placed on Protonix infusion. Patient at  this time has refused blood transfusion but will consider if there is further decline in hemoglobin of patient becomes critically ill. Will consult gastroenterologist for earlier endoscopy. Patient will be kept nothing by mouth in anticipation of procedure. Continue with IV hydration and repeat CBC has been ordered. Addendum - I have discussed with on-call gastroenterologist Dr. Loletha Carrow who will be seeing patient as an urgent consult. #2. Acute blood loss anemia from GI bleed - follow CBC see #1. #3. Syncope probably from GI bleed - closely monitor and stepdown. #4. Pregnancy screen positive - ER physician had discussed with on-call OB/GYN Dr. Glo Herring who has advised serial beta-hCGs since sonogram will not be positive for low levels of beta-hCGs. #5. Bacteriuria with possible UTI - patient's urine shows bacteria with leukocyte esterase positive. Since patient is also pregnant I have placed patient on ceftriaxone. Check urine cultures.  I have confirmed with pharmacy about the safety of medications ordered for pregnancy.  Addendum - patient has initially diffuse transfusion. Repeat hemoglobin is around 4.5. Patient is still tachycardic with blood pressure in the 0000000 systolic. Patient at this time as agreed but transfusion after risks were discussed. I have ordered 3 units of packed red blood cell transfusion. I have discussed with Dr. Loletha Carrow on-call gastroenterologist again. I have also consulted pulmonary critical care Dr. Ashok Cordia.   DVT prophylaxis:  SCDs because patient has GI bleed. Code Status:  Full code.  Family Communication:  No family at the bedside.  Disposition Plan:  Home.  Consults called:  Will call GI.  Admission status:  Inpatient. Stepdown unit.    Rise Patience MD [facility] Pager [phone].  If 7PM-7AM, please contact night-coverage [email or web address] Password [email or web address]  02/21/2016, 4:56 AM

## 2016-02-21 NOTE — ED Notes (Addendum)
Spoke with pt about receiving a blood transfusion. Explained that she needed to receive blood, and that the hospital could not accommodate her desire that it be a family member. I explained the possible risks to receiving a transfusion, as well as the inherent risks to refusing. Pt voiced her desire to "think about it a bit".

## 2016-02-21 NOTE — Consult Note (Signed)
[facility] Consult: 8:40 AM 02/21/2016  LOS: 0 days    Referring Provider: Dr Clementeen Graham  Primary Care Physician:  Angelica Chessman, MD Primary Gastroenterologist:  Althia Forts.      Reason for Consultation:  GI bleed.    HPI: Kimberly Bruce is a 45 y.o. female.  Hx endometriosis, s/p exporatory laparoscopy and LOA in 01/2014.  S/p left oophorectomy.  Anemia since teens.    About 1 week of progressing fatigue and light headedness.  Stools brown, appetite WNL.  Experienced syncope twice yesterday.  During 2nd episode, she was incontinent of melenic stool.  Previous last BM was on 4/28 or 4/29 and was brown.  No abd pain.  No N/V.  Using Motrin sporadically in the last 4 to 5 weeks, not able to quantify but 200 mg per dose and not using every day.  Previously advised to take po iron by [employer], has not been using for many years.    Hgb initially 7.4, this AM it is 4.5.  Initially refused transfusion, but she is now consenting.  Hgb 11 months ago was 11.9.   MCV, platelets and coags all normal.  FOBT +.   BUN elevated to 30.   HCG is 5 (<[redacted] week gestation). Pelvic ultrasound negative for pregnancy, + for fibroid.   BPs 90s /50s.  Pulses to 130s.    FM Hx + for PUD in her mom.  Sister died at 29 from blood cancer.      Past Medical History  Diagnosis Date  . Anxiety 1996  . Insomnia   . Ovarian cyst   . Anemia     since teenage years    Past Surgical History  Procedure Laterality Date  . Cesarean section      x2   . Wisdom tooth extraction    . Laparoscopy N/A 01/30/2014    Procedure: LAPAROSCOPY DIAGNOSTIC;  Surgeon: Lovenia Kim, MD;  Location: [facility];  Service: Gynecology;  Laterality: N/A;  . Robotic assisted laparoscopic lysis of adhesion N/A 01/30/2014    Procedure: Possible ROBOTIC ASSISTED  LAPAROSCOPIC LYSIS OF ADHESION WITH LEFT SALPINGO OOPHORECTOMY;  Surgeon: Lovenia Kim, MD;  Location: [facility];  Service: Gynecology;  Laterality: N/A;    Prior to Admission medications   Medication Sig Start Date End Date Taking? Authorizing Provider  HYDROcodone-acetaminophen (NORCO) 10-325 MG tablet Take 0.5-1 tablets by mouth every 8 (eight) hours as needed for moderate pain.  02/18/16  Yes Historical Provider, MD  LORazepam (ATIVAN) 1 MG tablet Take 1 tablet (1 mg total) by mouth every 8 (eight) hours. Patient taking differently: Take 1 mg by mouth daily.  08/31/14  Yes Camelia Eng Tysinger, PA-C  zolpidem (AMBIEN) 10 MG tablet Take 1 tablet (10 mg total) by mouth at bedtime as needed for sleep. 07/07/14  Yes Camelia Eng Tysinger, PA-C  doxycycline (VIBRAMYCIN) 100 MG capsule Take 1 capsule (100 mg total) by mouth 2 (two) times daily. Patient not taking: Reported on 04/22/2015 01/19/15   Elmyra Ricks Pisciotta, PA-C  doxycycline (VIBRAMYCIN) 100 MG capsule Take  1 capsule (100 mg total) by mouth 2 (two) times daily. Patient not taking: Reported on 04/22/2015 01/19/15   Delos Haring, PA-C  metroNIDAZOLE (FLAGYL) 500 MG tablet Take 1 tablet (500 mg total) by mouth 2 (two) times daily. Patient not taking: Reported on 04/22/2015 01/19/15   Elmyra Ricks Pisciotta, PA-C  metroNIDAZOLE (FLAGYL) 500 MG tablet Take 1 tablet (500 mg total) by mouth 2 (two) times daily. Patient not taking: Reported on 04/22/2015 01/19/15   Delos Haring, PA-C  naproxen (NAPROSYN) 500 MG tablet Take 1 tablet (500 mg total) by mouth 2 (two) times daily with a meal. As needed for pain Patient not taking: Reported on 02/21/2016 04/22/15   Dorie Rank, MD  oxyCODONE-acetaminophen (PERCOCET/ROXICET) 5-325 MG per tablet 1 to 2 tabs PO q6hrs  PRN for pain Patient not taking: Reported on 04/22/2015 01/19/15   Elmyra Ricks Pisciotta, PA-C  oxyCODONE-acetaminophen (PERCOCET/ROXICET) 5-325 MG per tablet Take 1 tablet by mouth every 6 (six) hours as needed for severe  pain. Patient not taking: Reported on 04/22/2015 01/19/15   Delos Haring, PA-C  sertraline (ZOLOFT) 50 MG tablet Take 1 tablet (50 mg total) by mouth daily. Patient not taking: Reported on 04/22/2015 09/22/14   Camelia Eng Tysinger, PA-C  traZODone (DESYREL) 50 MG tablet Take 0.5-1 tablets (25-50 mg total) by mouth at bedtime as needed for sleep. Patient not taking: Reported on 04/22/2015 09/23/14   Camelia Eng Tysinger, PA-C  Vitamin D, Ergocalciferol, (DRISDOL) 50000 UNITS CAPS capsule Take 1 capsule (50,000 Units total) by mouth every 7 (seven) days. Patient not taking: Reported on 04/22/2015 04/17/14   Camelia Eng Tysinger, PA-C    Scheduled Meds:   Infusions: . sodium chloride 125 mL/hr at 02/21/16 0500  . [START ON 02/22/2016] cefTRIAXone (ROCEPHIN)  IV    . pantoprozole (PROTONIX) infusion 32 mg/hr (02/21/16 0434)   PRN Meds: acetaminophen **OR** acetaminophen   Allergies as of 02/20/2016 - Review Complete 04/22/2015  Allergen Reaction Noted  . Citalopram Nausea And Vomiting 04/07/2013    Family History  Problem Relation Age of Onset  . Aneurysm Father     brain  . Hepatitis C Father   . Cancer Maternal Aunt     breast  . Cancer Maternal Uncle     prostate?  Marland Kitchen Alcohol abuse Maternal Grandmother   . Heart disease Paternal Grandmother   . Other Paternal Grandfather     unknown    Social History   Social History  . Marital Status: Single    Spouse Name: N/A  . Number of Children: N/A  . Years of Education: N/A   Occupational History  . Not on file.   Social History Main Topics  . Smoking status: Never Smoker   . Smokeless tobacco: Not on file  . Alcohol Use: No     Comment: occasionally  . Drug Use: No  . Sexual Activity: No   Other Topics Concern  . Not on file   Social History Narrative   Her 15yo daughter [name] lives with her, [name], works as Employment IT sales professional at [employer], exercising 5 days per week.  Likes to do  kickboxing.  In relationship 2 years.     REVIEW OF SYSTEMS: Constitutional:  Per HPI.  Generally no activity limitation.  Weight stable ENT:  No nose bleeds Pulm:  No DOE or cough CV:  No palpitations, no LE edema.  GU:  No hematuria, no frequency GI:  Per HPI.  No dysphagia. Heme:  Per HPI  Transfusions:  None before today.   Neuro:  No headaches, no peripheral tingling or numbness Derm:  No itching, no rash or sores.  Endocrine:  No sweats or chills.  No polyuria or dysuria Immunization:  Not queried.  Travel:  None beyond local counties in last few months.    PHYSICAL EXAM: Vital signs in last 24 hours: Filed Vitals:   2016/02/29 0809 02-29-2016 0815  BP: 96/63 103/56  Pulse: 109 106  Temp: 99.1 F (37.3 C)   Resp: 18 19   Wt Readings from Last 3 Encounters:  04/22/15 63.504 kg (140 lb)  04/16/14 57.607 kg (127 lb)  01/09/14 63.957 kg (141 lb)    General: Pleasant, comfortable, well appearing AAF Head:  No swelling or asymmetry.  No trauma  Eyes:  + conj pallor Ears:  Not HOH  Nose:  No discharge Mouth:  Clear, moist.  Good dentition.  Neck:  No mass, no JVD.   Lungs:  Clear bil.  Unlabored breathing Heart: RRR with soft, 1/6, SEM.  Abdomen:  Soft, NT, ND.  No mass, hernia or HSM.   Rectal: deferred.  Melenic stool per ED provider.    Musc/Skeltl: no joint swelling, redness or deformities Extremities:  No CCE  Neurologic:  Oriented x 3.  No limb weakness.  Grossly intact neurologically Skin:  No telangecstasia, sores or rashes   Psych:  Pleasant, cooperative.  In good spirits.    Intake/Output from previous day: 04/30 0701 - 2023/03/01 0700 In: 2000 [I.V.:1000; IV Piggyback:1000] Out: 200 [Urine:200] Intake/Output this shift: Total I/O In: 335 [Blood:335] Out: -   LAB RESULTS:  Recent Labs  2016/02/29 0016 02/29/2016 0045 Feb 29, 2016 0537  WBC 8.9  --  9.0  HGB 7.4* 8.2* 4.5*  HCT 22.2* 24.0* 13.9*  PLT 384  --  248   BMET Lab Results  Component Value  Date   NA 142 2016-02-29   NA 141 2016-02-29   NA 142 04/22/2015   K 3.7 Feb 29, 2016   K 3.4* 02/29/2016   K 3.2* 04/22/2015   CL 115* 02-29-2016   CL 102 2016/02/29   CL 104 04/22/2015   CO2 21* 02/29/16   CO2 27 04/22/2015   CO2 27 01/19/2015   GLUCOSE 97 02/29/2016   GLUCOSE 106* 29-Feb-2016   GLUCOSE 78 04/22/2015   BUN 23* Feb 29, 2016   BUN 30* 2016/02/29   BUN 10 04/22/2015   CREATININE 0.61 02-29-16   CREATININE 0.70 Feb 29, 2016   CREATININE 0.65 04/22/2015   CALCIUM 7.3* 02-29-2016   CALCIUM 9.5 04/22/2015   CALCIUM 9.5 01/19/2015   LFT  Recent Labs  2016-02-29 0016 2016/02/29 0537  PROT 6.2* 4.4*  ALBUMIN 3.2* 2.3*  AST 19 13*  ALT 7* 6*  ALKPHOS 39 28*  BILITOT 0.2* 0.3  BILIDIR <0.1*  --   IBILI NOT CALCULATED  --    PT/INR Lab Results  Component Value Date   INR 1.32 02/29/16   Hepatitis Panel No results for input(s): HEPBSAG, HCVAB, HEPAIGM, HEPBIGM in the last 72 hours. C-Diff No components found for: CDIFF Lipase     Component Value Date/Time   LIPASE 23 02-29-2016 0016    Drugs of Abuse  No results found for: LABOPIA, COCAINSCRNUR, LABBENZ, AMPHETMU, THCU, LABBARB   RADIOLOGY STUDIES: US Ob Comp Less 14 Wks  02-29-16  CLINICAL DATA:  Syncope, pelvic pain. Beta HCG 5. Status post LEFT oophorectomy. EXAM: OBSTETRIC <14 WK [place] AND TRANSVAGINAL OB US TECHNIQUE: Both transabdominal and transvaginal ultrasound examinations  were performed for complete evaluation of the gestation as well as the maternal uterus, adnexal regions, and pelvic cul-de-sac. Transvaginal technique was performed to assess early pregnancy. COMPARISON:  None. FINDINGS: Intrauterine gestational sac: Not present Yolk sac:  Not present Embryo:  Not present Cardiac Activity: Not present Subchorionic hemorrhage:  None visualized. Maternal uterus/adnexae: 0.8 x 1 x 0.9 cm hypoechoic intramural uterine fundal leiomyoma. RIGHT ovary is 4.3 x 2.4 x 2.5 cm, status post LEFT oophorectomy.  No free fluid. IMPRESSION: No sonographic findings of intrauterine pregnancy. Please note, pregnancy not typically sonographically evident at beta HCG less than 1,000. 1 cm uterine fundal intramural leiomyoma. Status post LEFT oophorectomy. Electronically Signed   By: Elon Alas M.D.   On: 02/21/2016 03:38   US Ob Transvaginal  02/21/2016  CLINICAL DATA:  Syncope, pelvic pain. Beta HCG 5. Status post LEFT oophorectomy. EXAM: OBSTETRIC <14 WK [place] AND TRANSVAGINAL OB US TECHNIQUE: Both transabdominal and transvaginal ultrasound examinations were performed for complete evaluation of the gestation as well as the maternal uterus, adnexal regions, and pelvic cul-de-sac. Transvaginal technique was performed to assess early pregnancy. COMPARISON:  None. FINDINGS: Intrauterine gestational sac: Not present Yolk sac:  Not present Embryo:  Not present Cardiac Activity: Not present Subchorionic hemorrhage:  None visualized. Maternal uterus/adnexae: 0.8 x 1 x 0.9 cm hypoechoic intramural uterine fundal leiomyoma. RIGHT ovary is 4.3 x 2.4 x 2.5 cm, status post LEFT oophorectomy. No free fluid. IMPRESSION: No sonographic findings of intrauterine pregnancy. Please note, pregnancy not typically sonographically evident at beta HCG less than 1,000. 1 cm uterine fundal intramural leiomyoma. Status post LEFT oophorectomy. Electronically Signed   By: Elon Alas M.D.   On: 02/21/2016 03:38    ENDOSCOPIC STUDIES:   IMPRESSION:   *  GIB.  With the melena and elevated BUN, presume UGI source.  R/o PUD vs AVM vs neoplasm vs occult liver disease and its associated bleeding sources (this unlikely, nothing in hx suggests she has liver dz).    *  ABL anemia.  First of 3 units PRBC now transfusing.  >25 year hx of anemia.  Though Hgb 11 months ago was 11.9.    *  Slightly hCG, inconclusive vaginal ultrasound.  ? Early pregnancy.  *  ?  UTI.  Empiric Rocephin in place.     PLAN:     *  EGD for this afternoon,  should have had repeat CBC after receiving at least 2 units by then.  PPI drip in place.  Ok for ice chips. Azucena Freed  02/21/2016, 8:40 AM Pager: [phone]      Attending physician's note   I have taken a history, examined the patient and reviewed the chart. I agree with the Advanced Practitioner's note, impression and recommendations. 73 yr F presented after episodes of melena concerning for upper GI bleed with Hgb 4.5. Receiving pRBC so far only 1 U. H/o taking NSAID's +. HCG slightly elevated, possiblity of early gestation. Patient is aware. Will plan for EGD for evaluation and possible endoscopic management. The risks and benefits as well as alternatives of endoscopic procedure(s) have been discussed and reviewed. All questions answered. The patient agrees to proceed.    Damaris Hippo, MD [phone] Mon-Fri 8a-5p [phone] after 5p, weekends, holidays

## 2016-02-21 NOTE — ED Notes (Signed)
RN walked blood to blood bank. Blood bank advised that blood could still be used if transfused by 1050 (4 hours from pick up time). RN advised patient of this information.

## 2016-02-21 NOTE — Progress Notes (Signed)
Chart reviewed, patient stabilized.  Discussed case with Dr. Clementeen Graham - she is stable for SDU, PCCM will be available if needed.  She is currently receiving PRBC's and is hemodynamically stable.  Patient is lying in bed in NAD, VSS.     Blood pressure 108/59, pulse 98, temperature 99.6 F (37.6 C), temperature source Oral, resp. rate 14, SpO2 100 %.   Noe Gens, NP-C [facility] Pgr: [phone] or if no answer [phone] 02/21/2016, 10:52 AM

## 2016-02-21 NOTE — ED Notes (Signed)
Per EMS, pt had two syncopal episodes this evening. Both came in the attempt of getting up to go to or from the bathroom. Pt reports having felt extremely weak for the last 2-3 days, and having a general lack of appetite for the past week. Pt says she goes through spells where she won't want to eat or drink anything, but has never passed out because of it. Pt also reports dark, foul-smelling vaginal discharge for the past two days. Denies N/V. +orthostatics for EMS.

## 2016-02-21 NOTE — Progress Notes (Signed)
PROGRESS NOTE                                                                                                                                                                                                             Patient Demographics:    Kimberly Bruce, is a 45 y.o. female, DOB - 1971/08/11, [id number]  Admit date - 02/20/2016   Admitting Physician Rise Patience, MD  Outpatient Primary MD for the patient is Angelica Chessman, MD  LOS - 0  Outpatient Specialists: None  Chief Complaint  Patient presents with  . Loss of Consciousness       Brief Narrative   45 year old female with history of endometriosis and irregular menstrual cycle presented to the ED with 2 episodes of syncope on the evening prior to admission while sitting on the commode to urinate. She was feeling dizzy and warm prior to that. Second episode occurred when she went to the bathroom to have a bowel movement. EMS was called and brought to the ED. She had one episode of bloody bowel movement with black tarry stool. Hemoglobin was around 7. (Was 11.9 in June 2016). She was also tachycardic and mildly hypotensive. Stool for occult blood was positive. Given 3 L IV normal saline bolus with concern for acute GI bleed. Subsequent hemoglobin dropped to 4.5.  Ordered for 3 unit PRBC. PC CM and GI consulted. Patient taken for endoscopy this afternoon and showed 4 gastric ulcers (one with visible vessel actively spurting blood in the prepyloric region of the stomach (. Large amount of blood and clots seen on the gastric fundus. Hemostasis obtained And transferred back to stepdown unit.    Subjective:    Seen and examined in the ED. Complain of feeling tired. No further artery stool.   Assessment  & Plan :    Principal Problem:   Acute Upper GI bleed Secondary to bleeding gastric ulcers. EGD done with hemostasis obtained. Continue PPI drip. H&H  every 6 hours. Receiving 3 unit PRBC. Appreciate GI recommendations. Keep nothing by mouth today and start clears tomorrow.  biopsy sent for H. Pylori.  Active Problems: Acute blood loss anemia Secondary to GI bleed. Monitor H&H was transfusion.  Endometriosis Reports heavy menses back-to-back in the past month  Syncope Secondary to GI bleed.  Bacteriuria with? UTI On empiric Rocephin. Follow cultures.  Pregnancy screen positive ED physician discussed with on-call OB doctor Glo Herring who recommended serial beta-hCGs since abdominal sonogram will not be positive for low levels of beta-hCGs.  Code Status : Full code  Family Communication  : None at bedside  Disposition Plan  : Home possibly in the next 48-72 hours  Barriers For Discharge :  Active issues  Consults  :   PC CM [facility]  Procedures  : EGD  DVT Prophylaxis  : SCDs   Lab Results  Component Value Date   PLT 239 02/21/2016    Antibiotics  :   Anti-infectives    Start     Dose/Rate Route Frequency Ordered Stop   02/22/16 0600  cefTRIAXone (ROCEPHIN) 1 g in dextrose 5 % 50 mL IVPB     1 g 100 mL/hr over 30 Minutes Intravenous Every 24 hours 02/21/16 0531     02/21/16 0345  cefTRIAXone (ROCEPHIN) 1 g in dextrose 5 % 50 mL IVPB     1 g 100 mL/hr over 30 Minutes Intravenous  Once 02/21/16 0336 02/21/16 0502        Objective:   Filed Vitals:   02/21/16 1350 02/21/16 1355 02/21/16 1400 02/21/16 1434  BP: 127/81 124/78 122/78 108/74  Pulse: 118 117 117   Temp:    97.7 F (36.5 C)  TempSrc:    Oral  Resp: 21 15 19    Height:      Weight:      SpO2: 100% 100% 100% 100%    Wt Readings from Last 3 Encounters:  02/21/16 57.153 kg (126 lb)  04/22/15 63.504 kg (140 lb)  04/16/14 57.607 kg (127 lb)     Intake/Output Summary (Last 24 hours) at 02/21/16 1508 Last data filed at 02/21/16 1434  Gross per 24 hour  Intake   2680 ml  Output    200 ml  Net   2480 ml     Physical Exam  Gen: Appears  fatigued and pale HEENT: Pallor present , dry mucosa supple neck Chest: clear b/l, no added sounds CVS: S1 and S2 tachycardic, no murmurs, rubs or gallop GI: soft, NT, ND, BS+ Musculoskeletal: warm, no edema CNS: Alert and oriented    Data Review:    CBC  Recent Labs Lab 02/21/16 0016 02/21/16 0045 02/21/16 0537 02/21/16 0854  WBC 8.9  --  9.0 8.6  HGB 7.4* 8.2* 4.5* 5.0*  HCT 22.2* 24.0* 13.9* 15.2*  PLT 384  --  248 239  MCV 96.5  --  95.9 95.6  MCH 32.2  --  31.0 31.4  MCHC 33.3  --  32.4 32.9  RDW 12.5  --  12.7 13.1  LYMPHSABS 1.8  --   --   --   MONOABS 0.3  --   --   --   EOSABS 0.0  --   --   --   BASOSABS 0.0  --   --   --     Chemistries   Recent Labs Lab 02/21/16 0016 02/21/16 0045 02/21/16 0537  NA  --  141 142  K  --  3.4* 3.7  CL  --  102 115*  CO2  --   --  21*  GLUCOSE  --  106* 97  BUN  --  30* 23*  CREATININE  --  0.70 0.61  CALCIUM  --   --  7.3*  AST 19  --  13*  ALT 7*  --  6*  ALKPHOS 39  --  28*  BILITOT 0.2*  --  0.3   ------------------------------------------------------------------------------------------------------------------ No results for input(s): CHOL, HDL, LDLCALC, TRIG, CHOLHDL, LDLDIRECT in the last 72 hours.  No results found for: HGBA1C ------------------------------------------------------------------------------------------------------------------ No results for input(s): TSH, T4TOTAL, T3FREE, THYROIDAB in the last 72 hours.  Invalid input(s): FREET3 ------------------------------------------------------------------------------------------------------------------ No results for input(s): VITAMINB12, FOLATE, FERRITIN, TIBC, IRON, RETICCTPCT in the last 72 hours.  Coagulation profile  Recent Labs Lab 02/21/16 0537  INR 1.32    No results for input(s): DDIMER in the last 72 hours.  Cardiac Enzymes No results for input(s): CKMB, TROPONINI, MYOGLOBIN in the last 168 hours.  Invalid input(s):  CK ------------------------------------------------------------------------------------------------------------------ No results found for: BNP  Inpatient Medications  Scheduled Meds: . [START ON 02/22/2016] cefTRIAXone (ROCEPHIN)  IV  1 g Intravenous Q24H   Continuous Infusions: . sodium chloride 125 mL/hr at 02/21/16 0500  . pantoprozole (PROTONIX) infusion 8 mg/hr (02/21/16 1156)   PRN Meds:.acetaminophen **OR** acetaminophen  Micro Results No results found for this or any previous visit (from the past 240 hour(s)).  Radiology Reports US Ob Comp Less 14 Wks  02/21/2016  CLINICAL DATA:  Syncope, pelvic pain. Beta HCG 5. Status post LEFT oophorectomy. EXAM: OBSTETRIC <14 WK [place] AND TRANSVAGINAL OB US TECHNIQUE: Both transabdominal and transvaginal ultrasound examinations were performed for complete evaluation of the gestation as well as the maternal uterus, adnexal regions, and pelvic cul-de-sac. Transvaginal technique was performed to assess early pregnancy. COMPARISON:  None. FINDINGS: Intrauterine gestational sac: Not present Yolk sac:  Not present Embryo:  Not present Cardiac Activity: Not present Subchorionic hemorrhage:  None visualized. Maternal uterus/adnexae: 0.8 x 1 x 0.9 cm hypoechoic intramural uterine fundal leiomyoma. RIGHT ovary is 4.3 x 2.4 x 2.5 cm, status post LEFT oophorectomy. No free fluid. IMPRESSION: No sonographic findings of intrauterine pregnancy. Please note, pregnancy not typically sonographically evident at beta HCG less than 1,000. 1 cm uterine fundal intramural leiomyoma. Status post LEFT oophorectomy. Electronically Signed   By: Elon Alas M.D.   On: 02/21/2016 03:38   US Ob Transvaginal  02/21/2016  CLINICAL DATA:  Syncope, pelvic pain. Beta HCG 5. Status post LEFT oophorectomy. EXAM: OBSTETRIC <14 WK [place] AND TRANSVAGINAL OB US TECHNIQUE: Both transabdominal and transvaginal ultrasound examinations were performed for complete evaluation of the gestation as  well as the maternal uterus, adnexal regions, and pelvic cul-de-sac. Transvaginal technique was performed to assess early pregnancy. COMPARISON:  None. FINDINGS: Intrauterine gestational sac: Not present Yolk sac:  Not present Embryo:  Not present Cardiac Activity: Not present Subchorionic hemorrhage:  None visualized. Maternal uterus/adnexae: 0.8 x 1 x 0.9 cm hypoechoic intramural uterine fundal leiomyoma. RIGHT ovary is 4.3 x 2.4 x 2.5 cm, status post LEFT oophorectomy. No free fluid. IMPRESSION: No sonographic findings of intrauterine pregnancy. Please note, pregnancy not typically sonographically evident at beta HCG less than 1,000. 1 cm uterine fundal intramural leiomyoma. Status post LEFT oophorectomy. Electronically Signed   By: Elon Alas M.D.   On: 02/21/2016 03:38    Time Spent in minutes  20   Louellen Molder M.D on 02/21/2016 at 3:08 PM  Between 7am to 7pm - Pager - [phone]  After 7pm go to [email or web address] - password [facility]  [facility] -  Office  [phone]

## 2016-02-21 NOTE — ED Notes (Signed)
Received critical lab value. Ali Lowe MD.

## 2016-02-21 NOTE — Op Note (Addendum)
[facility] Patient Name: Kimberly Bruce Procedure Date : 02/21/2016 MRN: [id number] Attending MD: Mauri Pole , MD Date of Birth: 10-24-1970 CSN: [id number] Age: 45 Admit Type: Inpatient Procedure:                Upper GI endoscopy Indications:              Melena, Active gastrointestinal bleeding Providers:                Mauri Pole, MD Referring MD:              Medicines:                Midazolam 11 mg IV, Fentanyl 200 micrograms IV,                            Diphenhydramine 25 mg IV Complications:            No immediate complications. Estimated Blood Loss:     Estimated blood loss: Greater than 100 mL requiring                            treatment with placement of hemostatic clip(s). Procedure:                Pre-Anesthesia Assessment:                           - Prior to the procedure, a History and Physical                            was performed, and patient medications and                            allergies were reviewed. The patient's tolerance of                            previous anesthesia was also reviewed. The risks                            and benefits of the procedure and the sedation                            options and risks were discussed with the patient.                            All questions were answered, and informed consent                            was obtained. Prior Anticoagulants: The patient has                            taken no previous anticoagulant or antiplatelet                            agents. ASA Grade Assessment: II - A patient with  mild systemic disease. After reviewing the risks                            and benefits, the patient was deemed in                            satisfactory condition to undergo the procedure.                           After obtaining informed consent, the endoscope was                            passed under direct vision. Throughout the                        procedure, the patient's blood pressure, pulse, and                            oxygen saturations were monitored continuously. The                            Endoscope was introduced through the mouth, and                            advanced to the second part of duodenum. The upper                            GI endoscopy was somewhat difficult due to                            excessive bleeding and presence of food. Successful                            completion of the procedure was aided by                            controlling the bleeding. The patient tolerated the                            procedure well. Scope In: Scope Out: Findings:      The examined esophagus was normal.      Large amount of blood and clots was noted in the gastric fundus.      Four gastric ulcers with one of the ulcers with a visible vessel       actively spurting blood was found in the prepyloric region of the       stomach. The largest lesion was 9 mm in largest dimension. Ulcer with       visible vessel was successfully injected with 1 mL of a 1:10,000       solution of epinephrine for drug delivery. Bicap (bipolar probe) was       used for hemostasis successfully and subsequently three hemostatic clips       were successfully placed. There was no bleeding at the end of the       procedure.  The first portion of the duodenum and second portion of the duodenum       were normal. Impression:               - Forrest classification 1a gastric ulcer with                            vissible vessel which was actively spurtting blood                            (90% risk for recurrent bleed)                           - Spurting gastric ulcers with a visible vessel.                            Injected. Clips were placed. Treated with bipolar                            cautery.                           - Large amount of blood and clots was noted in the                            gastric  fundus, couldnt clear the fundus.                           - Normal first portion of the duodenum and second                            portion of the duodenum.                           - No specimens collected. Moderate Sedation:      Moderate (conscious) sedation was personally administered by the       endoscopist and endoscopy nurse. The following parameters were       monitored: oxygen saturation, heart rate, blood pressure, respiratory       rate, EKG, adequacy of pulmonary ventilation, and response to care.       Total physician intraservice time was 55 minutes. Recommendation:           - NPO today. Ok to start ice chips and sips of                            water tomorrow if no further evidence of bleeding                           - Continue PPI drip                           -Check H.pylori and treat if positive                           - Repeat upper endoscopy in 1 month for  surveillance and document ulcer healing. Procedure Code(s):        --- Professional ---                           [phone], Esophagogastroduodenoscopy, flexible,                            transoral; with control of bleeding, any method Diagnosis Code(s):        --- Professional ---                           K92.2, Gastrointestinal hemorrhage, unspecified                           K25.4, Chronic or unspecified gastric ulcer with                            hemorrhage                           K92.1, Melena (includes Hematochezia) CPT copyright 2016 [employer]. All rights reserved. The codes documented in this report are preliminary and upon coder review may  be revised to meet current compliance requirements. Mauri Pole, MD 02/21/2016 2:21:28 PM This report has been signed electronically. Number of Addenda: 0

## 2016-02-21 NOTE — ED Notes (Signed)
Dr. Silverio Decamp GI paged at this time to inform of room available on [facility].

## 2016-02-21 NOTE — ED Notes (Signed)
Attempted report 

## 2016-02-21 NOTE — Progress Notes (Signed)
[facility] Progress Note Patient Name: Kimberly Bruce DOB: [birth year] MRN: [id number]   Date of Service  02/21/2016  HPI/Events of Note  Contacted by hospitalist regarding anemia and melena on admission with syncopal episode. Hospitalist reports patient now consenting to blood product transfusion. GI has been consulted.  Protonix started.  eICU Interventions  1. Intensivist to assess patient 2. Continue with transfusion of 3 units packed red blood cells 3. Continue with Protonix 4. Plan for ICU admission 5. Ordering second peripheral IV for access     Intervention Category Major Interventions: Hemorrhage - evaluation and management  Tera Partridge 02/21/2016, 6:48 AM

## 2016-02-21 NOTE — Progress Notes (Signed)
Pharmacy Antibiotic Note  Mac Darrin is a 45 y.o. female admitted on 02/20/2016 with GIB.  Pharmacy has been consulted for Ceftriaxone dosing for UTI.   Plan: -Ceftriaxone 1g IV q24h -F/U urine culture for directed therapy   Temp (24hrs), Avg:98.5 F (36.9 C), Min:98.5 F (36.9 C), Max:98.5 F (36.9 C)   Recent Labs Lab 02/21/16 0016 02/21/16 0045  WBC 8.9  --   CREATININE  --  0.70    CrCl cannot be calculated (Unknown ideal weight.).    Allergies  Allergen Reactions  . Citalopram Nausea And Vomiting     Narda Bonds 02/21/2016 5:31 AM

## 2016-02-21 NOTE — ED Notes (Signed)
Pt refused blood transfusion. Encouraged her to share her concerns about the process, but she chose not to elaborate beyond "I just don't think I could do it. It's so contrary to my upbringing."

## 2016-02-22 ENCOUNTER — Encounter (HOSPITAL_COMMUNITY): Payer: Self-pay | Admitting: Gastroenterology

## 2016-02-22 DIAGNOSIS — K254 Chronic or unspecified gastric ulcer with hemorrhage: Secondary | ICD-10-CM

## 2016-02-22 LAB — CBC
HCT: 28.3 % — ABNORMAL LOW (ref 36.0–46.0)
HCT: 24.3 % — ABNORMAL LOW (ref 36.0–46.0)
HCT: 22.6 % — ABNORMAL LOW (ref 36.0–46.0)
HEMATOCRIT: 22.8 % — AB (ref 36.0–46.0)
HEMATOCRIT: 21.9 % — AB (ref 36.0–46.0)
HEMOGLOBIN: 7.8 g/dL — AB (ref 12.0–15.0)
HEMOGLOBIN: 9.8 g/dL — AB (ref 12.0–15.0)
HEMOGLOBIN: 7.6 g/dL — AB (ref 12.0–15.0)
Hemoglobin: 8.4 g/dL — ABNORMAL LOW (ref 12.0–15.0)
Hemoglobin: 7.9 g/dL — ABNORMAL LOW (ref 12.0–15.0)
MCH: 29.8 pg (ref 26.0–34.0)
MCH: 29.8 pg (ref 26.0–34.0)
MCH: 30.1 pg (ref 26.0–34.0)
MCH: 29.8 pg (ref 26.0–34.0)
MCH: 30.7 pg (ref 26.0–34.0)
MCHC: 34.5 g/dL (ref 30.0–36.0)
MCHC: 34.6 g/dL (ref 30.0–36.0)
MCHC: 34.6 g/dL (ref 30.0–36.0)
MCHC: 34.6 g/dL (ref 30.0–36.0)
MCHC: 34.7 g/dL (ref 30.0–36.0)
MCV: 85.9 fL (ref 78.0–100.0)
MCV: 86 fL (ref 78.0–100.0)
MCV: 86.2 fL (ref 78.0–100.0)
MCV: 87.3 fL (ref 78.0–100.0)
MCV: 88.7 fL (ref 78.0–100.0)
PLATELETS: 173 10*3/uL (ref 150–400)
PLATELETS: 150 10*3/uL (ref 150–400)
PLATELETS: 193 10*3/uL (ref 150–400)
PLATELETS: 143 10*3/uL — AB (ref 150–400)
Platelets: 178 10*3/uL (ref 150–400)
RBC: 2.55 MIL/uL — ABNORMAL LOW (ref 3.87–5.11)
RBC: 2.57 MIL/uL — ABNORMAL LOW (ref 3.87–5.11)
RBC: 2.59 MIL/uL — AB (ref 3.87–5.11)
RBC: 2.82 MIL/uL — AB (ref 3.87–5.11)
RBC: 3.29 MIL/uL — ABNORMAL LOW (ref 3.87–5.11)
RDW: 14.9 % (ref 11.5–15.5)
RDW: 14.1 % (ref 11.5–15.5)
RDW: 15.1 % (ref 11.5–15.5)
RDW: 14.7 % (ref 11.5–15.5)
RDW: 14.8 % (ref 11.5–15.5)
WBC: 12 10*3/uL — AB (ref 4.0–10.5)
WBC: 12.3 10*3/uL — ABNORMAL HIGH (ref 4.0–10.5)
WBC: 12.3 10*3/uL — ABNORMAL HIGH (ref 4.0–10.5)
WBC: 13.5 10*3/uL — AB (ref 4.0–10.5)
WBC: 14.8 10*3/uL — ABNORMAL HIGH (ref 4.0–10.5)

## 2016-02-22 LAB — URINE CULTURE

## 2016-02-22 LAB — H. PYLORI ANTIBODY, IGG: H PYLORI IGG: 1.6 U/mL — AB (ref 0.0–0.8)

## 2016-02-22 LAB — GLUCOSE, CAPILLARY
GLUCOSE-CAPILLARY: 78 mg/dL (ref 65–99)
GLUCOSE-CAPILLARY: 86 mg/dL (ref 65–99)

## 2016-02-22 LAB — PREPARE RBC (CROSSMATCH)

## 2016-02-22 MED ORDER — SALINE SPRAY 0.65 % NA SOLN
1.0000 | NASAL | Status: DC | PRN
  Administered 2016-02-23: 1 via NASAL
  Filled 2016-02-22 (×2): qty 44

## 2016-02-22 MED ORDER — ZOLPIDEM TARTRATE 5 MG PO TABS
5.0000 mg | ORAL_TABLET | Freq: Once | ORAL | Status: AC
  Administered 2016-02-22: 5 mg via ORAL
  Filled 2016-02-22: qty 1

## 2016-02-22 MED ORDER — SODIUM CHLORIDE 0.9 % IV SOLN: Freq: Once | INTRAVENOUS | Status: DC

## 2016-02-22 MED ORDER — LORATADINE 10 MG PO TABS
10.0000 mg | ORAL_TABLET | Freq: Once | ORAL | Status: AC
  Administered 2016-02-22: 10 mg via ORAL
  Filled 2016-02-22: qty 1

## 2016-02-22 MED ORDER — SODIUM CHLORIDE 0.9 % IV BOLUS (SEPSIS)
1000.0000 mL | Freq: Once | INTRAVENOUS | Status: AC
  Administered 2016-02-22: 1000 mL via INTRAVENOUS

## 2016-02-22 NOTE — Progress Notes (Signed)
PROGRESS NOTE                                                                                                                                                                                                             Patient Demographics:    Kimberly Bruce, is a 45 y.o. female, DOB - 07-08-1971, [id number]  Admit date - 02/20/2016   Admitting Physician Rise Patience, MD  Outpatient Primary MD for the patient is Angelica Chessman, MD  LOS - 1  Outpatient Specialists: None  Chief Complaint  Patient presents with  . Loss of Consciousness       Brief Narrative   45 year old female with history of endometriosis and irregular menstrual cycle presented to the ED with 2 episodes of syncope on the evening prior to admission .Marland Kitchen She had one episode of bloody bowel movement with black tarry stool. Hemoglobin was around 7. (Was 11.9 in June 2016). She was tachycardic and mildly hypotensive.received  3 L IV normal saline bolus . Subsequent hemoglobin dropped to 4.5.  Ordered for 3 unit PRBC. PC CM and GI consulted. Patient taken for endoscopy this afternoon and showed 4 gastric ulcers (one with visible vessel actively spurting blood in the prepyloric region of the stomach (. Large amount of blood and clots seen on the gastric fundus. Hemostasis obtained and transferred back to stepdown unit.    Subjective:   Seen and examined. Complains of being lightheaded and had 2 episodes of watery stool mixed with blood.    Assessment  & Plan :    Principal Problem:   Acute Upper GI bleed Secondary to bleeding gastric ulcers. EGD done with hemostasis obtained. Continue PPI drip. Hemoglobin improved to 8.4 after 3 units PRBC transfused. Has had 2 episode of watery bowel movement mixed with blood which is possibly some retained blood. Monitor H&H every 8 hours for now. Will start on clear liquid. Follow-up with biopsy.  Active  Problems: Acute blood loss anemia Secondary to GI bleed. Hemoglobin improved appropriately with transfusion. Monitor closely for now. Patient lightheadedness morning after going to the bathroom. Ordered 1 L normal saline bolus. Transfuse as needed.  Endometriosis Reports heavy menses back-to-back in the past month  Syncope Secondary to GI bleed.  Bacteriuria with? UTI On empiric Rocephin. Follow cultures.  Pregnancy screen positive ED physician discussed with on-call  OB doctor Glo Herring who recommended serial beta-hCGs since abdominal sonogram will not be positive for low levels of beta-hCGs. I have instructed her to follow-up with her OB as outpatient  Hypokalemia Replenished  Code Status : Full code  Family Communication  : None at bedside  Disposition Plan  : Home possibly in the next 48 hours if no further symptoms and H&H stable.  Barriers For Discharge :  Active issues  Consults  :   PC CM [facility]  Procedures  : EGD  DVT Prophylaxis  : SCDs   Lab Results  Component Value Date   PLT 173 02/22/2016    Antibiotics  :   Anti-infectives    Start     Dose/Rate Route Frequency Ordered Stop   02/22/16 0600  cefTRIAXone (ROCEPHIN) 1 g in dextrose 5 % 50 mL IVPB     1 g 100 mL/hr over 30 Minutes Intravenous Every 24 hours 02/21/16 0531     02/21/16 0345  cefTRIAXone (ROCEPHIN) 1 g in dextrose 5 % 50 mL IVPB     1 g 100 mL/hr over 30 Minutes Intravenous  Once 02/21/16 0336 02/21/16 0502        Objective:   Filed Vitals:   02/22/16 0346 02/22/16 0746 02/22/16 0835 02/22/16 1114  BP: 96/53 115/62 108/62   Pulse: 105 101 104   Temp: 98.9 F (37.2 C) 98.7 F (37.1 C)  98.7 F (37.1 C)  TempSrc: Oral Oral  Oral  Resp: 18 18 18    Height:      Weight: 62.37 kg (137 lb 8 oz)     SpO2: 100% 100% 100%     Wt Readings from Last 3 Encounters:  02/22/16 62.37 kg (137 lb 8 oz)  04/22/15 63.504 kg (140 lb)  04/16/14 57.607 kg (127 lb)     Intake/Output  Summary (Last 24 hours) at 02/22/16 1140 Last data filed at 02/21/16 2048  Gross per 24 hour  Intake   3065 ml  Output      0 ml  Net   3065 ml     Physical Exam  Gen: Middle aged female not in distress HEENT: Pallor present , dry mucosa supple neck Chest: clear b/l, no added sounds CVS: S1 and S2 tachycardic, no murmurs,  GI: soft, NT, ND, BS+ Musculoskeletal: warm, no edema     Data Review:    CBC  Recent Labs Lab 02/21/16 0016  02/21/16 0537 02/21/16 0854 02/21/16 1658 02/22/16 0023 02/22/16 0748  WBC 8.9  --  9.0 8.6 14.8* 12.3* 12.3*  HGB 7.4*  < > 4.5* 5.0* 9.8* 7.6* 8.4*  HCT 22.2*  < > 13.9* 15.2* 28.3* 21.9* 24.3*  PLT 384  --  248 239 150 143* 173  MCV 96.5  --  95.9 95.6 86.0 85.9 86.2  MCH 32.2  --  31.0 31.4 29.8 29.8 29.8  MCHC 33.3  --  32.4 32.9 34.6 34.7 34.6  RDW 12.5  --  12.7 13.1 14.1 14.7 14.9  LYMPHSABS 1.8  --   --   --   --   --   --   MONOABS 0.3  --   --   --   --   --   --   EOSABS 0.0  --   --   --   --   --   --   BASOSABS 0.0  --   --   --   --   --   --   < > =  values in this interval not displayed.  Chemistries   Recent Labs Lab 02/21/16 0016 02/21/16 0045 02/21/16 0537  NA  --  141 142  K  --  3.4* 3.7  CL  --  102 115*  CO2  --   --  21*  GLUCOSE  --  106* 97  BUN  --  30* 23*  CREATININE  --  0.70 0.61  CALCIUM  --   --  7.3*  AST 19  --  13*  ALT 7*  --  6*  ALKPHOS 39  --  28*  BILITOT 0.2*  --  0.3   ------------------------------------------------------------------------------------------------------------------ No results for input(s): CHOL, HDL, LDLCALC, TRIG, CHOLHDL, LDLDIRECT in the last 72 hours.  No results found for: HGBA1C ------------------------------------------------------------------------------------------------------------------ No results for input(s): TSH, T4TOTAL, T3FREE, THYROIDAB in the last 72 hours.  Invalid input(s):  FREET3 ------------------------------------------------------------------------------------------------------------------ No results for input(s): VITAMINB12, FOLATE, FERRITIN, TIBC, IRON, RETICCTPCT in the last 72 hours.  Coagulation profile  Recent Labs Lab 02/21/16 0537  INR 1.32    No results for input(s): DDIMER in the last 72 hours.  Cardiac Enzymes No results for input(s): CKMB, TROPONINI, MYOGLOBIN in the last 168 hours.  Invalid input(s): CK ------------------------------------------------------------------------------------------------------------------ No results found for: BNP  Inpatient Medications  Scheduled Meds: . sodium chloride   Intravenous Once  . cefTRIAXone (ROCEPHIN)  IV  1 g Intravenous Q24H   Continuous Infusions: . pantoprozole (PROTONIX) infusion 8 mg/hr (02/22/16 1005)   PRN Meds:.acetaminophen **OR** acetaminophen  Micro Results Recent Results (from the past 240 hour(s))  MRSA PCR Screening     Status: None   Collection Time: 02/21/16  2:30 PM  Result Value Ref Range Status   MRSA by PCR NEGATIVE NEGATIVE Final    Comment:        The GeneXpert MRSA Assay (FDA approved for NASAL specimens only), is one component of a comprehensive MRSA colonization surveillance program. It is not intended to diagnose MRSA infection nor to guide or monitor treatment for MRSA infections.     Radiology Reports US Ob Comp Less 14 Wks  02/21/2016  CLINICAL DATA:  Syncope, pelvic pain. Beta HCG 5. Status post LEFT oophorectomy. EXAM: OBSTETRIC <14 WK [place] AND TRANSVAGINAL OB US TECHNIQUE: Both transabdominal and transvaginal ultrasound examinations were performed for complete evaluation of the gestation as well as the maternal uterus, adnexal regions, and pelvic cul-de-sac. Transvaginal technique was performed to assess early pregnancy. COMPARISON:  None. FINDINGS: Intrauterine gestational sac: Not present Yolk sac:  Not present Embryo:  Not present Cardiac  Activity: Not present Subchorionic hemorrhage:  None visualized. Maternal uterus/adnexae: 0.8 x 1 x 0.9 cm hypoechoic intramural uterine fundal leiomyoma. RIGHT ovary is 4.3 x 2.4 x 2.5 cm, status post LEFT oophorectomy. No free fluid. IMPRESSION: No sonographic findings of intrauterine pregnancy. Please note, pregnancy not typically sonographically evident at beta HCG less than 1,000. 1 cm uterine fundal intramural leiomyoma. Status post LEFT oophorectomy. Electronically Signed   By: Elon Alas M.D.   On: 02/21/2016 03:38   US Ob Transvaginal  02/21/2016  CLINICAL DATA:  Syncope, pelvic pain. Beta HCG 5. Status post LEFT oophorectomy. EXAM: OBSTETRIC <14 WK [place] AND TRANSVAGINAL OB US TECHNIQUE: Both transabdominal and transvaginal ultrasound examinations were performed for complete evaluation of the gestation as well as the maternal uterus, adnexal regions, and pelvic cul-de-sac. Transvaginal technique was performed to assess early pregnancy. COMPARISON:  None. FINDINGS: Intrauterine gestational sac: Not present Yolk sac:  Not present Embryo:  Not present Cardiac Activity: Not present Subchorionic hemorrhage:  None visualized. Maternal uterus/adnexae: 0.8 x 1 x 0.9 cm hypoechoic intramural uterine fundal leiomyoma. RIGHT ovary is 4.3 x 2.4 x 2.5 cm, status post LEFT oophorectomy. No free fluid. IMPRESSION: No sonographic findings of intrauterine pregnancy. Please note, pregnancy not typically sonographically evident at beta HCG less than 1,000. 1 cm uterine fundal intramural leiomyoma. Status post LEFT oophorectomy. Electronically Signed   By: Elon Alas M.D.   On: 02/21/2016 03:38    Time Spent in minutes  25   Louellen Molder M.D on 02/22/2016 at 11:40 AM  Between 7am to 7pm - Pager - [phone]  After 7pm go to [email or web address] - password [facility]  [facility] -  Office  [phone]

## 2016-02-22 NOTE — Progress Notes (Signed)
[facility] ROUNDING NOTE   Subjective: Hemoglobin remains stable, reports having bowel movements with dark blood   Objective: Vital signs in last 24 hours: Temp:  [98.7 F (37.1 C)-98.9 F (37.2 C)] 98.7 F (37.1 C) (05/02 1114) Pulse Rate:  [101-106] 102 (05/02 1109) Resp:  [11-22] 22 (05/02 1109) BP: (93-115)/(51-62) 103/56 mmHg (05/02 1109) SpO2:  [100 %] 100 % (05/02 1109) Weight:  [137 lb 8 oz (62.37 kg)] 137 lb 8 oz (62.37 kg) (05/02 0346)   General: NAD Heart: RRR Chest: clear bil.  Abdomen: soft, NTND. BS +  Extremities: no CCE Neuro/Psych: Pleasant, alert    Intake/Output from previous day: 05/01 0701 - 05/02 0700 In: 3400 [I.V.:2730; Blood:670] Out: -  Intake/Output this shift:     Lab Results:  Recent Labs  02/22/16 0023 02/22/16 0748 02/22/16 1349  WBC 12.3* 12.3* 13.5*  HGB 7.6* 8.4* 7.9*  PLT 143* 173 178  MCV 85.9 86.2 88.7   BMET  Recent Labs  02/21/16 0045 02/21/16 0537  NA 141 142  K 3.4* 3.7  CL 102 115*  CO2  --  21*  GLUCOSE 106* 97  BUN 30* 23*  CREATININE 0.70 0.61  CALCIUM  --  7.3*   LFT  Recent Labs  02/21/16 0016 02/21/16 0537  PROT 6.2* 4.4*  ALBUMIN 3.2* 2.3*  AST 19 13*  ALT 7* 6*  ALKPHOS 39 28*  BILITOT 0.2* 0.3  BILIDIR <0.1*  --   IBILI NOT CALCULATED  --    PT/INR  Recent Labs  02/21/16 0537  INR 1.32      Imaging/Other results: US Ob Comp Less 14 Wks  02/21/2016  CLINICAL DATA:  Syncope, pelvic pain. Beta HCG 5. Status post LEFT oophorectomy. EXAM: OBSTETRIC <14 WK [place] AND TRANSVAGINAL OB US TECHNIQUE: Both transabdominal and transvaginal ultrasound examinations were performed for complete evaluation of the gestation as well as the maternal uterus, adnexal regions, and pelvic cul-de-sac. Transvaginal technique was performed to assess early pregnancy. COMPARISON:  None. FINDINGS: Intrauterine gestational sac: Not present Yolk sac:  Not present Embryo:  Not present Cardiac  Activity: Not present Subchorionic hemorrhage:  None visualized. Maternal uterus/adnexae: 0.8 x 1 x 0.9 cm hypoechoic intramural uterine fundal leiomyoma. RIGHT ovary is 4.3 x 2.4 x 2.5 cm, status post LEFT oophorectomy. No free fluid. IMPRESSION: No sonographic findings of intrauterine pregnancy. Please note, pregnancy not typically sonographically evident at beta HCG less than 1,000. 1 cm uterine fundal intramural leiomyoma. Status post LEFT oophorectomy. Electronically Signed   By: Elon Alas M.D.   On: 02/21/2016 03:38   US Ob Transvaginal  02/21/2016  CLINICAL DATA:  Syncope, pelvic pain. Beta HCG 5. Status post LEFT oophorectomy. EXAM: OBSTETRIC <14 WK [place] AND TRANSVAGINAL OB US TECHNIQUE: Both transabdominal and transvaginal ultrasound examinations were performed for complete evaluation of the gestation as well as the maternal uterus, adnexal regions, and pelvic cul-de-sac. Transvaginal technique was performed to assess early pregnancy. COMPARISON:  None. FINDINGS: Intrauterine gestational sac: Not present Yolk sac:  Not present Embryo:  Not present Cardiac Activity: Not present Subchorionic hemorrhage:  None visualized. Maternal uterus/adnexae: 0.8 x 1 x 0.9 cm hypoechoic intramural uterine fundal leiomyoma. RIGHT ovary is 4.3 x 2.4 x 2.5 cm, status post LEFT oophorectomy. No free fluid. IMPRESSION: No sonographic findings of intrauterine pregnancy. Please note, pregnancy not typically sonographically evident at beta HCG less than 1,000. 1 cm uterine fundal intramural leiomyoma. Status post LEFT oophorectomy. Electronically Signed   By: Sandie Ano  Bloomer M.D.   On: 02/21/2016 03:38      Assessment & Plan   45 year old female with history of endometriosis presented with symptomatic anemia days status post EGD with evidence of gastric antral ulcers with active bleeding status post endoscopic therapy with hemostatic clips bipolar cautery and epinephrine with stable hemoglobin. She continues to  have a melena, likely passing old blood Continue to monitor hemoglobin every 12 hours Continue PPI drip for 72 hours and then switch to twice daily H pylori IgG elevated, will need to be treated for H. pylori on discharge (pylera 10 days course) Repeat EGD in 4-6 weeks Avoid NSAID's      Raliegh Ip Denzil Magnuson , MD [phone] Mon-Fri 8a-5p [phone] after 5p, weekends, holidays [facility]

## 2016-02-23 DIAGNOSIS — K253 Acute gastric ulcer without hemorrhage or perforation: Secondary | ICD-10-CM | POA: Diagnosis present

## 2016-02-23 DIAGNOSIS — R760 Raised antibody titer: Secondary | ICD-10-CM

## 2016-02-23 LAB — CBC
HCT: 21.5 % — ABNORMAL LOW (ref 36.0–46.0)
HEMOGLOBIN: 7.3 g/dL — AB (ref 12.0–15.0)
MCH: 30.3 pg (ref 26.0–34.0)
MCHC: 34 g/dL (ref 30.0–36.0)
MCV: 89.2 fL (ref 78.0–100.0)
Platelets: 195 10*3/uL (ref 150–400)
RBC: 2.41 MIL/uL — AB (ref 3.87–5.11)
RDW: 14.8 % (ref 11.5–15.5)
WBC: 8.7 10*3/uL (ref 4.0–10.5)

## 2016-02-23 LAB — GLUCOSE, CAPILLARY
Glucose-Capillary: 109 mg/dL — ABNORMAL HIGH (ref 65–99)
Glucose-Capillary: 88 mg/dL (ref 65–99)

## 2016-02-23 LAB — HCG, QUANTITATIVE, PREGNANCY: hCG, Beta Chain, Quant, S: 6 m[IU]/mL — ABNORMAL HIGH (ref <5)

## 2016-02-23 MED ORDER — ZOLPIDEM TARTRATE 5 MG PO TABS
5.0000 mg | ORAL_TABLET | Freq: Every evening | ORAL | Status: DC | PRN
  Administered 2016-02-23: 5 mg via ORAL
  Filled 2016-02-23: qty 1

## 2016-02-23 NOTE — Progress Notes (Signed)
Daily Rounding Note  02/23/2016, 9:14 AM  LOS: 2 days   SUBJECTIVE:       MD advanced pt to regular diet this AM.  .  Stools smaller in volume.  Last night stool actually brown/tan but some dark, soft, unformed BM this morning which did not leach any red blood into commode.  Feels great.  No complaints. Walking and no dizziness or weakness.   OBJECTIVE:         Vital signs in last 24 hours:    Temp:  [98.1 F (36.7 C)-99.2 F (37.3 C)] 98.7 F (37.1 C) (05/03 0700) Pulse Rate:  [93-102] 93 (05/03 0700) Resp:  [18-22] 18 (05/03 0500) BP: (97-106)/(53-58) 103/55 mmHg (05/03 0700) SpO2:  [99 %-100 %] 100 % (05/03 0700) Weight:  [58.469 kg (128 lb 14.4 oz)] 58.469 kg (128 lb 14.4 oz) (05/03 0500) Last BM Date: 02/23/16 Filed Weights   02/21/16 1258 02/22/16 0346 02/23/16 0500  Weight: 57.153 kg (126 lb) 62.37 kg (137 lb 8 oz) 58.469 kg (128 lb 14.4 oz)   General: pleasant.  Looks well.    Heart: RRR Chest: clear bil.  No dyspnea Abdomen: soft, NT, ND.  Active BS  Extremities: no CCE.  Feet warm, well-perfused Neuro/Psych:  Pleasant, calm.  Fully alert, oriented and appropriate.   Intake/Output from previous day: 05/02 0701 - 05/03 0700 In: 783.6 [I.V.:733.6; IV Piggyback:50] Out: -   Intake/Output this shift:    Lab Results:  Recent Labs  02/22/16 1349 02/22/16 2155 02/23/16 0612  WBC 13.5* 12.0* 8.7  HGB 7.9* 7.8* 7.3*  HCT 22.8* 22.6* 21.5*  PLT 178 193 195   BMET  Recent Labs  02/21/16 0045 02/21/16 0537  NA 141 142  K 3.4* 3.7  CL 102 115*  CO2  --  21*  GLUCOSE 106* 97  BUN 30* 23*  CREATININE 0.70 0.61  CALCIUM  --  7.3*   LFT  Recent Labs  02/21/16 0016 02/21/16 0537  PROT 6.2* 4.4*  ALBUMIN 3.2* 2.3*  AST 19 13*  ALT 7* 6*  ALKPHOS 39 28*  BILITOT 0.2* 0.3  BILIDIR <0.1*  --   IBILI NOT CALCULATED  --    PT/INR  Recent Labs  02/21/16 0537  LABPROT 16.5*  INR 1.32    Hepatitis Panel No results for input(s): HEPBSAG, HCVAB, HEPAIGM, HEPBIGM in the last 72 hours.  Studies/Results: No results found.   Scheduled Meds: . sodium chloride   Intravenous Once  . cefTRIAXone (ROCEPHIN)  IV  1 g Intravenous Q24H   Continuous Infusions: . pantoprozole (PROTONIX) infusion 8 mg/hr (02/23/16 0528)   PRN Meds:.acetaminophen **OR** acetaminophen, sodium chloride   ASSESMENT:   *  UGI bleed. 5/1 EGD with bleeding antral ulcers.  Treated with epi, bicap, endoclip Elevated H Pylori Ab, stool H pylori not yet collected.   *  Blood loss anemia. Hgb relatively stable though drifting slowly down.   Hx of anemia since her teens, but baseline Hgb 11 months ago was 11.9.   *  ?UTI.  On Rocephin. Urine clx grew multiple species, suggest recollection. No dysuria, hematuria, frequency etc.   PLAN   *  Protonix gtt finishes at 2 AM tomorrow.   *  Start her on triple therapy for H pylori at discharge.  *  Observe another 24 hours, hopefully Hgb will notdrop further and she can discharge tomorrow.   *   Does she need the  Rocephin?     Azucena Freed  02/23/2016, 9:14 AM Pager: [phone]   Attending physician's note   I have taken an interval history, reviewed the chart and examined the patient. I agree with the Advanced Practitioner's note, impression and recommendations. Hgb slightly trending down, could be equilibrating.  Continue Protonix gtt for 72 hours and transition to BID after. Patient is tolerating PO well. May need 1U PRBC prior to discharge.   Damaris Hippo, MD [phone] Mon-Fri 8a-5p [phone] after 5p, weekends, holidays

## 2016-02-23 NOTE — Progress Notes (Signed)
PROGRESS NOTE                                                                                                                                                                                                             Patient Demographics:    Kimberly Bruce, is a 45 y.o. female, DOB - 1971/09/15, [id number]  Admit date - 02/20/2016   Admitting Physician Rise Patience, MD  Outpatient Primary MD for the patient is Angelica Chessman, MD  LOS - 2  Outpatient Specialists: None  Chief Complaint  Patient presents with  . Loss of Consciousness       Brief Narrative   45 year old female with history of endometriosis and irregular menstrual cycle presented to the ED with 2 episodes of syncope on the evening prior to admission .Marland Kitchen She had one episode of bloody bowel movement with black tarry stool. Hemoglobin was around 7. (Was 11.9 in June 2016). She was tachycardic and mildly hypotensive.received  3 L IV normal saline bolus . Subsequent hemoglobin dropped to 4.5.  Ordered for 3 unit PRBC. PC CM and GI consulted. Patient taken for endoscopy this afternoon and showed 4 gastric ulcers (one with visible vessel actively spurting blood in the prepyloric region of the stomach (. Large amount of blood and clots seen on the gastric fundus. Hemostasis obtained and transferred back to stepdown unit.    Subjective:   Had a small amount of dark stool this morning. Denies further lightheadedness.    Assessment  & Plan :    Principal Problem:   Acute Upper GI bleed Secondary to bleeding gastric ulcers. EGD done with hemostasis obtained. Continue PPI drip. Hemoglobin improved to 8.4 after 3 units PRBC transfused. Having occasional dark stool which is possibly residual blood. Slight drop in H&H noted but does not need further transfusion. Advance diet to regular. Continue PPI drip. Transition to IV PPI twice a day tomorrow morning.  Monitor H&H every 12 hours. H. pylori antigen positive. Will prescribe  10 day course of pylera upon discharge.  Active Problems: Acute blood loss anemia Secondary to GI bleed. Hemoglobin improved  with transfusion. Slight drop today.    Endometriosis Reports heavy menses back-to-back in the past month  Syncope Secondary to GI bleed.  Bacteriuria with? UTI Received 3 days of Rocephin already. He symptomatic. Culture growing mixed bacteria.  Discontinue  Pregnancy screen positive ED physician discussed with on-call OB doctor Glo Herring who recommended serial beta-hCGs since abdominal sonogram will not be positive for low levels of beta-hCGs. I have instructed her to follow-up with her OB as outpatient  Hypokalemia Replenished  Code Status : Full code  Family Communication  : None at bedside  Disposition Plan  : Home possibly tomorrow if stable.  Barriers For Discharge :  Active issues  Consults  :   PC [facility]  Procedures  : EGD  DVT Prophylaxis  : SCDs   Lab Results  Component Value Date   PLT 195 02/23/2016    Antibiotics  :   Anti-infectives    Start     Dose/Rate Route Frequency Ordered Stop   02/22/16 0600  cefTRIAXone (ROCEPHIN) 1 g in dextrose 5 % 50 mL IVPB     1 g 100 mL/hr over 30 Minutes Intravenous Every 24 hours 02/21/16 0531     02/21/16 0345  cefTRIAXone (ROCEPHIN) 1 g in dextrose 5 % 50 mL IVPB     1 g 100 mL/hr over 30 Minutes Intravenous  Once 02/21/16 0336 02/21/16 0502        Objective:   Filed Vitals:   02/22/16 1950 02/22/16 2345 02/23/16 0500 02/23/16 0700  BP: 103/53 97/56 106/58 103/55  Pulse:   96 93  Temp: 98.6 F (37 C) 98.1 F (36.7 C) 98.5 F (36.9 C) 98.7 F (37.1 C)  TempSrc: Oral Oral Oral Oral  Resp:   18   Height:      Weight:   58.469 kg (128 lb 14.4 oz)   SpO2: 99% 100% 100% 100%    Wt Readings from Last 3 Encounters:  02/23/16 58.469 kg (128 lb 14.4 oz)  04/22/15 63.504 kg (140 lb)  04/16/14 57.607  kg (127 lb)     Intake/Output Summary (Last 24 hours) at 02/23/16 1348 Last data filed at 02/23/16 1100  Gross per 24 hour  Intake  793.6 ml  Output      0 ml  Net  793.6 ml     Physical Exam  Gen: Middle aged female not in distress HEENT: Pallor present , Moist mucosa,  supple neck Chest: clear b/l, no added sounds CVS: S1 and S2 tachycardic, no murmurs,  GI: soft, NT, ND, BS+ Musculoskeletal: warm, no edema     Data Review:    CBC  Recent Labs Lab 02/21/16 0016  02/22/16 0023 02/22/16 0748 02/22/16 1349 02/22/16 2155 02/23/16 0612  WBC 8.9  < > 12.3* 12.3* 13.5* 12.0* 8.7  HGB 7.4*  < > 7.6* 8.4* 7.9* 7.8* 7.3*  HCT 22.2*  < > 21.9* 24.3* 22.8* 22.6* 21.5*  PLT 384  < > 143* 173 178 193 195  MCV 96.5  < > 85.9 86.2 88.7 87.3 89.2  MCH 32.2  < > 29.8 29.8 30.7 30.1 30.3  MCHC 33.3  < > 34.7 34.6 34.6 34.5 34.0  RDW 12.5  < > 14.7 14.9 15.1 14.8 14.8  LYMPHSABS 1.8  --   --   --   --   --   --   MONOABS 0.3  --   --   --   --   --   --   EOSABS 0.0  --   --   --   --   --   --   BASOSABS 0.0  --   --   --   --   --   --   < > =  values in this interval not displayed.  Chemistries   Recent Labs Lab 02/21/16 0016 02/21/16 0045 02/21/16 0537  NA  --  141 142  K  --  3.4* 3.7  CL  --  102 115*  CO2  --   --  21*  GLUCOSE  --  106* 97  BUN  --  30* 23*  CREATININE  --  0.70 0.61  CALCIUM  --   --  7.3*  AST 19  --  13*  ALT 7*  --  6*  ALKPHOS 39  --  28*  BILITOT 0.2*  --  0.3   ------------------------------------------------------------------------------------------------------------------ No results for input(s): CHOL, HDL, LDLCALC, TRIG, CHOLHDL, LDLDIRECT in the last 72 hours.  No results found for: HGBA1C ------------------------------------------------------------------------------------------------------------------ No results for input(s): TSH, T4TOTAL, T3FREE, THYROIDAB in the last 72 hours.  Invalid input(s):  FREET3 ------------------------------------------------------------------------------------------------------------------ No results for input(s): VITAMINB12, FOLATE, FERRITIN, TIBC, IRON, RETICCTPCT in the last 72 hours.  Coagulation profile  Recent Labs Lab 02/21/16 0537  INR 1.32    No results for input(s): DDIMER in the last 72 hours.  Cardiac Enzymes No results for input(s): CKMB, TROPONINI, MYOGLOBIN in the last 168 hours.  Invalid input(s): CK ------------------------------------------------------------------------------------------------------------------ No results found for: BNP  Inpatient Medications  Scheduled Meds: . sodium chloride   Intravenous Once  . cefTRIAXone (ROCEPHIN)  IV  1 g Intravenous Q24H   Continuous Infusions: . pantoprozole (PROTONIX) infusion 8 mg/hr (02/23/16 0528)   PRN Meds:.acetaminophen **OR** acetaminophen, sodium chloride  Micro Results Recent Results (from the past 240 hour(s))  Culture, Urine     Status: Abnormal   Collection Time: 02/21/16  2:25 AM  Result Value Ref Range Status   Specimen Description URINE, CLEAN CATCH  Final   Special Requests ADDED [phone]  Final   Culture MULTIPLE SPECIES PRESENT, SUGGEST RECOLLECTION (A)  Final   Report Status 02/22/2016 FINAL  Final  MRSA PCR Screening     Status: None   Collection Time: 02/21/16  2:30 PM  Result Value Ref Range Status   MRSA by PCR NEGATIVE NEGATIVE Final    Comment:        The GeneXpert MRSA Assay (FDA approved for NASAL specimens only), is one component of a comprehensive MRSA colonization surveillance program. It is not intended to diagnose MRSA infection nor to guide or monitor treatment for MRSA infections.     Radiology Reports US Ob Comp Less 14 Wks  02/21/2016  CLINICAL DATA:  Syncope, pelvic pain. Beta HCG 5. Status post LEFT oophorectomy. EXAM: OBSTETRIC <14 WK [place] AND TRANSVAGINAL OB US TECHNIQUE: Both transabdominal and transvaginal ultrasound  examinations were performed for complete evaluation of the gestation as well as the maternal uterus, adnexal regions, and pelvic cul-de-sac. Transvaginal technique was performed to assess early pregnancy. COMPARISON:  None. FINDINGS: Intrauterine gestational sac: Not present Yolk sac:  Not present Embryo:  Not present Cardiac Activity: Not present Subchorionic hemorrhage:  None visualized. Maternal uterus/adnexae: 0.8 x 1 x 0.9 cm hypoechoic intramural uterine fundal leiomyoma. RIGHT ovary is 4.3 x 2.4 x 2.5 cm, status post LEFT oophorectomy. No free fluid. IMPRESSION: No sonographic findings of intrauterine pregnancy. Please note, pregnancy not typically sonographically evident at beta HCG less than 1,000. 1 cm uterine fundal intramural leiomyoma. Status post LEFT oophorectomy. Electronically Signed   By: Elon Alas M.D.   On: 02/21/2016 03:38   US Ob Transvaginal  02/21/2016  CLINICAL DATA:  Syncope, pelvic pain. Beta HCG 5. Status post  LEFT oophorectomy. EXAM: OBSTETRIC <14 WK [place] AND TRANSVAGINAL OB US TECHNIQUE: Both transabdominal and transvaginal ultrasound examinations were performed for complete evaluation of the gestation as well as the maternal uterus, adnexal regions, and pelvic cul-de-sac. Transvaginal technique was performed to assess early pregnancy. COMPARISON:  None. FINDINGS: Intrauterine gestational sac: Not present Yolk sac:  Not present Embryo:  Not present Cardiac Activity: Not present Subchorionic hemorrhage:  None visualized. Maternal uterus/adnexae: 0.8 x 1 x 0.9 cm hypoechoic intramural uterine fundal leiomyoma. RIGHT ovary is 4.3 x 2.4 x 2.5 cm, status post LEFT oophorectomy. No free fluid. IMPRESSION: No sonographic findings of intrauterine pregnancy. Please note, pregnancy not typically sonographically evident at beta HCG less than 1,000. 1 cm uterine fundal intramural leiomyoma. Status post LEFT oophorectomy. Electronically Signed   By: Elon Alas M.D.   On: 02/21/2016  03:38    Time Spent in minutes  25   Louellen Molder M.D on 02/23/2016 at 1:48 PM  Between 7am to 7pm - Pager - [phone]  After 7pm go to [email or web address] - password [facility]  [facility] -  Office  [phone]

## 2016-02-23 NOTE — Plan of Care (Signed)
Problem: Bowel/Gastric: Goal: Will show no signs and symptoms of gastrointestinal bleeding Outcome: Progressing Pt has had a few bowel movements with blood still however they are starting to clear up.

## 2016-02-24 DIAGNOSIS — N390 Urinary tract infection, site not specified: Secondary | ICD-10-CM | POA: Diagnosis present

## 2016-02-24 DIAGNOSIS — R768 Other specified abnormal immunological findings in serum: Secondary | ICD-10-CM | POA: Diagnosis present

## 2016-02-24 LAB — TYPE AND SCREEN
ABO/RH(D): O POS
ANTIBODY SCREEN: NEGATIVE
UNIT DIVISION: 0
UNIT DIVISION: 0
Unit division: 0
Unit division: 0
Unit division: 0
Unit division: 0
Unit division: 0

## 2016-02-24 LAB — HEMOGLOBIN AND HEMATOCRIT, BLOOD
HEMATOCRIT: 21.3 % — AB (ref 36.0–46.0)
Hemoglobin: 7 g/dL — ABNORMAL LOW (ref 12.0–15.0)

## 2016-02-24 LAB — PREPARE RBC (CROSSMATCH)

## 2016-02-24 MED ORDER — PANTOPRAZOLE SODIUM 40 MG PO TBEC: 40.0000 mg | DELAYED_RELEASE_TABLET | Freq: Two times a day (BID) | ORAL | Status: DC

## 2016-02-24 MED ORDER — ZOLPIDEM TARTRATE 5 MG PO TABS: 5.0000 mg | ORAL_TABLET | Freq: Every evening | ORAL | Status: DC | PRN

## 2016-02-24 MED ORDER — PANTOPRAZOLE SODIUM 40 MG PO TBEC
40.0000 mg | DELAYED_RELEASE_TABLET | Freq: Two times a day (BID) | ORAL | Status: DC
  Administered 2016-02-24: 40 mg via ORAL
  Filled 2016-02-24: qty 1

## 2016-02-24 MED ORDER — SODIUM CHLORIDE 0.9 % IV SOLN: Freq: Once | INTRAVENOUS | Status: DC

## 2016-02-24 MED ORDER — BIS SUBCIT-METRONID-TETRACYC 140-125-125 MG PO CAPS: 3.0000 | ORAL_CAPSULE | Freq: Three times a day (TID) | ORAL | Status: DC

## 2016-02-24 NOTE — Discharge Summary (Signed)
Physician Discharge Summary  Kimberly Bruce [id number] DOB: 05/15/1971 DOA: 02/20/2016  PCP: Angelica Chessman, MD  Admit date: 02/20/2016 Discharge date: 02/24/2016  Time spent: 35 minutes  Recommendations for Outpatient Follow-up:  1. Discharged home on 10 day course of Pylera  for H. pylori. (Stop date: After 03/04/2016) 2. Follow-up with PCP and GI ( Dr Silverio Decamp) in 1 week. Needs hemoglobin checked during follow-up.    Discharge Diagnoses:  Principal Problem:   Acute upper GI bleeding  Active Problems:   Acute gastric ulcer   Acute blood loss anemia   Pregnancy test positive   UTI (lower urinary tract infection)   Helicobacter pylori ab+   Discharge Condition: Fair  Diet recommendation: Regular   Filed Weights   02/22/16 0346 02/23/16 0500 02/24/16 0600  Weight: 62.37 kg (137 lb 8 oz) 58.469 kg (128 lb 14.4 oz) 57.199 kg (126 lb 1.6 oz)    History of present illness:  Please refer to admission H&P for details, in brief,45 year old female with history of endometriosis and irregular menstrual cycle presented to the ED with 2 episodes of syncope on the evening prior to admission .Marland Kitchen She had one episode of bloody bowel movement with black tarry stool. Hemoglobin was around 7. (Was 11.9 in June 2016). She was tachycardic and mildly hypotensive.received 3 L IV normal saline bolus . Subsequent hemoglobin dropped to 4.5. Ordered for 3 unit PRBC. PC CM and GI consulted. Patient taken for endoscopy this afternoon and showed 4 gastric ulcers (one with visible vessel actively spurting blood in the prepyloric region of the stomach (. Large amount of blood and clots seen on the gastric fundus. Hemostasis obtained and transferred back to stepdown unit.  Hospital Course:  Principal Problem:  Acute Upper GI bleed Secondary to bleeding gastric ulcers. EGD done with hemostasis obtained.  Hemoglobin improved to 8.4 after 3 units PRBC transfused. Had occasional dark stool subsequently  but appears to be old blood. Asymptomatic and tolerating advanced diet. Off PPI drip and transitioned to oral PPI twice a day with outpatient follow-up with GI. H. pylori antigen positive. Will prescribe 10 day course of pylera upon discharge. Patient will need repeat EGD in 4-6 weeks . -Discussed plan with GI and they agree with discharging patient on twice a day PPI and Pylera. She will follow-up with Dr Silverio Decamp sometime next week. -Patient strictly instructed to avoid NSAIDs.  Active Problems: Acute blood loss anemia Secondary to GI bleed. Hemoglobin improved with transfusion. Drop to 7 this morning. Patient asymptomatic and no further GI bleed. Planned for 1 unit PRBC transfusion and discharged home but patient's IV infiltrated and required another IV placement but patient refused to have a new IV and wanted to go home. She was discharged home without further transfusion.   Endometriosis Reports heavy menses back-to-back in the past month  Syncope Secondary to GI bleed.  Bacteriuria with? UTI Received 3 days of Rocephin already. He symptomatic. Culture growing mixed bacteria.   Pregnancy screen positive ED physician discussed with on-call OB doctor Glo Herring who recommended serial beta-hCGs since abdominal sonogram will not be positive for low levels of beta-hCGs. I have instructed her to follow-up with her OB as outpatient  Hypokalemia Replenished  Code Status : Full code  Family Communication : None at bedside  Disposition Plan : Home     Consults :  PC CM [facility]  Procedures : EGD  Discharge Exam: Filed Vitals:   02/23/16 2046 02/24/16 0600  BP: 112/52 101/55  Pulse: 88  88  Temp: 98.3 F (36.8 C) 98.5 F (36.9 C)  Resp: 18 16    Gen: Middle aged female not in distress HEENT: Pallor present , Moist mucosa, supple neck Chest: clear b/l, no added sounds CVS: S1 and S2 tachycardic, no murmurs,  GI: soft, NT, ND, BS+ Musculoskeletal: warm, no  edema CNS: Alert and oriented  Discharge Instructions    Current Discharge Medication List    START taking these medications   Details  bismuth-metronidazole-tetracycline (PYLERA) 140-125-125 MG capsule Take 3 capsules by mouth 4 (four) times daily -  before meals and at bedtime. Qty: 120 capsule, Refills: 0    pantoprazole (PROTONIX) 40 MG tablet Take 1 tablet (40 mg total) by mouth 2 (two) times daily. Qty: 60 tablet, Refills: 0      CONTINUE these medications which have NOT CHANGED   Details  HYDROcodone-acetaminophen (NORCO) 10-325 MG tablet Take 0.5-1 tablets by mouth every 8 (eight) hours as needed for moderate pain.     LORazepam (ATIVAN) 1 MG tablet Take 1 tablet (1 mg total) by mouth every 8 (eight) hours. Qty: 30 tablet, Refills: 0    zolpidem (AMBIEN) 10 MG tablet Take 1 tablet (10 mg total) by mouth at bedtime as needed for sleep. Qty: 30 tablet, Refills: 2      STOP taking these medications     doxycycline (VIBRAMYCIN) 100 MG capsule      doxycycline (VIBRAMYCIN) 100 MG capsule      metroNIDAZOLE (FLAGYL) 500 MG tablet      metroNIDAZOLE (FLAGYL) 500 MG tablet      naproxen (NAPROSYN) 500 MG tablet      oxyCODONE-acetaminophen (PERCOCET/ROXICET) 5-325 MG per tablet      oxyCODONE-acetaminophen (PERCOCET/ROXICET) 5-325 MG per tablet      sertraline (ZOLOFT) 50 MG tablet      traZODone (DESYREL) 50 MG tablet      Vitamin D, Ergocalciferol, (DRISDOL) 50000 UNITS CAPS capsule        Allergies  Allergen Reactions  . Citalopram Nausea And Vomiting   Follow-up Information    Follow up with Angelica Chessman, MD. Schedule an appointment as soon as possible for a visit in 1 week.   Specialty:  Internal Medicine   Contact information:   [place] [address] [phone]       Follow up with Harl Bowie, MD. Call in 1 week.   Specialty:  Gastroenterology   Contact information:   [place]  [id number] [phone]        The results of significant diagnostics from this hospitalization (including imaging, microbiology, ancillary and laboratory) are listed below for reference.    Significant Diagnostic Studies: US Ob Comp Less 14 Wks  08-Mar-2016  CLINICAL DATA:  Syncope, pelvic pain. Beta HCG 5. Status post LEFT oophorectomy. EXAM: OBSTETRIC <14 WK [place] AND TRANSVAGINAL OB US TECHNIQUE: Both transabdominal and transvaginal ultrasound examinations were performed for complete evaluation of the gestation as well as the maternal uterus, adnexal regions, and pelvic cul-de-sac. Transvaginal technique was performed to assess early pregnancy. COMPARISON:  None. FINDINGS: Intrauterine gestational sac: Not present Yolk sac:  Not present Embryo:  Not present Cardiac Activity: Not present Subchorionic hemorrhage:  None visualized. Maternal uterus/adnexae: 0.8 x 1 x 0.9 cm hypoechoic intramural uterine fundal leiomyoma. RIGHT ovary is 4.3 x 2.4 x 2.5 cm, status post LEFT oophorectomy. No free fluid. IMPRESSION: No sonographic findings of intrauterine pregnancy. Please note, pregnancy not typically sonographically evident  at beta HCG less than 1,000. 1 cm uterine fundal intramural leiomyoma. Status post LEFT oophorectomy. Electronically Signed   By: Elon Alas M.D.   On: 02/21/2016 03:38   US Ob Transvaginal  02/21/2016  CLINICAL DATA:  Syncope, pelvic pain. Beta HCG 5. Status post LEFT oophorectomy. EXAM: OBSTETRIC <14 WK [place] AND TRANSVAGINAL OB US TECHNIQUE: Both transabdominal and transvaginal ultrasound examinations were performed for complete evaluation of the gestation as well as the maternal uterus, adnexal regions, and pelvic cul-de-sac. Transvaginal technique was performed to assess early pregnancy. COMPARISON:  None. FINDINGS: Intrauterine gestational sac: Not present Yolk sac:  Not present Embryo:  Not present Cardiac Activity: Not present Subchorionic hemorrhage:  None visualized. Maternal  uterus/adnexae: 0.8 x 1 x 0.9 cm hypoechoic intramural uterine fundal leiomyoma. RIGHT ovary is 4.3 x 2.4 x 2.5 cm, status post LEFT oophorectomy. No free fluid. IMPRESSION: No sonographic findings of intrauterine pregnancy. Please note, pregnancy not typically sonographically evident at beta HCG less than 1,000. 1 cm uterine fundal intramural leiomyoma. Status post LEFT oophorectomy. Electronically Signed   By: Elon Alas M.D.   On: 02/21/2016 03:38    Microbiology: Recent Results (from the past 240 hour(s))  Culture, Urine     Status: Abnormal   Collection Time: 02/21/16  2:25 AM  Result Value Ref Range Status   Specimen Description URINE, CLEAN CATCH  Final   Special Requests ADDED 0607  Final   Culture MULTIPLE SPECIES PRESENT, SUGGEST RECOLLECTION (A)  Final   Report Status 02/22/2016 FINAL  Final  MRSA PCR Screening     Status: None   Collection Time: 02/21/16  2:30 PM  Result Value Ref Range Status   MRSA by PCR NEGATIVE NEGATIVE Final    Comment:        The GeneXpert MRSA Assay (FDA approved for NASAL specimens only), is one component of a comprehensive MRSA colonization surveillance program. It is not intended to diagnose MRSA infection nor to guide or monitor treatment for MRSA infections.      Labs: Basic Metabolic Panel:  Recent Labs Lab 02/21/16 0045 02/21/16 0537  NA 141 142  K 3.4* 3.7  CL 102 115*  CO2  --  21*  GLUCOSE 106* 97  BUN 30* 23*  CREATININE 0.70 0.61  CALCIUM  --  7.3*   Liver Function Tests:  Recent Labs Lab 02/21/16 0016 02/21/16 0537  AST 19 13*  ALT 7* 6*  ALKPHOS 39 28*  BILITOT 0.2* 0.3  PROT 6.2* 4.4*  ALBUMIN 3.2* 2.3*    Recent Labs Lab 02/21/16 0016  LIPASE 23   No results for input(s): AMMONIA in the last 168 hours. CBC:  Recent Labs Lab 02/21/16 0016  02/22/16 0023 02/22/16 0748 02/22/16 1349 02/22/16 2155 02/23/16 0612 02/24/16 0227  WBC 8.9  < > 12.3* 12.3* 13.5* 12.0* 8.7  --   NEUTROABS 6.7   --   --   --   --   --   --   --   HGB 7.4*  < > 7.6* 8.4* 7.9* 7.8* 7.3* 7.0*  HCT 22.2*  < > 21.9* 24.3* 22.8* 22.6* 21.5* 21.3*  MCV 96.5  < > 85.9 86.2 88.7 87.3 89.2  --   PLT 384  < > 143* 173 178 193 195  --   < > = values in this interval not displayed. Cardiac Enzymes: No results for input(s): CKTOTAL, CKMB, CKMBINDEX, TROPONINI in the last 168 hours. BNP: BNP (last 3 results) No  results for input(s): BNP in the last 8760 hours.  ProBNP (last 3 results) No results for input(s): PROBNP in the last 8760 hours.  CBG:  Recent Labs Lab 02/21/16 1614 02/22/16 1107 02/22/16 1719 02/23/16 0010 02/23/16 0738  GLUCAP 77 78 86 109* 88       Signed:  Louellen Molder MD.  [facility] 02/24/2016, 9:44 AM

## 2016-02-24 NOTE — Discharge Instructions (Signed)
Peptic Ulcer ° °A peptic ulcer is a sore in the lining of your esophagus (esophageal ulcer), stomach (gastric ulcer), or in the first part of your small intestine (duodenal ulcer). The ulcer causes erosion into the deeper tissue. °CAUSES  °Normally, the lining of the stomach and the small intestine protects itself from the acid that digests food. The protective lining can be damaged by: °· An infection caused by a bacterium called Helicobacter pylori (H. pylori). °· Regular use of nonsteroidal anti-inflammatory drugs (NSAIDs), such as ibuprofen or aspirin. °· Smoking tobacco. °Other risk factors include being older than 50, drinking alcohol excessively, and having a family history of ulcer disease.  °SYMPTOMS  °· Burning pain or gnawing in the area between the chest and the belly button. °· Heartburn. °· Nausea and vomiting. °· Bloating. °The pain can be worse on an empty stomach and at night. If the ulcer results in bleeding, it can cause: °· Black, tarry stools. °· Vomiting of bright red blood. °· Vomiting of coffee-ground-looking materials. °DIAGNOSIS  °A diagnosis is usually made based upon your history and an exam. Other tests and procedures may be performed to find the cause of the ulcer. Finding a cause will help determine the best treatment. Tests and procedures may include: °· Blood tests, stool tests, or breath tests to check for the bacterium H. pylori. °· An upper gastrointestinal (GI) series of the esophagus, stomach, and small intestine. °· An endoscopy to examine the esophagus, stomach, and small intestine. °· A biopsy. °TREATMENT  °Treatment may include: °· Eliminating the cause of the ulcer, such as smoking, NSAIDs, or alcohol. °· Medicines to reduce the amount of acid in your digestive tract. °· Antibiotic medicines if the ulcer is caused by the H. pylori bacterium. °· An upper endoscopy to treat a bleeding ulcer. °· Surgery if the bleeding is severe or if the ulcer created a hole somewhere in the  digestive system. °HOME CARE INSTRUCTIONS  °· Avoid tobacco, alcohol, and caffeine. Smoking can increase the acid in the stomach, and continued smoking will impair the healing of ulcers. °· Avoid foods and drinks that seem to cause discomfort or aggravate your ulcer. °· Only take medicines as directed by your caregiver. Do not substitute over-the-counter medicines for prescription medicines without talking to your caregiver. °· Keep any follow-up appointments and tests as directed. °SEEK MEDICAL CARE IF:  °· Your do not improve within 7 days of starting treatment. °· You have ongoing indigestion or heartburn. °SEEK IMMEDIATE MEDICAL CARE IF:  °· You have sudden, sharp, or persistent abdominal pain. °· You have bloody or dark black, tarry stools. °· You vomit blood or vomit that looks like coffee grounds. °· You become light-headed, weak, or feel faint. °· You become sweaty or clammy. °MAKE SURE YOU:  °· Understand these instructions. °· Will watch your condition. °· Will get help right away if you are not doing well or get worse. °  °This information is not intended to replace advice given to you by your health care provider. Make sure you discuss any questions you have with your health care provider. °  °Document Released: 10/06/2000 Document Revised: 10/30/2014 Document Reviewed: 05/08/2012 °Elsevier Interactive Patient Education ©2016 [employer] ° °

## 2016-02-24 NOTE — Progress Notes (Signed)
Started pt blood administration at 10:05, at 10:10 pt IV infiltrated and pt received 10 mL of blood.  Nurse attempt x2 for new IV access with no success.  IV team paged to place new IV.  Pt refused to be stuck again for IV access and refuses blood transfusion.  Discussed and educated Kimberly Bruce on the importance of receiving blood administration and she still refused the IV access and blood administration.  Dr. Clementeen Graham notified and he was okay with pt refusing blood transfusion and stated she could be discharged home.

## 2016-02-24 NOTE — Plan of Care (Signed)
Problem: Safety: Goal: Ability to remain free from injury will improve Outcome: Completed/Met Date Met:  02/24/16 Pt remained free from injury during this admission      Problem: Pain Managment: Goal: General experience of comfort will improve Outcome: Completed/Met Date Met:  02/24/16 Pt remains free from pain at this time   Problem: Physical Regulation: Goal: Ability to maintain clinical measurements within normal limits will improve Outcome: Completed/Met Date Met:  02/24/16 Pt able to ambulate independently with no problems   Problem: Education: Goal: Ability to identify signs and symptoms of gastrointestinal bleeding will improve Outcome: Completed/Met Date Met:  02/24/16 Pt educated on signs and symptoms of GI bleed     Problem: Fluid Volume: Goal: Will show no signs and symptoms of excessive bleeding Outcome: Progressing Pt still experiencing black tarry stools, but is getting better from admission         

## 2016-02-25 LAB — TYPE AND SCREEN
ABO/RH(D): O POS
ANTIBODY SCREEN: NEGATIVE
UNIT DIVISION: 0

## 2016-03-01 ENCOUNTER — Other Ambulatory Visit: Payer: Managed Care, Other (non HMO)

## 2016-03-09 ENCOUNTER — Encounter: Payer: Self-pay | Admitting: Gastroenterology

## 2016-03-09 ENCOUNTER — Other Ambulatory Visit (INDEPENDENT_AMBULATORY_CARE_PROVIDER_SITE_OTHER): Payer: Managed Care, Other (non HMO)

## 2016-03-09 ENCOUNTER — Ambulatory Visit (INDEPENDENT_AMBULATORY_CARE_PROVIDER_SITE_OTHER): Payer: Managed Care, Other (non HMO) | Admitting: Gastroenterology

## 2016-03-09 VITALS — BP 106/60 | HR 84 | Ht 59.0 in | Wt 130.0 lb

## 2016-03-09 DIAGNOSIS — K257 Chronic gastric ulcer without hemorrhage or perforation: Secondary | ICD-10-CM

## 2016-03-09 DIAGNOSIS — R760 Raised antibody titer: Secondary | ICD-10-CM | POA: Diagnosis not present

## 2016-03-09 DIAGNOSIS — R768 Other specified abnormal immunological findings in serum: Secondary | ICD-10-CM

## 2016-03-09 LAB — CBC WITH DIFFERENTIAL/PLATELET
BASOS ABS: 0 10*3/uL (ref 0.0–0.1)
BASOS PCT: 0.5 % (ref 0.0–3.0)
EOS ABS: 0.2 10*3/uL (ref 0.0–0.7)
Eosinophils Relative: 2.2 % (ref 0.0–5.0)
HEMATOCRIT: 26.9 % — AB (ref 36.0–46.0)
LYMPHS PCT: 22.1 % (ref 12.0–46.0)
Lymphs Abs: 1.6 10*3/uL (ref 0.7–4.0)
MCHC: 33.1 g/dL (ref 30.0–36.0)
MCV: 90.1 fl (ref 78.0–100.0)
Monocytes Absolute: 0.5 10*3/uL (ref 0.1–1.0)
Monocytes Relative: 6.6 % (ref 3.0–12.0)
Neutro Abs: 5 10*3/uL (ref 1.4–7.7)
Neutrophils Relative %: 68.6 % (ref 43.0–77.0)
Platelets: 770 10*3/uL — ABNORMAL HIGH (ref 150.0–400.0)
RBC: 2.99 Mil/uL — AB (ref 3.87–5.11)
RDW: 16.7 % — ABNORMAL HIGH (ref 11.5–15.5)
WBC: 7.2 10*3/uL (ref 4.0–10.5)

## 2016-03-09 MED ORDER — PANTOPRAZOLE SODIUM 40 MG PO TBEC: 40.0000 mg | DELAYED_RELEASE_TABLET | Freq: Two times a day (BID) | ORAL | Status: DC

## 2016-03-09 MED ORDER — FERROUS SULFATE ER 142 (45 FE) MG PO TBCR: EXTENDED_RELEASE_TABLET | ORAL | Status: DC

## 2016-03-09 NOTE — Patient Instructions (Signed)
You have been scheduled for an endoscopy. Please follow written instructions given to you at your visit today. If you use inhalers (even only as needed), please bring them with you on the day of your procedure. Your physician has requested that you go to www.startemmi.com and enter the access code given to you at your visit today. This web site gives a general overview about your procedure. However, you should still follow specific instructions given to you by our office regarding your preparation for the procedure.  We will send in your Iron to your pharmacy

## 2016-03-09 NOTE — Progress Notes (Signed)
Kimberly Bruce    [id number]    1971-08-19  Primary Care Physician:JEGEDE, Gabrielle Dare, MD  Referring Physician: Tresa Garter, MD [address] [place],  [address]  Chief complaint:  Gastric ulcer HPI: 45 yr F was recently admitted in the hospital with melena and daily anemia in the setting of bleeding gastric ulcer here for follow-up visit. Patient had elevated IgG level for H. pylori and she started taking Pylera 3 days ago. Denies any GI specific symptoms, no nausea, vomiting, abdominal pain, melena or bright red blood per rectum    Outpatient Encounter Prescriptions as of 03/09/2016  Medication Sig  . bismuth-metronidazole-tetracycline (PYLERA) 140-125-125 MG capsule Take 3 capsules by mouth 4 (four) times daily -  before meals and at bedtime.  . Ferrous Sulfate (SLOW FE) 142 (45 Fe) MG TBCR One by mouth twice a day  . HYDROcodone-acetaminophen (NORCO) 10-325 MG tablet Take 0.5-1 tablets by mouth every 8 (eight) hours as needed for moderate pain.   Marland Kitchen LORazepam (ATIVAN) 1 MG tablet Take 1 tablet (1 mg total) by mouth every 8 (eight) hours. (Patient taking differently: Take 1 mg by mouth daily. )  . pantoprazole (PROTONIX) 40 MG tablet Take 1 tablet (40 mg total) by mouth 2 (two) times daily.  Marland Kitchen zolpidem (AMBIEN) 5 MG tablet Take 1 tablet (5 mg total) by mouth at bedtime as needed for sleep.  . [DISCONTINUED] pantoprazole (PROTONIX) 40 MG tablet Take 1 tablet (40 mg total) by mouth 2 (two) times daily.   No facility-administered encounter medications on file as of 03/09/2016.    Allergies as of 03/09/2016 - Review Complete 03/09/2016  Allergen Reaction Noted  . Citalopram Nausea And Vomiting 04/07/2013    Past Medical History  Diagnosis Date  . Anxiety 1996  . Insomnia   . Ovarian cyst   . Anemia     since teenage years    Past Surgical History  Procedure Laterality Date  . Cesarean section      x2   . Wisdom tooth extraction    .  Laparoscopy N/A 01/30/2014    Procedure: LAPAROSCOPY DIAGNOSTIC;  Surgeon: Lovenia Kim, MD;  Location: [facility];  Service: Gynecology;  Laterality: N/A;  . Robotic assisted laparoscopic lysis of adhesion N/A 01/30/2014    Procedure: Possible ROBOTIC ASSISTED LAPAROSCOPIC LYSIS OF ADHESION WITH LEFT SALPINGO OOPHORECTOMY;  Surgeon: Lovenia Kim, MD;  Location: [facility];  Service: Gynecology;  Laterality: N/A;  . Esophagogastroduodenoscopy N/A 02/21/2016    Procedure: ESOPHAGOGASTRODUODENOSCOPY (EGD);  Surgeon: Mauri Pole, MD;  Location: [facility];  Service: Endoscopy;  Laterality: N/A;    Family History  Problem Relation Age of Onset  . Aneurysm Father     brain  . Hepatitis C Father   . Breast cancer Maternal Aunt     breast  . Cancer Maternal Uncle     prostate?  Marland Kitchen Alcohol abuse Maternal Grandmother   . Heart disease Paternal Grandmother   . Other Paternal Grandfather     unknown  . Lung cancer Sister 3    died    Social History   Social History  . Marital Status: Single    Spouse Name: N/A  . Number of Children: N/A  . Years of Education: N/A   Occupational History  . Not on file.   Social History Main Topics  . Smoking status: Never Smoker   . Smokeless tobacco: Never Used  . Alcohol  Use: No     Comment: occasionally  . Drug Use: No  . Sexual Activity: No   Other Topics Concern  . Not on file   Social History Narrative   Her 46yo daughter [name] lives with her, [name], works as Employment IT sales professional at [employer], exercising 5 days per week.  Likes to do kickboxing.  In relationship 2 years.       Review of systems: Review of Systems  Constitutional: Negative for fever and chills.  HENT: Negative.   Eyes: Negative for blurred vision.  Respiratory: Negative for cough, shortness of breath and wheezing.   Cardiovascular: Negative for chest pain and palpitations.  Gastrointestinal: as per HPI Genitourinary:  Negative for dysuria, urgency, frequency and hematuria.  Musculoskeletal: Negative for myalgias, back pain and joint pain.  Skin: Negative for itching and rash.  Neurological: Negative for dizziness, tremors, focal weakness, seizures and loss of consciousness.  Endo/Heme/Allergies: Negative for environmental allergies.  Psychiatric/Behavioral: Negative for depression, suicidal ideas and hallucinations.  All other systems reviewed and are negative.   Physical Exam: Filed Vitals:   03/09/16 0832  BP: 106/60  Pulse: 84   Gen:      No acute distress HEENT:  EOMI, sclera anicteric Neck:     No masses; no thyromegaly Lungs:    Clear to auscultation bilaterally; normal respiratory effort CV:         Regular rate and rhythm; no murmurs Abd:      + bowel sounds; soft, non-tender; no palpable masses, no distension Ext:    No edema; adequate peripheral perfusion Skin:      Warm and dry; no rash Neuro: alert and oriented x 3 Psych: normal mood and affect  Data Reviewed:  Reviewed chart in epic   Assessment and Plan/Recommendations:  45 year old female with acute upper GI bleed in the setting of gastric ulcer, [facility] classification 1A with a visible vessel that was actively spurting blood, it is post-endoscopic therapy with bipolar cautery and hemostatic clips here for follow-up visit Patient is doing well overall and denies any complaints Hgb stable, slightly improved on repeat CBC Complete10 day course of Pylera We'll schedule for EGD in 4-6 weeks to document healing of the gastric ulcer and also H. pylori eradication Return as needed   Damaris Hippo , MD [phone] Mon-Fri 8a-5p [phone] after 5p, weekends, holidays  CC: Tresa Garter, MD

## 2016-03-16 ENCOUNTER — Encounter: Payer: Self-pay | Admitting: Gastroenterology

## 2016-03-16 ENCOUNTER — Other Ambulatory Visit: Payer: Self-pay

## 2016-03-24 ENCOUNTER — Encounter: Payer: Managed Care, Other (non HMO) | Admitting: Gastroenterology

## 2016-03-28 ENCOUNTER — Ambulatory Visit (AMBULATORY_SURGERY_CENTER): Payer: Managed Care, Other (non HMO) | Admitting: Gastroenterology

## 2016-03-28 ENCOUNTER — Encounter: Payer: Self-pay | Admitting: Gastroenterology

## 2016-03-28 VITALS — BP 117/73 | HR 87 | Temp 97.5°F | Resp 12 | Ht 59.0 in | Wt 130.0 lb

## 2016-03-28 DIAGNOSIS — K257 Chronic gastric ulcer without hemorrhage or perforation: Secondary | ICD-10-CM | POA: Diagnosis not present

## 2016-03-28 MED ORDER — SODIUM CHLORIDE 0.9 % IV SOLN: 500.0000 mL | INTRAVENOUS | Status: DC

## 2016-03-28 NOTE — Op Note (Signed)
[facility] Patient Name: Kimberly Bruce Procedure Date: 03/28/2016 9:39 AM MRN: [id number] Endoscopist: Mauri Pole , MD Age: 45 Referring MD:  Date of Birth: 04-Jun-1971 Gender: Female Procedure:                Upper GI endoscopy Indications:              Biopsy is indicated, Assessment of patient's health                            related to previous ulcer for potential impact on                            long-term nonsteroidal anti-inflammatory drug                            therapy, Exclusion of ulcer of the GI tract,                            Follow-up of ulcer of the GI tract Medicines:                Monitored Anesthesia Care Procedure:                Pre-Anesthesia Assessment:                           - Prior to the procedure, a History and Physical                            was performed, and patient medications and                            allergies were reviewed. The patient's tolerance of                            previous anesthesia was also reviewed. The risks                            and benefits of the procedure and the sedation                            options and risks were discussed with the patient.                            All questions were answered, and informed consent                            was obtained. Prior Anticoagulants: The patient has                            taken no previous anticoagulant or antiplatelet                            agents. ASA Grade Assessment: II - A patient with  mild systemic disease. After reviewing the risks                            and benefits, the patient was deemed in                            satisfactory condition to undergo the procedure.                           - Prior to the procedure, a History and Physical                            was performed, and patient medications and                            allergies were reviewed. The patient's tolerance of                             previous anesthesia was also reviewed. The risks                            and benefits of the procedure and the sedation                            options and risks were discussed with the patient.                            All questions were answered, and informed consent                            was obtained. Prior Anticoagulants: The patient has                            taken no previous anticoagulant or antiplatelet                            agents. ASA Grade Assessment: II - A patient with                            mild systemic disease. After reviewing the risks                            and benefits, the patient was deemed in                            satisfactory condition to undergo the procedure.                           After obtaining informed consent, the endoscope was                            passed under direct vision. Throughout the  procedure, the patient's blood pressure, pulse, and                            oxygen saturations were monitored continuously. The                            Model GIF-[id number] scope was introduced                            through the mouth, and advanced to the second part                            of duodenum. The upper GI endoscopy was                            accomplished without difficulty. The patient                            tolerated the procedure well. Scope In: Scope Out: Findings:                 The esophagus was normal.                           A few localized, less than 5 mm non-bleeding                            erosions were found in the gastric antrum at the                            site of prior gastric ulcers. There were no                            stigmata of recent bleeding. Biopsies were taken                            with a cold forceps for Helicobacter pylori testing                            and also to r/o dysplasia.                            The examined duodenum was normal. Complications:            No immediate complications. Estimated Blood Loss:     Estimated blood loss was minimal. Impression:               - Normal esophagus.                           - Non-bleeding erosive gastropathy. Biopsied.                           - Normal examined duodenum. Recommendation:           - Patient has a contact number available for  emergencies. The signs and symptoms of potential                            delayed complications were discussed with the                            patient. Return to normal activities tomorrow.                            Written discharge instructions were provided to the                            patient.                           - Resume previous diet.                           - Continue present medications.                           - Await pathology results.                           - No repeat upper endoscopy.                           - No ibuprofen, naproxen, or other non-steroidal                            anti-inflammatory drugs. Mauri Pole, MD 03/28/2016 10:03:22 AM This report has been signed electronically.

## 2016-03-28 NOTE — Progress Notes (Signed)
Late entry- I notified CRNA and Dr Silverio Decamp before the procedure that pt is sexually active, does not use any protection to prevent pregnancy and isn't sure if she is pregnant or not.  She had her period last week.  Ok to proceed with procedure

## 2016-03-28 NOTE — Progress Notes (Signed)
Called to room to assist during endoscopic procedure.  Patient ID and intended procedure confirmed with present staff. Received instructions for my participation in the procedure from the performing physician.  

## 2016-03-28 NOTE — Progress Notes (Signed)
Teeth unchanged after procedure.A and O x3. Report to RN. Tolerated MAC anesthesia well. 

## 2016-03-28 NOTE — Patient Instructions (Addendum)

## 2016-03-29 ENCOUNTER — Telehealth: Payer: Self-pay

## 2016-03-29 NOTE — Telephone Encounter (Signed)
  Follow up Call-  Call back number 03/28/2016  Post procedure Call Back phone  # [phone]  Permission to leave phone message Yes     Patient questions:  Do you have a fever, pain , or abdominal swelling? No. Pain Score  0 *  Have you tolerated food without any problems? Yes.    Have you been able to return to your normal activities? Yes.    Do you have any questions about your discharge instructions: Diet   No. Medications  No. Follow up visit  No.  Do you have questions or concerns about your Care? No.  Actions: * If pain score is 4 or above: No action needed, pain <4.

## 2016-04-05 ENCOUNTER — Encounter: Payer: Self-pay | Admitting: Gastroenterology

## 2016-06-09 ENCOUNTER — Ambulatory Visit: Payer: Managed Care, Other (non HMO) | Admitting: Gastroenterology

## 2017-05-13 ENCOUNTER — Emergency Department (HOSPITAL_BASED_OUTPATIENT_CLINIC_OR_DEPARTMENT_OTHER): Payer: Self-pay

## 2017-05-13 ENCOUNTER — Encounter (HOSPITAL_BASED_OUTPATIENT_CLINIC_OR_DEPARTMENT_OTHER): Payer: Self-pay | Admitting: Emergency Medicine

## 2017-05-13 ENCOUNTER — Emergency Department (HOSPITAL_BASED_OUTPATIENT_CLINIC_OR_DEPARTMENT_OTHER)
Admission: EM | Admit: 2017-05-13 | Discharge: 2017-05-13 | Disposition: A | Discharge status: Home / Self Care | Payer: Self-pay | Attending: Emergency Medicine | Admitting: Emergency Medicine

## 2017-05-13 DIAGNOSIS — R197 Diarrhea, unspecified: Secondary | ICD-10-CM | POA: Insufficient documentation

## 2017-05-13 DIAGNOSIS — Z79899 Other long term (current) drug therapy: Secondary | ICD-10-CM | POA: Insufficient documentation

## 2017-05-13 DIAGNOSIS — G8929 Other chronic pain: Secondary | ICD-10-CM

## 2017-05-13 DIAGNOSIS — R1084 Generalized abdominal pain: Secondary | ICD-10-CM | POA: Insufficient documentation

## 2017-05-13 DIAGNOSIS — M25561 Pain in right knee: Secondary | ICD-10-CM

## 2017-05-13 MED ORDER — MELOXICAM 7.5 MG PO TABS: 15.0000 mg | ORAL_TABLET | Freq: Every day | ORAL | 0 refills | Status: AC

## 2017-05-13 NOTE — ED Triage Notes (Signed)
Patient states that she has had intermittent pain to her right knee for "years" the patient reports that she started a new job recently that has her bending a lot more. This has caused her right knee to hurt worse

## 2017-05-13 NOTE — ED Provider Notes (Signed)
[place] DEPT [facility] Provider Note   CSN: [id number] Arrival date & time: 05/13/17  4431     History   Chief Complaint Chief Complaint  Patient presents with  . Knee Pain    HPI Kimberly Bruce is a 46 y.o. female.  46yo F w/ PMH below who p/w R knee pain. She reports years of intermittent pain in her right knee. Recently she has been doing a lot of bending over and walking around at work which seems to have exacerbated her knee pain. She began noticing her pain while at rest and it was more severe which is what prompted her to come to the ED. She denies any trauma, falls, or injury. No therapies or medications tried. She has never seen an orthopedic for this problem before. No fevers or recent illness.   The history is provided by the patient.  Knee Pain      Past Medical History:  Diagnosis Date  . Anemia    since teenage years  . Anxiety 1996  . Blood transfusion without reported diagnosis   . Insomnia   . Ovarian cyst     Patient Active Problem List   Diagnosis Date Noted  . UTI (lower urinary tract infection) 02/24/2016  . Helicobacter pylori ab+ 02/24/2016  . Acute gastric ulcer   . Acute GI bleeding 02/21/2016  . Acute blood loss anemia 02/21/2016  . Pregnancy test positive 02/21/2016  . Gastric ulcer with hemorrhage     Past Surgical History:  Procedure Laterality Date  . CESAREAN SECTION     x2   . ESOPHAGOGASTRODUODENOSCOPY N/A 02/21/2016   Procedure: ESOPHAGOGASTRODUODENOSCOPY (EGD);  Surgeon: Mauri Pole, MD;  Location: [facility] ENDOSCOPY;  Service: Endoscopy;  Laterality: N/A;  . LAPAROSCOPY N/A 01/30/2014   Procedure: LAPAROSCOPY DIAGNOSTIC;  Surgeon: Lovenia Kim, MD;  Location: [place] [facility];  Service: Gynecology;  Laterality: N/A;  . ROBOTIC ASSISTED LAPAROSCOPIC LYSIS OF ADHESION N/A 01/30/2014   Procedure: Possible ROBOTIC ASSISTED LAPAROSCOPIC LYSIS OF ADHESION WITH LEFT SALPINGO OOPHORECTOMY;  Surgeon: Lovenia Kim, MD;  Location: [facility] [facility];   Service: Gynecology;  Laterality: N/A;  . WISDOM TOOTH EXTRACTION      OB History    No data available       Home Medications    Prior to Admission medications   Medication Sig Start Date End Date Taking? Authorizing Provider  bismuth-metronidazole-tetracycline [facility]) [id number] MG capsule Take 3 capsules by mouth 4 (four) times daily -  before meals and at bedtime. 02/24/16 03/04/16  Dhungel, Flonnie Overman, MD  Ferrous Sulfate (SLOW FE) 142 (45 Fe) MG TBCR One by mouth twice a day Patient not taking: Reported on 03/28/2016 03/09/16   Mauri Pole, MD  HYDROcodone-acetaminophen (NORCO) 10-325 MG tablet Take 0.5-1 tablets by mouth every 8 (eight) hours as needed for moderate pain. Reported on 03/28/2016 02/18/16   [provider]  LORazepam (ATIVAN) 1 MG tablet Take 1 tablet (1 mg total) by mouth every 8 (eight) hours. Patient taking differently: Take 1 mg by mouth daily.  08/31/14   Tysinger, Camelia Eng, PA-C  meloxicam (MOBIC) 7.5 MG tablet Take 2 tablets (15 mg total) by mouth daily. 05/13/17 05/23/17  Little, Wenda Overland, MD  pantoprazole (PROTONIX) 40 MG tablet Take 1 tablet (40 mg total) by mouth 2 (two) times daily. 03/09/16   Mauri Pole, MD  zolpidem (AMBIEN) 5 MG tablet Take 1 tablet (5 mg total) by mouth at bedtime as needed for sleep. 02/24/16   Dhungel,  Flonnie Overman, MD    Family History Family History  Problem Relation Age of Onset  . Aneurysm Father        brain  . Hepatitis C Father   . Breast cancer Maternal Aunt        breast  . Cancer Maternal Uncle        prostate?  Marland Kitchen Heart disease Paternal Grandmother   . Alcohol abuse Maternal Grandmother   . Other Paternal Grandfather        unknown  . Lung cancer Sister 70       died  . Colon cancer Neg Hx   . Esophageal cancer Neg Hx   . Stomach cancer Neg Hx   . Rectal cancer Neg Hx     Social History Social History  Substance Use Topics  . Smoking status: Never Smoker  . Smokeless tobacco: Never Used  .  Alcohol use No     Comment: occasionally     Allergies   Citalopram   Review of Systems Review of Systems  Constitutional: Negative for fever.  Musculoskeletal: Positive for arthralgias and joint swelling.  Skin: Negative for color change.     Physical Exam Updated Vital Signs BP 118/70 (BP Location: Right Arm)   Pulse 72   Temp 98.2 F (36.8 C) (Oral)   Resp 18   Ht 4\' 11"  (1.499 m)   Wt 65.8 kg (145 lb)   LMP 04/12/2017   SpO2 100%   BMI 29.29 kg/m   Physical Exam  Constitutional: She is oriented to person, place, and time. She appears well-developed and well-nourished. No distress.  HENT:  Head: Normocephalic and atraumatic.  Eyes: Conjunctivae are normal.  Neck: Neck supple.  Cardiovascular: Intact distal pulses.   Musculoskeletal:  Trace joint effusion R knee, pain with flexion beyond 90 degrees; no joint laxity, negative anterior/posterior drawer sign; no tib/fib or ankle tenderness  Neurological: She is alert and oriented to person, place, and time.  Skin: Skin is warm and dry. No rash noted. No erythema.  Psychiatric: She has a normal mood and affect. Judgment normal.  Nursing note and vitals reviewed.    ED Treatments / Results  Labs (all labs ordered are listed, but only abnormal results are displayed) Labs Reviewed - No data to display  EKG  EKG Interpretation None       Radiology Dg Knee Complete 4 Views Right  Result Date: 05/13/2017 CLINICAL DATA:  46 year old female with intermittent right-sided knee pain for years EXAM: RIGHT KNEE - COMPLETE 4+ VIEW COMPARISON:  None. FINDINGS: No evidence of fracture, dislocation, or joint effusion. No evidence of arthropathy or other focal bone abnormality. Soft tissues are unremarkable. IMPRESSION: Negative. Electronically Signed   By: Jacqulynn Cadet M.D.   On: 05/13/2017 10:41    Procedures Procedures (including critical care time)  Medications Ordered in ED Medications - No data to  display   Initial Impression / Assessment and Plan / ED Course  I have reviewed the triage vital signs and the nursing notes.  Pertinent imaging results that were available during my care of the patient were reviewed by me and considered in my medical decision making (see chart for details).    PT w/ acute on chronic R knee pain, atraumatic. No signs/sx of infection or gout. Trace effusion on exam. XR negative. Discussed supportive measures including ice, elevation, NSAIDS. Instructed to f/u with sports medicine. PT voiced understanding of plan.   Final Clinical Impressions(s) / ED Diagnoses  Final diagnoses:  Chronic pain of right knee    New Prescriptions New Prescriptions   MELOXICAM (MOBIC) 7.5 MG TABLET    Take 2 tablets (15 mg total) by mouth daily.     Little, Wenda Overland, MD 05/13/17 1105

## 2019-01-27 ENCOUNTER — Encounter (HOSPITAL_COMMUNITY): Payer: Self-pay

## 2019-01-27 ENCOUNTER — Other Ambulatory Visit: Payer: Self-pay

## 2019-01-27 ENCOUNTER — Emergency Department (HOSPITAL_COMMUNITY)
Admission: EM | Admit: 2019-01-27 | Discharge: 2019-01-27 | Disposition: A | Discharge status: Home / Self Care | Payer: 59 | Attending: Emergency Medicine | Admitting: Emergency Medicine

## 2019-01-27 DIAGNOSIS — Z87891 Personal history of nicotine dependence: Secondary | ICD-10-CM | POA: Insufficient documentation

## 2019-01-27 DIAGNOSIS — Z20828 Contact with and (suspected) exposure to other viral communicable diseases: Secondary | ICD-10-CM | POA: Diagnosis not present

## 2019-01-27 DIAGNOSIS — R05 Cough: Secondary | ICD-10-CM | POA: Diagnosis present

## 2019-01-27 DIAGNOSIS — Z79899 Other long term (current) drug therapy: Secondary | ICD-10-CM | POA: Diagnosis not present

## 2019-01-27 DIAGNOSIS — Z91040 Latex allergy status: Secondary | ICD-10-CM | POA: Diagnosis not present

## 2019-01-27 DIAGNOSIS — J069 Acute upper respiratory infection, unspecified: Secondary | ICD-10-CM | POA: Diagnosis not present

## 2019-01-27 DIAGNOSIS — R5383 Other fatigue: Secondary | ICD-10-CM

## 2019-01-27 NOTE — ED Triage Notes (Signed)
Pt c/o weakness, fatigue, SHOB since last night. Pt also c/o dry cough.  Pt states that daughter's coworker recently passed from [place]. Pt lives with daughter.

## 2019-01-27 NOTE — ED Provider Notes (Signed)
[place] DEPT Provider Note   CSN: [id number] Arrival date & time: 01/27/19  1318    History   Chief Complaint Chief Complaint  Patient presents with  . Cough  . Fatigue    HPI Kimberly Bruce is a 48 y.o. female.     48 year old female presents with complaint of fatigue since last night and nonproductive mild cough.  Patient denies any fevers, chills, body aches, congestion, sore throat, nausea, vomiting, lack of smell or taste.  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  Kimberly Bruce was evaluated in Emergency Department on 01/27/2019 for the symptoms described in the history of present illness. She was evaluated in the context of the global COVID-19 pandemic, which necessitated consideration that the patient might be at risk for infection with the SARS-CoV-2 virus that causes COVID-19. Institutional protocols and algorithms that pertain to the evaluation of patients at risk for COVID-19 are in a state of rapid change based on information released by regulatory bodies including the CDC and federal and state organizations. These policies and algorithms were followed during the patient's care in the ED.      Past Medical History:  Diagnosis Date  . Anemia    since teenage years  . Anxiety 1996  . Blood transfusion without reported diagnosis   . Insomnia   . Ovarian cyst     Patient Active Problem List   Diagnosis Date Noted  . UTI (lower urinary tract infection) 02/24/2016  . Helicobacter pylori ab+ 02/24/2016  . Acute gastric ulcer   . Acute GI bleeding 02/21/2016  . Acute blood loss anemia 02/21/2016  . Pregnancy test positive 02/21/2016  . Gastric ulcer with hemorrhage     Past Surgical History:  Procedure Laterality Date  . CESAREAN SECTION     x2   . ESOPHAGOGASTRODUODENOSCOPY N/A 02/21/2016   Procedure:  ESOPHAGOGASTRODUODENOSCOPY (EGD);  Surgeon: Mauri Pole, MD;  Location: [facility] ENDOSCOPY;  Service: Endoscopy;  Laterality: N/A;  . LAPAROSCOPY N/A 01/30/2014   Procedure: LAPAROSCOPY DIAGNOSTIC;  Surgeon: Lovenia Kim, MD;  Location: [place] [facility];  Service: Gynecology;  Laterality: N/A;  . ROBOTIC ASSISTED LAPAROSCOPIC LYSIS OF ADHESION N/A 01/30/2014   Procedure: Possible ROBOTIC ASSISTED LAPAROSCOPIC LYSIS OF ADHESION WITH LEFT SALPINGO OOPHORECTOMY;  Surgeon: Lovenia Kim, MD;  Location: [facility];  Service: Gynecology;  Laterality: N/A;  . WISDOM TOOTH EXTRACTION       OB History   No obstetric history on file.      Home Medications    Prior to Admission medications   Medication Sig Start Date End Date Taking? Authorizing Provider  bismuth-metronidazole-tetracycline [facility]) [phone] MG capsule Take 3 capsules by mouth 4 (four) times daily -  before meals and at bedtime. 02/24/16 03/04/16  Dhungel, Flonnie Overman, MD  Ferrous Sulfate (SLOW FE) 142 (45 Fe) MG TBCR One by mouth twice a day Patient not taking: Reported on 03/28/2016 03/09/16   Mauri Pole, MD  HYDROcodone-acetaminophen (NORCO) 10-325 MG tablet Take 0.5-1 tablets by mouth every 8 (eight) hours as needed for moderate pain. Reported on 03/28/2016 02/18/16   [provider]  LORazepam (ATIVAN) 1 MG tablet Take 1 tablet (1 mg total) by mouth every 8 (eight) hours. Patient taking differently: Take 1 mg by mouth daily.  08/31/14   Tysinger, Camelia Eng, PA-C  pantoprazole (PROTONIX) 40 MG tablet Take 1 tablet (40 mg total) by  mouth 2 (two) times daily. 03/09/16   Mauri Pole, MD  zolpidem (AMBIEN) 5 MG tablet Take 1 tablet (5 mg total) by mouth at bedtime as needed for sleep. 02/24/16   Dhungel, Flonnie Overman, MD    Family History Family History  Problem Relation Age of Onset  . Aneurysm Father        brain  . Hepatitis C Father   . Breast cancer Maternal Aunt        breast  . Cancer Maternal Uncle        prostate?  Marland Kitchen  Heart disease Paternal Grandmother   . Alcohol abuse Maternal Grandmother   . Other Paternal Grandfather        unknown  . Lung cancer Sister 46       died  . Colon cancer Neg Hx   . Esophageal cancer Neg Hx   . Stomach cancer Neg Hx   . Rectal cancer Neg Hx     Social History Social History   Tobacco Use  . Smoking status: Never Smoker  . Smokeless tobacco: Never Used  Substance Use Topics  . Alcohol use: No    Alcohol/week: 0.0 standard drinks    Comment: occasionally  . Drug use: No     Allergies   Citalopram   Review of Systems Review of Systems  Constitutional: Positive for fatigue. Negative for chills and fever.  HENT: Negative for congestion, sneezing and sore throat.   Eyes: Negative for discharge and redness.  Respiratory: Positive for cough. Negative for chest tightness and shortness of breath.   Cardiovascular: Negative for chest pain.  Gastrointestinal: Negative for abdominal pain, constipation, diarrhea, nausea and vomiting.  Skin: Negative for rash and wound.  Allergic/Immunologic: Negative for immunocompromised state.  Hematological: Negative for adenopathy.  Psychiatric/Behavioral: Negative for confusion.  All other systems reviewed and are negative.    Physical Exam Updated Vital Signs BP (!) 158/95 (BP Location: Right Arm)   Pulse (!) 105   Temp 98.2 F (36.8 C) (Oral)   Resp 16   Ht 4\' 11"  (1.499 m)   Wt 72.6 kg   LMP 04/12/2017   SpO2 100%   BMI 32.32 kg/m   Physical Exam Vitals signs and nursing note reviewed.  Constitutional:      General: She is not in acute distress.    Appearance: She is well-developed. She is not diaphoretic.  HENT:     Head: Normocephalic and atraumatic.     Right Ear: Tympanic membrane and ear canal normal.     Left Ear: Tympanic membrane and ear canal normal.     Nose: Nose normal.     Mouth/Throat:     Mouth: Mucous membranes are moist.  Neck:     Musculoskeletal: Neck supple.  Cardiovascular:      Rate and Rhythm: Normal rate and regular rhythm.     Pulses: Normal pulses.     Heart sounds: Normal heart sounds.  Pulmonary:     Effort: Pulmonary effort is normal.     Breath sounds: Normal breath sounds.  Musculoskeletal:     Right lower leg: No edema.     Left lower leg: No edema.  Lymphadenopathy:     Cervical: No cervical adenopathy.  Skin:    General: Skin is warm and dry.  Neurological:     Mental Status: She is alert and oriented to person, place, and time.  Psychiatric:        Behavior: Behavior normal.  ED Treatments / Results  Labs (all labs ordered are listed, but only abnormal results are displayed) Labs Reviewed - No data to display  EKG None  Radiology No results found.  Procedures Procedures (including critical care time)  Medications Ordered in ED Medications - No data to display   Initial Impression / Assessment and Plan / ED Course  I have reviewed the triage vital signs and the nursing notes.  Pertinent labs & imaging results that were available during my care of the patient were reviewed by me and considered in my medical decision making (see chart for details).  Clinical Course as of Jan 26 1399  Mon Jan 27, 6872  819 48 year old female presents with complaint of fatigue, concerned due to exposure to her daughter who is coworker recently died from COVID-28.  Patient denies any fevers.  Patient reports history of anemia since she was 48 years old, and denies any recent bleeding and declined CBC testing for anemia today.  Patient to monitor for fever or respiratory complaints and if she develops these she should contact telehealth service for further guidance and care and return to ER for severe concerning symptoms.  Patient verbalizes understanding of discharge instructions and plan.   [LM]    Clinical Course User Index [LM] Tacy Learn, PA-C      Final Clinical Impressions(s) / ED Diagnoses   Final diagnoses:  Fatigue,  unspecified type    ED Discharge Orders    None       Tacy Learn, PA-C 01/27/19 1400    Pattricia Boss, MD 01/31/19 1427

## 2019-02-24 ENCOUNTER — Emergency Department (HOSPITAL_COMMUNITY)
Admission: EM | Admit: 2019-02-24 | Discharge: 2019-02-24 | Disposition: A | Discharge status: Home / Self Care | Payer: 59 | Attending: Emergency Medicine | Admitting: Emergency Medicine

## 2019-02-24 ENCOUNTER — Other Ambulatory Visit: Payer: Self-pay

## 2019-02-24 ENCOUNTER — Encounter (HOSPITAL_COMMUNITY): Payer: Self-pay | Admitting: Emergency Medicine

## 2019-02-24 ENCOUNTER — Emergency Department (HOSPITAL_COMMUNITY): Payer: 59

## 2019-02-24 DIAGNOSIS — S161XXA Strain of muscle, fascia and tendon at neck level, initial encounter: Secondary | ICD-10-CM | POA: Insufficient documentation

## 2019-02-24 DIAGNOSIS — Y92410 Unspecified street and highway as the place of occurrence of the external cause: Secondary | ICD-10-CM | POA: Diagnosis not present

## 2019-02-24 DIAGNOSIS — S4992XA Unspecified injury of left shoulder and upper arm, initial encounter: Secondary | ICD-10-CM | POA: Diagnosis not present

## 2019-02-24 DIAGNOSIS — Y9389 Activity, other specified: Secondary | ICD-10-CM | POA: Insufficient documentation

## 2019-02-24 DIAGNOSIS — Y999 Unspecified external cause status: Secondary | ICD-10-CM | POA: Insufficient documentation

## 2019-02-24 DIAGNOSIS — Z79899 Other long term (current) drug therapy: Secondary | ICD-10-CM | POA: Diagnosis not present

## 2019-02-24 DIAGNOSIS — S199XXA Unspecified injury of neck, initial encounter: Secondary | ICD-10-CM | POA: Diagnosis present

## 2019-02-24 MED ORDER — TRAMADOL HCL 50 MG PO TABS: 50.0000 mg | ORAL_TABLET | Freq: Four times a day (QID) | ORAL | 0 refills | Status: DC | PRN

## 2019-02-24 MED ORDER — IBUPROFEN 800 MG PO TABS: 800.0000 mg | ORAL_TABLET | Freq: Three times a day (TID) | ORAL | 0 refills | Status: DC | PRN
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
## 2019-02-24 NOTE — Discharge Instructions (Signed)
Use ice and heat on your shoulder and neck.  The x-rays and CT scan did not show any abnormality.  Return here as needed.  Follow-up with the orthopedist provided.

## 2019-02-24 NOTE — ED Notes (Signed)
Patient transported to X-ray 
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
## 2019-02-24 NOTE — ED Notes (Signed)
Patient given discharge teaching and verbalized understanding. Patient ambulated out of ED with a steady gait. 

## 2019-03-21 ENCOUNTER — Inpatient Hospital Stay (HOSPITAL_COMMUNITY)
Admission: EM | Admit: 2019-03-21 | Discharge: 2019-03-25 | DRG: 378 | Disposition: A | Discharge status: Home / Self Care | Payer: 59 | Attending: Internal Medicine | Admitting: Internal Medicine

## 2019-03-21 DIAGNOSIS — Z79899 Other long term (current) drug therapy: Secondary | ICD-10-CM

## 2019-03-21 DIAGNOSIS — R000 Tachycardia, unspecified: Secondary | ICD-10-CM | POA: Diagnosis present

## 2019-03-21 DIAGNOSIS — R109 Unspecified abdominal pain: Secondary | ICD-10-CM | POA: Diagnosis present

## 2019-03-21 DIAGNOSIS — D62 Acute posthemorrhagic anemia: Secondary | ICD-10-CM | POA: Diagnosis not present

## 2019-03-21 DIAGNOSIS — K250 Acute gastric ulcer with hemorrhage: Principal | ICD-10-CM | POA: Diagnosis present

## 2019-03-21 DIAGNOSIS — Z90711 Acquired absence of uterus with remaining cervical stump: Secondary | ICD-10-CM

## 2019-03-21 DIAGNOSIS — E861 Hypovolemia: Secondary | ICD-10-CM | POA: Diagnosis present

## 2019-03-21 DIAGNOSIS — K921 Melena: Secondary | ICD-10-CM | POA: Diagnosis not present

## 2019-03-21 DIAGNOSIS — K253 Acute gastric ulcer without hemorrhage or perforation: Secondary | ICD-10-CM | POA: Diagnosis present

## 2019-03-21 DIAGNOSIS — R52 Pain, unspecified: Secondary | ICD-10-CM

## 2019-03-21 DIAGNOSIS — Z8619 Personal history of other infectious and parasitic diseases: Secondary | ICD-10-CM

## 2019-03-21 DIAGNOSIS — E876 Hypokalemia: Secondary | ICD-10-CM | POA: Diagnosis not present

## 2019-03-21 DIAGNOSIS — K922 Gastrointestinal hemorrhage, unspecified: Secondary | ICD-10-CM

## 2019-03-21 DIAGNOSIS — Z1159 Encounter for screening for other viral diseases: Secondary | ICD-10-CM

## 2019-03-21 DIAGNOSIS — Z8673 Personal history of transient ischemic attack (TIA), and cerebral infarction without residual deficits: Secondary | ICD-10-CM

## 2019-03-21 DIAGNOSIS — D649 Anemia, unspecified: Secondary | ICD-10-CM | POA: Diagnosis present

## 2019-03-21 LAB — COMPREHENSIVE METABOLIC PANEL
ALT: 6 U/L (ref 0–44)
AST: 12 U/L — ABNORMAL LOW (ref 15–41)
Albumin: 2.4 g/dL — ABNORMAL LOW (ref 3.5–5.0)
Alkaline Phosphatase: 46 U/L (ref 38–126)
Anion gap: 5 (ref 5–15)
BUN: 25 mg/dL — ABNORMAL HIGH (ref 6–20)
CO2: 18 mmol/L — ABNORMAL LOW (ref 22–32)
Calcium: 6.2 mg/dL — CL (ref 8.9–10.3)
Chloride: 122 mmol/L — ABNORMAL HIGH (ref 98–111)
Creatinine, Ser: 0.47 mg/dL (ref 0.44–1.00)
GFR calc Af Amer: >60 mL/min (ref >60)
GFR calc non Af Amer: >60 mL/min (ref >60)
Glucose, Bld: 87 mg/dL (ref 70–99)
Potassium: 2.5 mmol/L — CL (ref 3.5–5.1)
Sodium: 145 mmol/L (ref 135–145)
Total Bilirubin: 0.2 mg/dL — ABNORMAL LOW (ref 0.3–1.2)
Total Protein: 4.7 g/dL — ABNORMAL LOW (ref 6.5–8.1)

## 2019-03-21 LAB — HEMOGLOBIN AND HEMATOCRIT, BLOOD
HCT: 19.2 % — ABNORMAL LOW (ref 36.0–46.0)
Hemoglobin: 5.9 g/dL — CL (ref 12.0–15.0)

## 2019-03-21 LAB — CBC
HCT: 22.5 % — ABNORMAL LOW (ref 36.0–46.0)
Hemoglobin: 7 g/dL — ABNORMAL LOW (ref 12.0–15.0)
MCH: 31.3 pg (ref 26.0–34.0)
MCHC: 31.1 g/dL (ref 30.0–36.0)
MCV: 100.4 fL — ABNORMAL HIGH (ref 80.0–100.0)
Platelets: 302 10*3/uL (ref 150–400)
RBC: 2.24 MIL/uL — ABNORMAL LOW (ref 3.87–5.11)
RDW: 13.3 % (ref 11.5–15.5)
WBC: 8.6 10*3/uL (ref 4.0–10.5)
nRBC: 0 % (ref 0.0–0.2)

## 2019-03-21 LAB — SARS CORONAVIRUS 2 BY RT PCR (HOSPITAL ORDER, PERFORMED IN ~~LOC~~ HOSPITAL LAB): SARS Coronavirus 2: NEGATIVE

## 2019-03-21 LAB — PREPARE RBC (CROSSMATCH)

## 2019-03-21 LAB — MAGNESIUM: Magnesium: 1.5 mg/dL — ABNORMAL LOW (ref 1.7–2.4)

## 2019-03-21 LAB — LIPASE, BLOOD: Lipase: 21 U/L (ref 11–51)

## 2019-03-21 LAB — POC OCCULT BLOOD, ED: Fecal Occult Bld: POSITIVE — AB

## 2019-03-21 MED ORDER — MORPHINE SULFATE (PF) 4 MG/ML IV SOLN
6.0000 mg | Freq: Once | INTRAVENOUS | Status: AC
  Administered 2019-03-21: 6 mg via INTRAVENOUS
  Filled 2019-03-21: qty 2

## 2019-03-21 MED ORDER — ACETAMINOPHEN 325 MG PO TABS: 650.0000 mg | ORAL_TABLET | Freq: Four times a day (QID) | ORAL | Status: DC | PRN

## 2019-03-21 MED ORDER — MORPHINE SULFATE (PF) 2 MG/ML IV SOLN
2.0000 mg | INTRAVENOUS | Status: DC | PRN
  Administered 2019-03-21 – 2019-03-25 (×8): 2 mg via INTRAVENOUS
  Filled 2019-03-21 (×8): qty 1

## 2019-03-21 MED ORDER — SODIUM CHLORIDE 0.9 % IV BOLUS
2000.0000 mL | Freq: Once | INTRAVENOUS | Status: AC
  Administered 2019-03-21: 2000 mL via INTRAVENOUS

## 2019-03-21 MED ORDER — SODIUM CHLORIDE 0.9 % IV SOLN
8.0000 mg/h | INTRAVENOUS | Status: DC
  Administered 2019-03-21 – 2019-03-24 (×5): 8 mg/h via INTRAVENOUS
  Filled 2019-03-21 (×8): qty 80

## 2019-03-21 MED ORDER — SODIUM CHLORIDE 0.9 % IV SOLN
INTRAVENOUS | Status: DC
  Administered 2019-03-22: 200 mL via INTRAVENOUS

## 2019-03-21 MED ORDER — SODIUM CHLORIDE 0.9 % IV SOLN
INTRAVENOUS | Status: DC
  Administered 2019-03-21 – 2019-03-22 (×2): via INTRAVENOUS
  Administered 2019-03-22: 900 mL via INTRAVENOUS
  Administered 2019-03-23 – 2019-03-24 (×2): via INTRAVENOUS

## 2019-03-21 MED ORDER — CALCIUM CARBONATE ANTACID 500 MG PO CHEW
1.0000 | CHEWABLE_TABLET | Freq: Two times a day (BID) | ORAL | Status: DC
  Administered 2019-03-21 – 2019-03-25 (×7): 200 mg via ORAL
  Filled 2019-03-21 (×7): qty 1

## 2019-03-21 MED ORDER — ACETAMINOPHEN 650 MG RE SUPP: 650.0000 mg | Freq: Four times a day (QID) | RECTAL | Status: DC | PRN

## 2019-03-21 MED ORDER — ONDANSETRON HCL 4 MG/2ML IJ SOLN
4.0000 mg | Freq: Four times a day (QID) | INTRAMUSCULAR | Status: DC | PRN
  Administered 2019-03-22: 4 mg via INTRAVENOUS

## 2019-03-21 MED ORDER — TRAMADOL HCL 50 MG PO TABS
50.0000 mg | ORAL_TABLET | Freq: Four times a day (QID) | ORAL | Status: DC | PRN
  Administered 2019-03-22 – 2019-03-23 (×2): 50 mg via ORAL
  Filled 2019-03-21 (×2): qty 1

## 2019-03-21 MED ORDER — POTASSIUM CHLORIDE 10 MEQ/100ML IV SOLN
10.0000 meq | Freq: Once | INTRAVENOUS | Status: AC
  Administered 2019-03-21: 10 meq via INTRAVENOUS
  Filled 2019-03-21: qty 100

## 2019-03-21 MED ORDER — POTASSIUM CHLORIDE CRYS ER 20 MEQ PO TBCR
40.0000 meq | EXTENDED_RELEASE_TABLET | Freq: Once | ORAL | Status: AC
  Administered 2019-03-21: 40 meq via ORAL
  Filled 2019-03-21: qty 2

## 2019-03-21 MED ORDER — PANTOPRAZOLE SODIUM 40 MG IV SOLR
40.0000 mg | Freq: Two times a day (BID) | INTRAVENOUS | Status: DC
  Filled 2019-03-21: qty 40

## 2019-03-21 MED ORDER — ONDANSETRON HCL 4 MG PO TABS: 4.0000 mg | ORAL_TABLET | Freq: Four times a day (QID) | ORAL | Status: DC | PRN

## 2019-03-21 MED ORDER — PANTOPRAZOLE SODIUM 40 MG IV SOLR
80.0000 mg | Freq: Once | INTRAVENOUS | Status: AC
  Administered 2019-03-21: 80 mg via INTRAVENOUS
  Filled 2019-03-21: qty 80
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
## 2019-03-21 NOTE — ED Notes (Signed)
Bed: JE83 Expected date:  Expected time:  Means of arrival:  Comments: EMS abdominal pain and diarrhea
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
## 2019-03-21 NOTE — ED Triage Notes (Signed)
Transported by [facility] from home-- onset of abdominal pain, generalized weakness x 4 days. 1 episode of black runny stool that occurred this morning. EMS administered 500 cc of NS. HR 140 and BP 105/72.
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
## 2019-03-22 ENCOUNTER — Encounter (HOSPITAL_COMMUNITY): Payer: Self-pay | Admitting: Internal Medicine

## 2019-03-22 ENCOUNTER — Other Ambulatory Visit: Payer: Self-pay

## 2019-03-22 ENCOUNTER — Inpatient Hospital Stay (HOSPITAL_COMMUNITY): Payer: 59 | Admitting: Certified Registered"

## 2019-03-22 ENCOUNTER — Encounter (HOSPITAL_COMMUNITY)
Admission: EM | Disposition: A | Discharge status: Home / Self Care | Payer: Self-pay | Source: Home / Self Care | Attending: Internal Medicine

## 2019-03-22 DIAGNOSIS — K259 Gastric ulcer, unspecified as acute or chronic, without hemorrhage or perforation: Secondary | ICD-10-CM | POA: Diagnosis not present

## 2019-03-22 DIAGNOSIS — Z8673 Personal history of transient ischemic attack (TIA), and cerebral infarction without residual deficits: Secondary | ICD-10-CM | POA: Diagnosis not present

## 2019-03-22 DIAGNOSIS — R1031 Right lower quadrant pain: Secondary | ICD-10-CM | POA: Diagnosis not present

## 2019-03-22 DIAGNOSIS — Z1159 Encounter for screening for other viral diseases: Secondary | ICD-10-CM | POA: Diagnosis not present

## 2019-03-22 DIAGNOSIS — K253 Acute gastric ulcer without hemorrhage or perforation: Secondary | ICD-10-CM | POA: Diagnosis not present

## 2019-03-22 DIAGNOSIS — K922 Gastrointestinal hemorrhage, unspecified: Secondary | ICD-10-CM | POA: Diagnosis present

## 2019-03-22 DIAGNOSIS — K250 Acute gastric ulcer with hemorrhage: Secondary | ICD-10-CM | POA: Diagnosis present

## 2019-03-22 DIAGNOSIS — E861 Hypovolemia: Secondary | ICD-10-CM | POA: Diagnosis present

## 2019-03-22 DIAGNOSIS — E876 Hypokalemia: Secondary | ICD-10-CM | POA: Diagnosis present

## 2019-03-22 DIAGNOSIS — Z8619 Personal history of other infectious and parasitic diseases: Secondary | ICD-10-CM | POA: Diagnosis not present

## 2019-03-22 DIAGNOSIS — Z79899 Other long term (current) drug therapy: Secondary | ICD-10-CM | POA: Diagnosis not present

## 2019-03-22 DIAGNOSIS — K921 Melena: Secondary | ICD-10-CM | POA: Diagnosis present

## 2019-03-22 DIAGNOSIS — D649 Anemia, unspecified: Secondary | ICD-10-CM | POA: Diagnosis not present

## 2019-03-22 DIAGNOSIS — R000 Tachycardia, unspecified: Secondary | ICD-10-CM | POA: Diagnosis not present

## 2019-03-22 DIAGNOSIS — Z90711 Acquired absence of uterus with remaining cervical stump: Secondary | ICD-10-CM | POA: Diagnosis not present

## 2019-03-22 DIAGNOSIS — D62 Acute posthemorrhagic anemia: Secondary | ICD-10-CM | POA: Diagnosis present

## 2019-03-22 HISTORY — PX: ESOPHAGOGASTRODUODENOSCOPY (EGD) WITH PROPOFOL: SHX5813

## 2019-03-22 HISTORY — PX: HOT HEMOSTASIS: SHX5433

## 2019-03-22 HISTORY — PX: HEMOSTASIS CONTROL: SHX6838

## 2019-03-22 LAB — COMPREHENSIVE METABOLIC PANEL
ALT: 5 U/L (ref 0–44)
AST: 13 U/L — ABNORMAL LOW (ref 15–41)
Albumin: 2.8 g/dL — ABNORMAL LOW (ref 3.5–5.0)
Alkaline Phosphatase: 52 U/L (ref 38–126)
Anion gap: 4 — ABNORMAL LOW (ref 5–15)
BUN: 12 mg/dL (ref 6–20)
CO2: 21 mmol/L — ABNORMAL LOW (ref 22–32)
Calcium: 8.1 mg/dL — ABNORMAL LOW (ref 8.9–10.3)
Chloride: 115 mmol/L — ABNORMAL HIGH (ref 98–111)
Creatinine, Ser: 0.54 mg/dL (ref 0.44–1.00)
GFR calc Af Amer: >60 mL/min (ref >60)
GFR calc non Af Amer: >60 mL/min (ref >60)
Glucose, Bld: 86 mg/dL (ref 70–99)
Potassium: 3.7 mmol/L (ref 3.5–5.1)
Sodium: 140 mmol/L (ref 135–145)
Total Bilirubin: 0.2 mg/dL — ABNORMAL LOW (ref 0.3–1.2)
Total Protein: 5.4 g/dL — ABNORMAL LOW (ref 6.5–8.1)

## 2019-03-22 LAB — CBC
HCT: 26.4 % — ABNORMAL LOW (ref 36.0–46.0)
Hemoglobin: 8.8 g/dL — ABNORMAL LOW (ref 12.0–15.0)
MCH: 31.8 pg (ref 26.0–34.0)
MCHC: 33.3 g/dL (ref 30.0–36.0)
MCV: 95.3 fL (ref 80.0–100.0)
Platelets: 232 10*3/uL (ref 150–400)
RBC: 2.77 MIL/uL — ABNORMAL LOW (ref 3.87–5.11)
RDW: 14.8 % (ref 11.5–15.5)
WBC: 9.2 10*3/uL (ref 4.0–10.5)
nRBC: 0 % (ref 0.0–0.2)

## 2019-03-22 LAB — HEMOGLOBIN AND HEMATOCRIT, BLOOD
HCT: 38.2 % (ref 36.0–46.0)
Hemoglobin: 12.9 g/dL (ref 12.0–15.0)

## 2019-03-22 LAB — ABO/RH: ABO/RH(D): O POS

## 2019-03-22 SURGERY — ESOPHAGOGASTRODUODENOSCOPY (EGD) WITH PROPOFOL
Anesthesia: Monitor Anesthesia Care

## 2019-03-22 MED ORDER — PROPOFOL 500 MG/50ML IV EMUL
INTRAVENOUS | Status: DC | PRN
  Administered 2019-03-22: 100 ug/kg/min via INTRAVENOUS

## 2019-03-22 MED ORDER — SODIUM CHLORIDE (PF) 0.9 % IJ SOLN
PREFILLED_SYRINGE | INTRAMUSCULAR | Status: DC | PRN
  Administered 2019-03-22: 14:00:00 .5 mL

## 2019-03-22 MED ORDER — PROPOFOL 10 MG/ML IV BOLUS
INTRAVENOUS | Status: AC
  Filled 2019-03-22: qty 60

## 2019-03-22 MED ORDER — CHLORHEXIDINE GLUCONATE CLOTH 2 % EX PADS
6.0000 | MEDICATED_PAD | Freq: Every day | CUTANEOUS | Status: DC
  Administered 2019-03-23: 6 via TOPICAL

## 2019-03-22 MED ORDER — EPINEPHRINE 1 MG/10ML IJ SOSY
PREFILLED_SYRINGE | INTRAMUSCULAR | Status: AC
  Filled 2019-03-22: qty 10

## 2019-03-22 MED ORDER — ORAL CARE MOUTH RINSE
15.0000 mL | Freq: Two times a day (BID) | OROMUCOSAL | Status: DC
  Administered 2019-03-22 – 2019-03-25 (×5): 15 mL via OROMUCOSAL

## 2019-03-22 MED ORDER — LIDOCAINE HCL (CARDIAC) PF 100 MG/5ML IV SOSY
PREFILLED_SYRINGE | INTRAVENOUS | Status: DC | PRN
  Administered 2019-03-22: 50 mg via INTRATRACHEAL

## 2019-03-22 MED ORDER — LACTATED RINGERS IV SOLN
INTRAVENOUS | Status: DC | PRN
  Administered 2019-03-22: 13:00:00 via INTRAVENOUS

## 2019-03-22 MED ORDER — PROPOFOL 500 MG/50ML IV EMUL
INTRAVENOUS | Status: DC | PRN
  Administered 2019-03-22: 30 mg via INTRAVENOUS

## 2019-03-22 SURGICAL SUPPLY — 15 items

## 2019-03-22 NOTE — Transfer of Care (Signed)
Immediate Anesthesia Transfer of Care Note  Patient: Kimberly Bruce  Procedure(s) Performed: ESOPHAGOGASTRODUODENOSCOPY (EGD) WITH PROPOFOL (N/A ) HEMOSTASIS CONTROL HOT HEMOSTASIS (ARGON PLASMA COAGULATION/BICAP) (N/A )  Patient Location: PACU and Endoscopy Unit  Anesthesia Type:MAC  Level of Consciousness: awake, oriented and patient cooperative  Airway & Oxygen Therapy: Patient Spontanous Breathing and Patient connected to face mask  Post-op Assessment: Report given to RN and Post -op Vital signs reviewed and stable  Post vital signs: Reviewed and stable  Last Vitals:  Vitals Value Taken Time  BP    Temp    Pulse    Resp    SpO2      Last Pain:  Vitals:   03/22/19 1304  TempSrc: Oral  PainSc: 0-No pain         Complications: No apparent anesthesia complications

## 2019-03-22 NOTE — Anesthesia Postprocedure Evaluation (Signed)
Anesthesia Post Note  Patient: Kimberly Bruce  Procedure(s) Performed: ESOPHAGOGASTRODUODENOSCOPY (EGD) WITH PROPOFOL (N/A ) HEMOSTASIS CONTROL HOT HEMOSTASIS (ARGON PLASMA COAGULATION/BICAP) (N/A )     Patient location during evaluation: PACU Anesthesia Type: MAC Level of consciousness: awake and alert Pain management: pain level controlled Vital Signs Assessment: post-procedure vital signs reviewed and stable Respiratory status: spontaneous breathing, nonlabored ventilation and respiratory function stable Cardiovascular status: stable, blood pressure returned to baseline and tachycardic Anesthetic complications: no    Last Vitals:  Vitals:   03/22/19 1401 03/22/19 1455  BP: (!) 141/77   Pulse: (!) 109 87  Resp:  18  Temp:    SpO2: 100% 99%    Last Pain:  Vitals:   03/22/19 1455  TempSrc:   PainSc: [place]

## 2019-03-22 NOTE — Anesthesia Preprocedure Evaluation (Addendum)
Anesthesia Evaluation  Patient identified by MRN, date of birth, ID band Patient awake    Reviewed: Allergy & Precautions, NPO status , Patient's Chart, lab work & pertinent test results  History of Anesthesia Complications Negative for: history of anesthetic complications  Airway Mallampati: II  TM Distance: >3 FB Neck ROM: Full    Dental  (+) Dental Advisory Given, Loose,    Pulmonary neg pulmonary ROS,    breath sounds clear to auscultation       Cardiovascular negative cardio ROS   Rhythm:Regular Rate:Tachycardia     Neuro/Psych PSYCHIATRIC DISORDERS Anxiety negative neurological ROS     GI/Hepatic Neg liver ROS, PUD,   Endo/Other  negative endocrine ROS  Renal/GU negative Renal ROS     Musculoskeletal negative musculoskeletal ROS (+)   Abdominal   Peds  Hematology  (+) anemia ,   Anesthesia Other Findings   Reproductive/Obstetrics                            Anesthesia Physical Anesthesia Plan  ASA: II  Anesthesia Plan: MAC   Post-op Pain Management:    Induction: Intravenous  PONV Risk Score and Plan: 2 and Propofol infusion and Treatment may vary due to age or medical condition  Airway Management Planned: Nasal Cannula and Natural Airway  Additional Equipment: None  Intra-op Plan:   Post-operative Plan:   Informed Consent: I have reviewed the patients History and Physical, chart, labs and discussed the procedure including the risks, benefits and alternatives for the proposed anesthesia with the patient or authorized representative who has indicated his/her understanding and acceptance.       Plan Discussed with: CRNA and Anesthesiologist  Anesthesia Plan Comments:        Anesthesia Quick Evaluation
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
## 2019-03-22 NOTE — Interval H&P Note (Signed)
History and Physical Interval Note:  03/22/2019 12:42 PM  [name]  has presented today for surgery, with the diagnosis of Upper GI bleed, anemia.  The various methods of treatment have been discussed with the patient and family. After consideration of risks, benefits and other options for treatment, the patient has consented to  Procedure(s) with comments: ESOPHAGOGASTRODUODENOSCOPY (EGD) WITH PROPOFOL (N/A) - 10am EGD with Dr. Henrene Pastor  as a surgical intervention.  The patient's history has been reviewed, patient examined, no change in status, stable for surgery.  I have reviewed the patient's chart and labs.  Questions were answered to the patient's satisfaction.     Kimberly Bruce

## 2019-03-22 NOTE — Op Note (Signed)
[facility] Patient Name: Kimberly Bruce Procedure Date: 03/22/2019 MRN: [id number] Attending MD: Docia Chuck. Henrene Pastor , MD Date of Birth: [birth year] CSN: [id number] Age: 48 Admit Type: Inpatient Procedure:                Upper GI endoscopy with control of bleeding; with                            biopsies Indications:              Melena. Patient with a history of peptic ulcer                            disease. Recent use of NSAIDs Providers:                Docia Chuck. Henrene Pastor, MD, Raynelle Bring, RN, Ladona Ridgel, Technician Referring MD:             [facility] Medicines:                Monitored Anesthesia Care Complications:            No immediate complications. Estimated Blood Loss:     Estimated blood loss: none. Procedure:                Pre-Anesthesia Assessment:                           - Prior to the procedure, a History and Physical                            was performed, and patient medications and                            allergies were reviewed. The patient's tolerance of                            previous anesthesia was also reviewed. The risks                            and benefits of the procedure and the sedation                            options and risks were discussed with the patient.                            All questions were answered, and informed consent                            was obtained. Prior Anticoagulants: The patient has                            taken no previous anticoagulant or antiplatelet  agents. ASA Grade Assessment: II - A patient with                            mild systemic disease. After reviewing the risks                            and benefits, the patient was deemed in                            satisfactory condition to undergo the procedure.                           After obtaining informed consent, the endoscope was                            passed  under direct vision. Throughout the                            procedure, the patient's blood pressure, pulse, and                            oxygen saturations were monitored continuously. The                            GIF-H190 ([id number]) Olympus gastroscope was                            introduced through the mouth, and advanced to the                            second part of duodenum. The upper GI endoscopy was                            accomplished without difficulty. The patient                            tolerated the procedure well. Scope In: Scope Out: Findings:      The esophagus was normal.      One non-bleeding cratered gastric ulcer with pigmented material was       found at the pylorus. The lesion was 20 mm in largest dimension. Area       was successfully injected with 1 mL of a 1:10,000 solution of       epinephrine for hemostasis. Coagulation for bleeding prevention using       bipolar probe was successful. Biopsies were taken with a cold forceps       for Helicobacter pylori testing using CLOtest.      The examined duodenum was normal.      The cardia and gastric fundus were normal on retroflexion. Impression:               1. Large pyloric channel ulcer with pigmentation                            status post endoscopic hemostatic therapy  2. Otherwise normal exam. Status post CLO biopsy. Moderate Sedation:      none Recommendation:           1. Continue IV PPI                           2. Full liquid diet                           3. Avoid all NSAIDs indefinitely                           4. Continue to monitor blood counts and stools.                            Would be okay to transfer out of the intensive care                            unit if you would like.                           GI will follow Procedure Code(s):        --- Professional ---                           [phone], Esophagogastroduodenoscopy, flexible,                             transoral; with biopsy, single or multiple Diagnosis Code(s):        --- Professional ---                           K25.9, Gastric ulcer, unspecified as acute or                            chronic, without hemorrhage or perforation                           K92.1, Melena (includes Hematochezia) CPT copyright 2019 American Medical Association. All rights reserved. The codes documented in this report are preliminary and upon coder review may  be revised to meet current compliance requirements. Docia Chuck. Henrene Pastor, MD 03/22/2019 1:55:40 PM This report has been signed electronically. Number of Addenda: 0

## 2019-03-23 ENCOUNTER — Inpatient Hospital Stay (HOSPITAL_COMMUNITY): Payer: 59

## 2019-03-23 DIAGNOSIS — R1031 Right lower quadrant pain: Secondary | ICD-10-CM

## 2019-03-23 DIAGNOSIS — R109 Unspecified abdominal pain: Secondary | ICD-10-CM | POA: Diagnosis present

## 2019-03-23 DIAGNOSIS — R000 Tachycardia, unspecified: Secondary | ICD-10-CM | POA: Diagnosis present

## 2019-03-23 DIAGNOSIS — K253 Acute gastric ulcer without hemorrhage or perforation: Secondary | ICD-10-CM

## 2019-03-23 LAB — TYPE AND SCREEN
ABO/RH(D): O POS
Antibody Screen: NEGATIVE
Unit division: 0
Unit division: 0

## 2019-03-23 LAB — BASIC METABOLIC PANEL
Anion gap: 9 (ref 5–15)
BUN: <5 mg/dL — ABNORMAL LOW (ref 6–20)
CO2: 22 mmol/L (ref 22–32)
Calcium: 8.3 mg/dL — ABNORMAL LOW (ref 8.9–10.3)
Chloride: 109 mmol/L (ref 98–111)
Creatinine, Ser: 0.54 mg/dL (ref 0.44–1.00)
GFR calc Af Amer: >60 mL/min (ref >60)
GFR calc non Af Amer: >60 mL/min (ref >60)
Glucose, Bld: 89 mg/dL (ref 70–99)
Potassium: 3.3 mmol/L — ABNORMAL LOW (ref 3.5–5.1)
Sodium: 140 mmol/L (ref 135–145)

## 2019-03-23 LAB — HEMOGLOBIN AND HEMATOCRIT, BLOOD
HCT: 36.4 % (ref 36.0–46.0)
HCT: 28.4 % — ABNORMAL LOW (ref 36.0–46.0)
HCT: 30.3 % — ABNORMAL LOW (ref 36.0–46.0)
Hemoglobin: 11.6 g/dL — ABNORMAL LOW (ref 12.0–15.0)
Hemoglobin: 8.9 g/dL — ABNORMAL LOW (ref 12.0–15.0)
Hemoglobin: 10.1 g/dL — ABNORMAL LOW (ref 12.0–15.0)

## 2019-03-23 LAB — BPAM RBC
Blood Product Expiration Date: 202006242359
Blood Product Expiration Date: 202006242359
ISSUE DATE / TIME: 202005291952
ISSUE DATE / TIME: 202005292306
Unit Type and Rh: 5100
Unit Type and Rh: 5100

## 2019-03-23 LAB — PTH, INTACT AND CALCIUM
Calcium, Total (PTH): 8.1 mg/dL — ABNORMAL LOW (ref 8.7–10.2)
PTH: 37 pg/mL (ref 15–65)

## 2019-03-23 LAB — CLOTEST (H. PYLORI), BIOPSY: Helicobacter screen: NEGATIVE

## 2019-03-23 LAB — MAGNESIUM: Magnesium: 1.7 mg/dL (ref 1.7–2.4)

## 2019-03-23 LAB — HIV ANTIBODY (ROUTINE TESTING W REFLEX): HIV Screen 4th Generation wRfx: NONREACTIVE

## 2019-03-23 MED ORDER — TRAMADOL HCL 50 MG PO TABS
100.0000 mg | ORAL_TABLET | Freq: Four times a day (QID) | ORAL | Status: DC | PRN
  Administered 2019-03-23 (×2): 100 mg via ORAL
  Filled 2019-03-23 (×2): qty 2

## 2019-03-23 MED ORDER — SODIUM CHLORIDE (PF) 0.9 % IJ SOLN
INTRAMUSCULAR | Status: AC
  Administered 2019-03-23: 16:00:00
  Filled 2019-03-23: qty 50

## 2019-03-23 MED ORDER — MAGNESIUM SULFATE 4 GM/100ML IV SOLN
4.0000 g | Freq: Once | INTRAVENOUS | Status: AC
  Administered 2019-03-23: 4 g via INTRAVENOUS
  Filled 2019-03-23: qty 100

## 2019-03-23 MED ORDER — POTASSIUM CHLORIDE CRYS ER 20 MEQ PO TBCR
40.0000 meq | EXTENDED_RELEASE_TABLET | Freq: Once | ORAL | Status: AC
  Administered 2019-03-23: 40 meq via ORAL
  Filled 2019-03-23: qty 2

## 2019-03-23 MED ORDER — IOHEXOL 300 MG/ML  SOLN
100.0000 mL | Freq: Once | INTRAMUSCULAR | Status: AC | PRN
  Administered 2019-03-23: 100 mL via INTRAVENOUS

## 2019-03-23 MED ORDER — IOHEXOL 300 MG/ML  SOLN
30.0000 mL | Freq: Once | INTRAMUSCULAR | Status: AC | PRN
  Administered 2019-03-23: 30 mL via ORAL
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
## 2019-03-23 NOTE — Progress Notes (Signed)
HISTORY OF PRESENT ILLNESS:  Kimberly Bruce is a 48 y.o. female with a history of peptic ulcer disease who presented 2 days ago with an acute upper GI bleed.  She was started on IV PPI and transfused 2 units of blood.  She underwent upper endoscopy yesterday and was found to have a giant pyloric channel ulcer with pigmentation but no active bleeding which was provided endoscopic therapy.  She has been stable overnight without complaints.  No bowel movements.  Her hemoglobin is stable at 8.9 (2 previous values were spuriously elevated).  REVIEW OF SYSTEMS:  All non-GI ROS negative unless otherwise stated in the HPI  Past Medical History:  Diagnosis Date  . Anemia    since teenage years  . Anxiety 1996  . Blood transfusion without reported diagnosis   . Insomnia   . Ovarian cyst     Past Surgical History:  Procedure Laterality Date  . CESAREAN SECTION     x2   . ESOPHAGOGASTRODUODENOSCOPY N/A 02/21/2016   Procedure: ESOPHAGOGASTRODUODENOSCOPY (EGD);  Surgeon: Mauri Pole, MD;  Location[facility] END[facility]COPY;  Service: Endoscopy;  Laterality: N/A;  . LAPAROSCOPY N/A 01/30/2014   Procedure: LAPAROSCOPY DIAGNOSTIC;  Surgeon: Lovenia Kim, MD;  Location: [facility];  Service: Gynecology;  Laterality: N/A;  . ROBOTIC ASSISTED LAPAROSCOPIC LYSIS OF ADHESION N/A 01/30/2014   Procedure: Possible ROBOTIC ASSISTED LAPAROSCOPIC LYSIS OF ADHESION WITH LEFT SALPINGO OOPHORECTOMY;  Surgeon: Lovenia Kim, MD;  Location: [facility];  Service: Gynecology;  Laterality: N/A;  . WISDOM TOOTH EXTRACTION      Social History Carisma Troupe  reports that she has never smoked. She has never used smokeless tobacco. She reports that she does not drink alcohol or use drugs.  family history includes Alcohol abuse in her maternal grandmother; Aneurysm in her father; Breast cancer in her maternal aunt; Cancer in her maternal uncle; Heart disease in her paternal grandmother; Hepatitis C in her father; Lung cancer (age of  onset: 22) in her sister; Other in her paternal grandfather.  Allergies  Allergen Reactions  . Citalopram Nausea And Vomiting       PHYSICAL EXAMINATION: Vital signs: BP 118/64 (BP Location: Right Arm)   Pulse 89   Temp 98.4 F (36.9 C) (Oral)   Resp 17   LMP 04/12/2017   SpO2 98%   Constitutional: generally well-appearing, no acute distress Psychiatric: alert and oriented x3, cooperative Eyes: anicteric, conjunctiva pale Cardiovascular: heart regular rate and rhythm, no murmur Lungs: clear to auscultation bilaterally Abdomen: soft, nontender, nondistended, no obvious ascites, no peritoneal signs, normal bowel sounds, no organomegaly Rectal: Omitted Extremities: no clubbing, cyanosis, or lower extremity edema bilaterally Skin: no lesions on visible extremities Neuro: No focal deficits.   ASSESSMENT:  1.  Acute upper GI bleed secondary to large pyloric channel ulcer status post endoscopic hemostatic therapy.  Patient has remained stable without evidence of rebleeding 2.  Cause for bleeding likely NSAIDs.  Helicobacter pylori testing pending   PLAN:  1.  Advance diet to regular 2.  Okay to transfer out of ICU 3.  Ambulate 4.  Avoid all NSAIDs indefinitely 5.  Convert IV PPI to pantoprazole 40 mg p.o. twice daily.  She should be discharged on pantoprazole 40 mg p.o. twice daily.  Prescription should have multiple refills 6.  If doing well overnight the patient could be discharged in the morning 7.  We will follow-up Helicobacter pylori testing 8.  Recommend daily iron supplement 9.  We will arrange follow-up with  her primary GI provider Dr. Silverio Decamp.  The patient will likely need relook endoscopy in about 8-10 weeks to document ulcer healing  Please call for problems or questions.  Thank you  Docia Chuck. Geri Seminole., M.D. [facility] Division of Gastroenterology

## 2019-03-24 ENCOUNTER — Encounter (HOSPITAL_COMMUNITY): Payer: Self-pay | Admitting: Internal Medicine

## 2019-03-24 LAB — HEMOGLOBIN AND HEMATOCRIT, BLOOD
HCT: 28.4 % — ABNORMAL LOW (ref 36.0–46.0)
HCT: 29.1 % — ABNORMAL LOW (ref 36.0–46.0)
Hemoglobin: 9.4 g/dL — ABNORMAL LOW (ref 12.0–15.0)
Hemoglobin: 9.5 g/dL — ABNORMAL LOW (ref 12.0–15.0)

## 2019-03-24 LAB — BASIC METABOLIC PANEL
Anion gap: 5 (ref 5–15)
BUN: <5 mg/dL — ABNORMAL LOW (ref 6–20)
CO2: 24 mmol/L (ref 22–32)
Calcium: 8.2 mg/dL — ABNORMAL LOW (ref 8.9–10.3)
Chloride: 108 mmol/L (ref 98–111)
Creatinine, Ser: 0.58 mg/dL (ref 0.44–1.00)
GFR calc Af Amer: >60 mL/min (ref >60)
GFR calc non Af Amer: >60 mL/min (ref >60)
Glucose, Bld: 97 mg/dL (ref 70–99)
Potassium: 4.1 mmol/L (ref 3.5–5.1)
Sodium: 137 mmol/L (ref 135–145)

## 2019-03-24 LAB — MAGNESIUM: Magnesium: 2.5 mg/dL — ABNORMAL HIGH (ref 1.7–2.4)

## 2019-03-24 LAB — VITAMIN D 25 HYDROXY (VIT D DEFICIENCY, FRACTURES): Vit D, 25-Hydroxy: 24.9 ng/mL — ABNORMAL LOW (ref 30.0–100.0)

## 2019-03-24 MED ORDER — PANTOPRAZOLE SODIUM 40 MG PO TBEC
40.0000 mg | DELAYED_RELEASE_TABLET | Freq: Two times a day (BID) | ORAL | Status: DC
  Administered 2019-03-24 – 2019-03-25 (×2): 40 mg via ORAL
  Filled 2019-03-24 (×2): qty 1
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
## 2019-03-25 LAB — BASIC METABOLIC PANEL
Anion gap: 8 (ref 5–15)
BUN: 5 mg/dL — ABNORMAL LOW (ref 6–20)
CO2: 25 mmol/L (ref 22–32)
Calcium: 8.9 mg/dL (ref 8.9–10.3)
Chloride: 105 mmol/L (ref 98–111)
Creatinine, Ser: 0.57 mg/dL (ref 0.44–1.00)
GFR calc Af Amer: >60 mL/min (ref >60)
GFR calc non Af Amer: >60 mL/min (ref >60)
Glucose, Bld: 100 mg/dL — ABNORMAL HIGH (ref 70–99)
Potassium: 3.9 mmol/L (ref 3.5–5.1)
Sodium: 138 mmol/L (ref 135–145)

## 2019-03-25 LAB — HEMOGLOBIN AND HEMATOCRIT, BLOOD
HCT: 31.6 % — ABNORMAL LOW (ref 36.0–46.0)
Hemoglobin: 10.2 g/dL — ABNORMAL LOW (ref 12.0–15.0)

## 2019-03-25 MED ORDER — TRAMADOL HCL 50 MG PO TABS: 100.0000 mg | ORAL_TABLET | Freq: Four times a day (QID) | ORAL | 0 refills | Status: DC | PRN

## 2019-03-25 MED ORDER — POLYSACCHARIDE IRON COMPLEX 150 MG PO CAPS: 150.0000 mg | ORAL_CAPSULE | Freq: Every day | ORAL | Status: DC

## 2019-03-25 MED ORDER — PANTOPRAZOLE SODIUM 40 MG PO TBEC: 40.0000 mg | DELAYED_RELEASE_TABLET | Freq: Two times a day (BID) | ORAL | 4 refills | Status: DC
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
## 2019-04-10 ENCOUNTER — Telehealth: Payer: Self-pay | Admitting: *Deleted

## 2019-04-10 ENCOUNTER — Encounter: Payer: Self-pay | Admitting: Gastroenterology

## 2019-04-10 ENCOUNTER — Ambulatory Visit (INDEPENDENT_AMBULATORY_CARE_PROVIDER_SITE_OTHER): Payer: 59 | Admitting: Gastroenterology

## 2019-04-10 VITALS — Ht 59.0 in | Wt 150.0 lb

## 2019-04-10 DIAGNOSIS — R109 Unspecified abdominal pain: Secondary | ICD-10-CM

## 2019-04-10 DIAGNOSIS — R1012 Left upper quadrant pain: Secondary | ICD-10-CM

## 2019-04-10 DIAGNOSIS — K279 Peptic ulcer, site unspecified, unspecified as acute or chronic, without hemorrhage or perforation: Secondary | ICD-10-CM | POA: Diagnosis not present

## 2019-04-10 DIAGNOSIS — K250 Acute gastric ulcer with hemorrhage: Secondary | ICD-10-CM | POA: Diagnosis not present

## 2019-04-10 DIAGNOSIS — K254 Chronic or unspecified gastric ulcer with hemorrhage: Secondary | ICD-10-CM

## 2019-04-10 DIAGNOSIS — D508 Other iron deficiency anemias: Secondary | ICD-10-CM

## 2019-04-10 DIAGNOSIS — Z791 Long term (current) use of non-steroidal anti-inflammatories (NSAID): Secondary | ICD-10-CM

## 2019-04-10 DIAGNOSIS — Z8719 Personal history of other diseases of the digestive system: Secondary | ICD-10-CM

## 2019-04-10 DIAGNOSIS — D500 Iron deficiency anemia secondary to blood loss (chronic): Secondary | ICD-10-CM

## 2019-04-10 MED ORDER — OMEPRAZOLE 40 MG PO CPDR: 40.0000 mg | DELAYED_RELEASE_CAPSULE | Freq: Two times a day (BID) | ORAL | 3 refills | Status: DC

## 2019-04-10 MED ORDER — SUCRALFATE 1 G PO TABS: 1.0000 g | ORAL_TABLET | Freq: Three times a day (TID) | ORAL | 1 refills | Status: DC

## 2019-04-10 NOTE — Telephone Encounter (Signed)
Mailed instructions to patient today

## 2019-04-10 NOTE — Telephone Encounter (Signed)
If patient calls back she can be scheduled with [facility] and I will mail her her instructions... She also needs labs drawn  Orders are in

## 2019-04-10 NOTE — Patient Instructions (Addendum)
Switch to omeprazole 40 mg twice daily, 30 minutes before breakfast and dinner, 90-day supply with refills for a year  We have sent this prescription to your pharmacy   Carafate 1 g 3 times daily with meals and at bedtime as needed, 120 tablets with 1 refill  Avoid NSAIDs  Check hemoglobin and hematocrit ( Go to the basement for labs today) Carey level   Continue oral iron  Schedule EGD in 2 to 3 weeks for follow-up and document ulcer healing ( We have contacted you to schedule and Left you a message)   Follow-up virtual office visit in 2 to 3 months  I appreciate the  opportunity to care for you  Thank You   Harl Bowie , MD

## 2019-04-10 NOTE — Progress Notes (Signed)
Kimberly Bruce    [id number]    Jan 14, 1971  Primary Care Physician:Patient, No Pcp Per  Referring Physician: No referring provider defined for this encounter.  This service was provided via audio only telemedicine (telephone) due to [place] 19 pandemic.  Patient location: Home Provider location: Office Used 2 patient identifiers to confirm the correct person. Explained the limitations in evaluation and management via telemedicine. Patient is aware of potential medical charges for this visit.  Patient consented to this virtual visit.  The persons participating in this telemedicine service were myself and the patient  Interactive audio and video telecommunications were attempted between this provider and patient, however failed, due to patient having technical difficulties OR patient did not have access to video capability. We continued and completed visit with audio only.  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 pylori with Pylera, was recently hospitalized earlier this month with recurrent GI hemorrhage secondary to bleeding gastric ulcer. CLOtest negative for H. Pylori She continues to have upper abdominal discomfort with meals and her appetite is decreased.  She is avoiding NSAIDs Denies any vomiting, melena or blood per rectum.  Weight is stable.   Outpatient Encounter Medications as of 04/10/2019  Medication Sig  . OVER THE COUNTER MEDICATION Take 1 tablet by mouth at bedtime. OTC sleeping pill  . pantoprazole (PROTONIX) 40 MG tablet Take 1 tablet (40 mg total) by mouth 2 (two) times daily.  Marland Kitchen zolpidem (AMBIEN) 5 MG tablet Take 1 tablet (5 mg total) by mouth at bedtime as needed for sleep.  . iron polysaccharides (NU-IRON) 150 MG capsule Take 1 capsule (150 mg total) by mouth daily. (Patient not taking:  Reported on 04/10/2019)  . [DISCONTINUED] bismuth-metronidazole-tetracycline (PYLERA) 140-125-125 MG capsule Take 3 capsules by mouth 4 (four) times daily -  before meals and at bedtime. (Patient not taking: Reported on 02/24/2019)  . [DISCONTINUED] traMADol (ULTRAM) 50 MG tablet Take 2 tablets (100 mg total) by mouth every 6 (six) hours as needed for severe pain. (Patient not taking: Reported on 04/10/2019)   No facility-administered encounter medications on file as of 04/10/2019.     Allergies as of 04/10/2019 - Review Complete 03/22/2019  Allergen Reaction Noted  . Citalopram Nausea And Vomiting 04/07/2013    Past Medical History:  Diagnosis Date  . Anemia    since teenage years  . Anxiety 1996  . Blood transfusion without reported diagnosis   . Insomnia   . Ovarian cyst     Past Surgical History:  Procedure Laterality Date  . CESAREAN SECTION     x2   . ESOPHAGOGASTRODUODENOSCOPY N/A 02/21/2016   Procedure: ESOPHAGOGASTRODUODENOSCOPY (EGD);  Surgeon: Mauri Pole, MD;  Location: [facility] ENDOSCOPY;  Service: Endoscopy;  Laterality: N/A;  . ESOPHAGOGASTRODUODENOSCOPY (EGD) WITH PROPOFOL N/A 03/22/2019   Procedure: ESOPHAGOGASTRODUODENOSCOPY (EGD) WITH PROPOFOL;  Surgeon: Irene Shipper, MD;  Location: [facility] ENDOSCOPY;  Service: Endoscopy;  Laterality: N/A;  10am EGD with Dr. Henrene Pastor   . HEMOSTASIS CONTROL  03/22/2019   Procedure: HEMOSTASIS CONTROL;  Surgeon: Irene Shipper, MD;  Location: [facility] ENDOSCOPY;  Service: Endoscopy;;  . HOT HEMOSTASIS N/A 03/22/2019   Procedure: HOT HEMOSTASIS (ARGON PLASMA COAGULATION/BICAP);  Surgeon: Irene Shipper, MD;  Location: [facility];  Service: Endoscopy;  Laterality: N/A;  . LAPAROSCOPY N/A  01/30/2014   Procedure: LAPAROSCOPY DIAGNOSTIC;  Surgeon: Lovenia Kim, MD;  Location: [facility];  Service: Gynecology;  Laterality: N/A;  . ROBOTIC ASSISTED LAPAROSCOPIC LYSIS OF ADHESION N/A 01/30/2014   Procedure: Possible ROBOTIC ASSISTED LAPAROSCOPIC LYSIS OF  ADHESION WITH LEFT SALPINGO OOPHORECTOMY;  Surgeon: Lovenia Kim, MD;  Location: [facility];  Service: Gynecology;  Laterality: N/A;  . WISDOM TOOTH EXTRACTION      Family History  Problem Relation Age of Onset  . Aneurysm Father        brain  . Hepatitis C Father   . Breast cancer Maternal Aunt        breast  . Cancer Maternal Uncle        prostate?  Marland Kitchen Heart disease Paternal Grandmother   . Alcohol abuse Maternal Grandmother   . Other Paternal Grandfather        unknown  . Lung cancer Sister 40       died  . Colon cancer Neg Hx   . Esophageal cancer Neg Hx   . Stomach cancer Neg Hx   . Rectal cancer Neg Hx     Social History   Socioeconomic History  . Marital status: Single    Spouse name: Not on file  . Number of children: Not on file  . Years of education: Not on file  . Highest education level: Not on file  Occupational History  . Not on file  Social Needs  . Financial resource strain: Not on file  . Food insecurity    Worry: Not on file    Inability: Not on file  . Transportation needs    Medical: Not on file    Non-medical: Not on file  Tobacco Use  . Smoking status: Never Smoker  . Smokeless tobacco: Never Used  Substance and Sexual Activity  . Alcohol use: No    Alcohol/week: 0.0 standard drinks    Comment: occasionally  . Drug use: No  . Sexual activity: Yes    Birth control/protection: None  Lifestyle  . Physical activity    Days per week: Not on file    Minutes per session: Not on file  . Stress: Not on file  Relationships  . Social Herbalist on phone: Not on file    Gets together: Not on file    Attends religious service: Not on file    Active member of club or organization: Not on file    Attends meetings of clubs or organizations: Not on file    Relationship status: Not on file  . Intimate partner violence    Fear of current or ex partner: Not on file    Emotionally abused: Not on file    Physically abused: Not on file     Forced sexual activity: Not on file  Other Topics Concern  . Not on file  Social History Narrative   Her 60yo daughter [name] lives with her, [name], works as Employment IT sales professional at [employer], exercising 5 days per week.  Likes to do kickboxing.  In relationship 2 years.       Review of systems: Review of Systems as per HPI All other systems reviewed and are negative.   Physical Exam: Vitals were not taken and physical exam was not performed during this virtual visit.  Data Reviewed:  Reviewed labs, radiology imaging, old records and pertinent past GI work up   Assessment and Plan/Recommendations:  48 year old female with large pyloric  channel ulcer, discharged after recent hospitalization for acute GI hemorrhage from bleeding gastric ulcer on June 2. History of H. pylori status post therapy, recent CLOtest negative for H. Pylori  Hemoglobin 10.2 on discharge We will recheck hemoglobin and hematocrit  Continue oral iron, ferrous sulfate 325 mg 2-3 times daily with meals Avoid NSAIDs  Switch to omeprazole 40 mg twice daily, 30 minutes before breakfast and dinner Carafate 1 g 3 times daily with meals and at bedtime as needed  Repeat EGD to document ulcer healing The risks and benefits as well as alternatives of endoscopic procedure(s) have been discussed and reviewed. All questions answered. The patient agrees to proceed.     Damaris Hippo , MD   CC: No ref. provider found

## 2019-04-10 NOTE — Telephone Encounter (Signed)
Pt is scheduled for EGD 7/10 at 3:00 PM.

## 2019-04-14 ENCOUNTER — Encounter: Payer: Self-pay | Admitting: Gastroenterology

## 2019-04-28 ENCOUNTER — Other Ambulatory Visit (INDEPENDENT_AMBULATORY_CARE_PROVIDER_SITE_OTHER): Payer: 59

## 2019-04-28 DIAGNOSIS — R1012 Left upper quadrant pain: Secondary | ICD-10-CM

## 2019-04-28 DIAGNOSIS — K250 Acute gastric ulcer with hemorrhage: Secondary | ICD-10-CM | POA: Diagnosis not present

## 2019-04-28 DIAGNOSIS — K279 Peptic ulcer, site unspecified, unspecified as acute or chronic, without hemorrhage or perforation: Secondary | ICD-10-CM

## 2019-04-28 DIAGNOSIS — D500 Iron deficiency anemia secondary to blood loss (chronic): Secondary | ICD-10-CM | POA: Diagnosis not present

## 2019-04-28 LAB — HEMATOCRIT: HCT: 35.3 % — ABNORMAL LOW (ref 36.0–46.0)

## 2019-04-28 LAB — HEMOGLOBIN: Hemoglobin: 11.5 g/dL — ABNORMAL LOW (ref 12.0–15.0)

## 2019-05-01 ENCOUNTER — Telehealth: Payer: Self-pay | Admitting: Gastroenterology

## 2019-05-01 NOTE — Telephone Encounter (Signed)
Spoke with patient regarding Covid-19 screening questions Covid-19 Screening Questions:  Do you now or have you had a fever in the last 14 days? no  Do you have any respiratory symptoms of shortness of breath or cough now or in the last 14 days? no  Do you have any family members or close contacts with diagnosed or suspected Covid-19 in the past 14 days? no  Have you been tested for Covid-19 and found to be positive? Yes, negative  Pt made aware of that care partner may wait in the car or come up to the lobby during the procedure but will need to provide their own mask.

## 2019-05-02 ENCOUNTER — Other Ambulatory Visit: Payer: Self-pay

## 2019-05-02 ENCOUNTER — Encounter: Payer: Self-pay | Admitting: Gastroenterology

## 2019-05-02 ENCOUNTER — Ambulatory Visit (AMBULATORY_SURGERY_CENTER): Payer: 59 | Admitting: Gastroenterology

## 2019-05-02 VITALS — BP 116/60 | HR 87 | Temp 98.9°F | Resp 20 | Ht 59.0 in | Wt 150.0 lb

## 2019-05-02 DIAGNOSIS — K254 Chronic or unspecified gastric ulcer with hemorrhage: Secondary | ICD-10-CM

## 2019-05-02 DIAGNOSIS — K2970 Gastritis, unspecified, without bleeding: Secondary | ICD-10-CM

## 2019-05-02 DIAGNOSIS — K3189 Other diseases of stomach and duodenum: Secondary | ICD-10-CM

## 2019-05-02 MED ORDER — PANTOPRAZOLE SODIUM 40 MG PO TBEC: 40.0000 mg | DELAYED_RELEASE_TABLET | Freq: Two times a day (BID) | ORAL | 3 refills | Status: DC

## 2019-05-02 MED ORDER — SODIUM CHLORIDE 0.9 % IV SOLN: 500.0000 mL | Freq: Once | INTRAVENOUS | Status: DC

## 2019-05-02 NOTE — Op Note (Addendum)
[facility] Patient Name: Kimberly Bruce Procedure Date: 05/02/2019 2:34 PM MRN: [id number] Endoscopist: Mauri Pole , MD Age: 48 Referring MD:  Date of Birth: [birth year] Gender: Female Account #: [id number] Procedure:                Upper GI endoscopy Indications:              Follow-up of chronic gastric ulcer Medicines:                Monitored Anesthesia Care Procedure:                Pre-Anesthesia Assessment:                           - Prior to the procedure, a History and Physical                            was performed, and patient medications and                            allergies were reviewed. The patient's tolerance of                            previous anesthesia was also reviewed. The risks                            and benefits of the procedure and the sedation                            options and risks were discussed with the patient.                            All questions were answered, and informed consent                            was obtained. Prior Anticoagulants: The patient has                            taken no previous anticoagulant or antiplatelet                            agents. ASA Grade Assessment: II - A patient with                            mild systemic disease. After reviewing the risks                            and benefits, the patient was deemed in                            satisfactory condition to undergo the procedure.                           After obtaining informed consent, the endoscope was  passed under direct vision. Throughout the                            procedure, the patient's blood pressure, pulse, and                            oxygen saturations were monitored continuously. The                            Endoscope was introduced through the mouth, and                            advanced to the second part of duodenum. The upper                            GI endoscopy  was accomplished without difficulty.                            The patient tolerated the procedure well.                            Photographs were not saved due to technical error                            with provation Scope In: Scope Out: Findings:                 The Z-line was regular and was found 35 cm from the                            incisors.                           One non-bleeding cratered gastric ulcer with no                            stigmata of bleeding was found at the pylorus. The                            lesion was 6 mm in largest dimension. Biopsies were                            taken with a cold forceps for histology.                           Diffuse mild inflammation characterized by                            congestion (edema) and erythema was found in the                            entire examined stomach. Biopsies were taken with a  cold forceps for Helicobacter pylori testing.                           The examined duodenum was normal. Complications:            No immediate complications. Estimated Blood Loss:     Estimated blood loss was minimal. Impression:               - Z-line regular, 35 cm from the incisors.                           - Non-bleeding gastric ulcer with no stigmata of                            bleeding. Biopsied.                           - Gastritis. Biopsied.                           - Normal examined duodenum. Recommendation:           - Patient has a contact number available for                            emergencies. The signs and symptoms of potential                            delayed complications were discussed with the                            patient. Return to normal activities tomorrow.                            Written discharge instructions were provided to the                            patient.                           - Resume previous diet.                           -  Continue present medications.                           - Await pathology results.                           - No aspirin, ibuprofen, naproxen, or other                            non-steroidal anti-inflammatory drugs.                           - Use Protonix (pantoprazole) 40 mg PO BID for 1  year.                           - Return to GI office in 6 months. Mauri Pole, MD 05/02/2019 2:53:10 PM This report has been signed electronically.

## 2019-05-02 NOTE — Progress Notes (Signed)
PT taken to PACU. Monitors in place. VSS. Report given to RN. 

## 2019-05-02 NOTE — Patient Instructions (Signed)
GASTRITIS.  BIOPSIED.  NO ASPIRIN, IBUPROFEN, NAPROXEN, OR OTHER NSAIDS. USE PROTONIX (pantoprazole) 40mg  BY MOUTH TWICE EVERYDAY FOR 1 YEAR. Return to GI office in 6 months (Dr Woodward Ku nurse will call you from the office to schedule this).    YOU HAD AN ENDOSCOPIC PROCEDURE TODAY AT [facility]:   Refer to the procedure report that was given to you for any specific questions about what was found during the examination.  If the procedure report does not answer your questions, please call your gastroenterologist to clarify.  If you requested that your care partner not be given the details of your procedure findings, then the procedure report has been included in a sealed envelope for you to review at your convenience later.  YOU SHOULD EXPECT: Some feelings of bloating in the abdomen. Passage of more gas than usual.  Walking can help get rid of the air that was put into your GI tract during the procedure and reduce the bloating. If you had a lower endoscopy (such as a colonoscopy or flexible sigmoidoscopy) you may notice spotting of blood in your stool or on the toilet paper. If you underwent a bowel prep for your procedure, you may not have a normal bowel movement for a few days.  Please Note:  You might notice some irritation and congestion in your nose or some drainage.  This is from the oxygen used during your procedure.  There is no need for concern and it should clear up in a day or so.  SYMPTOMS TO REPORT IMMEDIATELY:    Following upper endoscopy (EGD)  Vomiting of blood or coffee ground material  New chest pain or pain under the shoulder blades  Painful or persistently difficult swallowing  New shortness of breath  Fever of 100F or higher  Black, tarry-looking stools  For urgent or emergent issues, a gastroenterologist can be reached at any hour by calling [phone].   DIET:  We do recommend a small meal at first, but then you may proceed to your regular  diet.  Drink plenty of fluids but you should avoid alcoholic beverages for 24 hours.  ACTIVITY:  You should plan to take it easy for the rest of today and you should NOT DRIVE or use heavy machinery until tomorrow (because of the sedation medicines used during the test).    FOLLOW UP: Our staff will call the number listed on your records 48-72 hours following your procedure to check on you and address any questions or concerns that you may have regarding the information given to you following your procedure. If we do not reach you, we will leave a message.  We will attempt to reach you two times.  During this call, we will ask if you have developed any symptoms of COVID 19. If you develop any symptoms (ie: fever, flu-like symptoms, shortness of breath, cough etc.) before then, please call [phone].  If you test positive for Covid 19 in the 2 weeks post procedure, please call and report this information to [place].    If any biopsies were taken you will be contacted by phone or by letter within the next 1-3 weeks.  Please call us at [phone] if you have not heard about the biopsies in 3 weeks.    SIGNATURES/CONFIDENTIALITY: You and/or your care partner have signed paperwork which will be entered into your electronic medical record.  These signatures attest to the fact that that the information above on your After Visit Summary  has been reviewed and is understood.  Full responsibility of the confidentiality of this discharge information lies with you and/or your care-partner. 

## 2019-05-02 NOTE — Progress Notes (Signed)
Pt's states no medical or surgical changes since previsit or office visit.  Temp per [place], VS per CW

## 2019-05-02 NOTE — Progress Notes (Signed)
Called to room to assist during endoscopic procedure.  Patient ID and intended procedure confirmed with present staff. Received instructions for my participation in the procedure from the performing physician.  

## 2019-05-06 ENCOUNTER — Telehealth: Payer: Self-pay

## 2019-05-06 NOTE — Telephone Encounter (Signed)
  Follow up Call-  Call back number 05/02/2019  Post procedure Call Back phone  # [phone]  Permission to leave phone message Yes  Some recent data might be hidden     Patient questions:  Do you have a fever, pain , or abdominal swelling? No. Pain Score  0 *  Have you tolerated food without any problems? Yes.    Have you been able to return to your normal activities? Yes.    Do you have any questions about your discharge instructions: Diet   No. Medications  No. Follow up visit  No.  Do you have questions or concerns about your Care? No.  Actions: * If pain score is 4 or above: No action needed, pain <4. 1. Have you developed a fever since your procedure? no  2.   Have you had an respiratory symptoms (SOB or cough) since your procedure? no  3.   Have you tested positive for COVID 19 since your procedure no  4.   Have you had any family members/close contacts diagnosed with the COVID 19 since your procedure?  no   If yes to any of these questions please route to Joylene John, RN and Alphonsa Gin, Therapist, sports.

## 2019-05-13 ENCOUNTER — Encounter: Payer: Self-pay | Admitting: Gastroenterology

## 2019-05-28 ENCOUNTER — Telehealth: Payer: Self-pay | Admitting: Gastroenterology

## 2019-05-28 NOTE — Telephone Encounter (Signed)
Doc of Day Patient of Dr Silverio Decamp. She had an EGD on 05/02/19 follow up of GI bleed with ulcer. She notes that "pretty much since the EGD" she has more saliva. She has not been started on new medication. Denies dental pain or bad breath. Denies any sneezing, itching or other signs of allergy.  Please advise. Thanks

## 2019-05-28 NOTE — Telephone Encounter (Signed)
I'm not sure about the cause of her increase in saliva. It is not caused by the EGD. Recommend that she makes an office appt with Dr. Silverio Decamp to review and examine.

## 2019-07-07 ENCOUNTER — Other Ambulatory Visit: Payer: Self-pay | Admitting: Internal Medicine

## 2019-07-07 DIAGNOSIS — Z1231 Encounter for screening mammogram for malignant neoplasm of breast: Secondary | ICD-10-CM

## 2019-07-10 ENCOUNTER — Other Ambulatory Visit: Payer: Self-pay

## 2019-07-10 ENCOUNTER — Ambulatory Visit
Admission: RE | Admit: 2019-07-10 | Discharge: 2019-07-10 | Disposition: A | Discharge status: Home / Self Care | Payer: 59 | Source: Ambulatory Visit | Attending: Internal Medicine | Admitting: Internal Medicine

## 2019-07-10 DIAGNOSIS — Z1231 Encounter for screening mammogram for malignant neoplasm of breast: Secondary | ICD-10-CM

## 2019-07-17 ENCOUNTER — Other Ambulatory Visit: Payer: Self-pay | Admitting: Gastroenterology

## 2019-08-12 ENCOUNTER — Telehealth: Payer: Self-pay

## 2019-08-12 NOTE — Telephone Encounter (Signed)
Ov scheduled on 11/20 at 9:30am.

## 2019-08-12 NOTE — Telephone Encounter (Signed)
Patient had EGD 05/02/19 for follow up of gastric ulcer.  Called to the patient to schedule her 6 month follow up. No answer. Left a message on the voicemail to call back to be scheduled.

## 2019-09-11 ENCOUNTER — Other Ambulatory Visit: Payer: Self-pay | Admitting: *Deleted

## 2019-09-11 ENCOUNTER — Other Ambulatory Visit (INDEPENDENT_AMBULATORY_CARE_PROVIDER_SITE_OTHER): Payer: 59

## 2019-09-11 DIAGNOSIS — K254 Chronic or unspecified gastric ulcer with hemorrhage: Secondary | ICD-10-CM

## 2019-09-11 LAB — CBC WITH DIFFERENTIAL/PLATELET
Basophils Absolute: 0.1 10*3/uL (ref 0.0–0.1)
Basophils Relative: 1.2 % (ref 0.0–3.0)
Eosinophils Absolute: 0.1 10*3/uL (ref 0.0–0.7)
Eosinophils Relative: 1.3 % (ref 0.0–5.0)
HCT: 32.9 % — ABNORMAL LOW (ref 36.0–46.0)
Hemoglobin: 10.9 g/dL — ABNORMAL LOW (ref 12.0–15.0)
Lymphocytes Relative: 29.9 % (ref 12.0–46.0)
Lymphs Abs: 1.4 10*3/uL (ref 0.7–4.0)
MCHC: 33.2 g/dL (ref 30.0–36.0)
MCV: 93.3 fl (ref 78.0–100.0)
Monocytes Absolute: 0.3 10*3/uL (ref 0.1–1.0)
Monocytes Relative: 6.6 % (ref 3.0–12.0)
Neutro Abs: 2.8 10*3/uL (ref 1.4–7.7)
Neutrophils Relative %: 61 % (ref 43.0–77.0)
Platelets: 413 10*3/uL — ABNORMAL HIGH (ref 150.0–400.0)
RBC: 3.53 Mil/uL — ABNORMAL LOW (ref 3.87–5.11)
RDW: 16.4 % — ABNORMAL HIGH (ref 11.5–15.5)
WBC: 4.7 10*3/uL (ref 4.0–10.5)

## 2019-09-12 ENCOUNTER — Ambulatory Visit (INDEPENDENT_AMBULATORY_CARE_PROVIDER_SITE_OTHER): Payer: 59 | Admitting: Gastroenterology

## 2019-09-12 ENCOUNTER — Other Ambulatory Visit (INDEPENDENT_AMBULATORY_CARE_PROVIDER_SITE_OTHER): Payer: 59

## 2019-09-12 ENCOUNTER — Telehealth: Payer: Self-pay

## 2019-09-12 ENCOUNTER — Encounter: Payer: Self-pay | Admitting: Gastroenterology

## 2019-09-12 ENCOUNTER — Other Ambulatory Visit: Payer: Self-pay

## 2019-09-12 VITALS — BP 98/60 | HR 65 | Temp 97.1°F | Ht 59.0 in | Wt 143.0 lb

## 2019-09-12 DIAGNOSIS — Z1159 Encounter for screening for other viral diseases: Secondary | ICD-10-CM | POA: Diagnosis not present

## 2019-09-12 DIAGNOSIS — D500 Iron deficiency anemia secondary to blood loss (chronic): Secondary | ICD-10-CM

## 2019-09-12 DIAGNOSIS — Z8619 Personal history of other infectious and parasitic diseases: Secondary | ICD-10-CM | POA: Diagnosis not present

## 2019-09-12 DIAGNOSIS — D508 Other iron deficiency anemias: Secondary | ICD-10-CM

## 2019-09-12 DIAGNOSIS — R634 Abnormal weight loss: Secondary | ICD-10-CM

## 2019-09-12 DIAGNOSIS — K254 Chronic or unspecified gastric ulcer with hemorrhage: Secondary | ICD-10-CM | POA: Diagnosis not present

## 2019-09-12 DIAGNOSIS — K259 Gastric ulcer, unspecified as acute or chronic, without hemorrhage or perforation: Secondary | ICD-10-CM | POA: Diagnosis not present

## 2019-09-12 DIAGNOSIS — K117 Disturbances of salivary secretion: Secondary | ICD-10-CM

## 2019-09-12 LAB — IBC + FERRITIN
Ferritin: 23.6 ng/mL (ref 10.0–291.0)
Iron: 13 ug/dL — ABNORMAL LOW (ref 42–145)
Saturation Ratios: 3.8 % — ABNORMAL LOW (ref 20.0–50.0)
Transferrin: 242 mg/dL (ref 212.0–360.0)

## 2019-09-12 LAB — VITAMIN B12: Vitamin B-12: 225 pg/mL (ref 211–911)

## 2019-09-12 LAB — FOLATE: Folate: 9.3 ng/mL (ref >5.9)

## 2019-09-12 MED ORDER — DEXILANT 60 MG PO CPDR: 60.0000 mg | DELAYED_RELEASE_CAPSULE | Freq: Every day | ORAL | 3 refills | Status: DC

## 2019-09-12 NOTE — Progress Notes (Signed)
Kimberly Bruce    LB:[id number]    April 24, 1971  Primary Care Physician:Patient, No Pcp Per  Referring Physician: No referring provider defined for this encounter.   Chief complaint: Weight loss, GERD, excessive salivation  HPI: 48 year old female with history of chronic gastric ulcer secondary to NSAIDs and H. pylori May 2017, status post treatment for H. pylori with Pylera. She was hospitalized with with recurrent GI hemorrhage secondary to bleeding gastric ulcer in May 2020. She has not been taking any of her medications because she feels they are not helping and only making her symptoms worse.  She is having excessive saliva, has to spit throughout the day.  She feels full all the time.  Complain of decreased appetite and weight loss.  No change in bowel habits.  Denies melena or blood per rectum. She is taking oral iron intermittently, she works second shift and forgets to take it.  No family history of IBD or GI malignancy.  EGD May 02, 2019: Cratered gastric ulcer in prepyloric region of stomach and diffuse gastritis.  Biopsies showed chronic gastritis, negative for H. pylori, negative for intestinal metaplasia, dysplasia or malignancy.  EGD Mar 22, 2019: Cratered 20 mm ulcer pylorus, injected with epi and cauterized.  CLOtest negative for H. pylori  EGD March 28, 2016: Few gastric erosions, biopsies negative for H. pylori  EGD Feb 21, 2016: Multiple gastric ulcers, food and blood clot in the stomach.  Gastric ulcer with visible vessel treated with epinephrine, bipolar cautery and clips.  Reviewed recent Hemoglobin & Hematocrit     Component Value Date/Time   HGB 10.9 (L) 09/11/2019 1357   HCT 32.9 (L) 09/11/2019 1357     Outpatient Encounter Medications as of 09/12/2019  Medication Sig  . iron polysaccharides (NU-IRON) 150 MG capsule Take 1 capsule (150 mg total) by mouth daily. (Patient not taking: Reported on 04/10/2019)  . omeprazole (PRILOSEC) 40 MG capsule  Take 1 capsule (40 mg total) by mouth 2 (two) times daily. 49  Min before breakfast and dinner (Patient not taking: Reported on 05/02/2019)  . OVER THE COUNTER MEDICATION Take 1 tablet by mouth at bedtime. OTC sleeping pill  . pantoprazole (PROTONIX) 40 MG tablet Take 1 tablet (40 mg total) by mouth 2 (two) times daily.  . pantoprazole (PROTONIX) 40 MG tablet Take 1 tablet (40 mg total) by mouth 2 (two) times daily.  . sucralfate (CARAFATE) 1 g tablet TAKE 1 TABLET(1 GRAM) BY MOUTH FOUR TIMES DAILY WITH MEALS AND AT BEDTIME  . zolpidem (AMBIEN) 5 MG tablet Take 1 tablet (5 mg total) by mouth at bedtime as needed for sleep.   No facility-administered encounter medications on file as of 09/12/2019.     Allergies as of 09/12/2019 - Review Complete 05/02/2019  Allergen Reaction Noted  . Citalopram Nausea And Vomiting 04/07/2013    Past Medical History:  Diagnosis Date  . Anemia    since teenage years  . Anxiety 1996  . Blood transfusion without reported diagnosis   . Insomnia   . Ovarian cyst     Past Surgical History:  Procedure Laterality Date  . CESAREAN SECTION     x2   . ESOPHAGOGASTRODUODENOSCOPY N/A 02/21/2016   Procedure: ESOPHAGOGASTRODUODENOSCOPY (EGD);  Surgeon: Mauri Pole, MD;  Location: [facility];  Service: Endoscopy;  Laterality: N/A;  . ESOPHAGOGASTRODUODENOSCOPY (EGD) WITH PROPOFOL N/A 03/22/2019   Procedure: ESOPHAGOGASTRODUODENOSCOPY (EGD) WITH PROPOFOL;  Surgeon: Irene Shipper, MD;  Location: [facility];  Service: Endoscopy;  Laterality: N/A;  10am EGD with Dr. Henrene Pastor   . HEMOSTASIS CONTROL  03/22/2019   Procedure: HEMOSTASIS CONTROL;  Surgeon: Irene Shipper, MD;  Location: [facility];  Service: Endoscopy;;  . HOT HEMOSTASIS N/A 03/22/2019   Procedure: HOT HEMOSTASIS (ARGON PLASMA COAGULATION/BICAP);  Surgeon: Irene Shipper, MD;  Location: [facility];  Service: Endoscopy;  Laterality: N/A;  . LAPAROSCOPY N/A 01/30/2014   Procedure: LAPAROSCOPY DIAGNOSTIC;   Surgeon: Lovenia Kim, MD;  Location: [facility];  Service: Gynecology;  Laterality: N/A;  . ROBOTIC ASSISTED LAPAROSCOPIC LYSIS OF ADHESION N/A 01/30/2014   Procedure: Possible ROBOTIC ASSISTED LAPAROSCOPIC LYSIS OF ADHESION WITH LEFT SALPINGO OOPHORECTOMY;  Surgeon: Lovenia Kim, MD;  Location: [facility];  Service: Gynecology;  Laterality: N/A;  . WISDOM TOOTH EXTRACTION      Family History  Problem Relation Age of Onset  . Aneurysm Father        brain  . Hepatitis C Father   . Breast cancer Maternal Aunt        breast  . Cancer Maternal Uncle        prostate?  Marland Kitchen Heart disease Paternal Grandmother   . Alcohol abuse Maternal Grandmother   . Other Paternal Grandfather        unknown  . Lung cancer Sister 24       died  . Colon cancer Neg Hx   . Esophageal cancer Neg Hx   . Stomach cancer Neg Hx   . Rectal cancer Neg Hx     Social History   Socioeconomic History  . Marital status: Single    Spouse name: Not on file  . Number of children: Not on file  . Years of education: Not on file  . Highest education level: Not on file  Occupational History  . Not on file  Social Needs  . Financial resource strain: Not on file  . Food insecurity    Worry: Not on file    Inability: Not on file  . Transportation needs    Medical: Not on file    Non-medical: Not on file  Tobacco Use  . Smoking status: Never Smoker  . Smokeless tobacco: Never Used  Substance and Sexual Activity  . Alcohol use: No    Alcohol/week: 0.0 standard drinks    Comment: occasionally  . Drug use: No  . Sexual activity: Yes    Birth control/protection: None  Lifestyle  . Physical activity    Days per week: Not on file    Minutes per session: Not on file  . Stress: Not on file  Relationships  . Social Herbalist on phone: Not on file    Gets together: Not on file    Attends religious service: Not on file    Active member of club or organization: Not on file    Attends meetings of clubs  or organizations: Not on file    Relationship status: Not on file  . Intimate partner violence    Fear of current or ex partner: Not on file    Emotionally abused: Not on file    Physically abused: Not on file    Forced sexual activity: Not on file  Other Topics Concern  . Not on file  Social History Narrative   Her 41yo daughter [name] lives with her, [name], works as Employment IT sales professional at [employer], exercising 5 days per week.  Likes to  do kickboxing.  In relationship 2 years.       Review of systems: Review of Systems  Constitutional: Negative for fever and chills.  HENT: Negative.   Eyes: Negative for blurred vision.  Respiratory: Negative for cough, shortness of breath and wheezing.   Cardiovascular: Negative for chest pain and palpitations.  Gastrointestinal: as per HPI Genitourinary: Negative for dysuria, urgency, frequency and hematuria.  Musculoskeletal: Negative for myalgias, back pain and joint pain.  Skin: Negative for itching and rash.  Neurological: Negative for dizziness, tremors, focal weakness, seizures and loss of consciousness.  Endo/Heme/Allergies: Negative Psychiatric/Behavioral: Negative for depression, suicidal ideas and hallucinations.  All other systems reviewed and are negative.   Physical Exam: Vitals:   09/12/19 0946  Temp: (!) 97.1 F (36.2 C)   Body mass index is 28.88 kg/m. Gen:      No acute distress HEENT:  EOMI, sclera anicteric Neck:     No masses; no thyromegaly Lungs:    Clear to auscultation bilaterally; normal respiratory effort CV:         Regular rate and rhythm; no murmurs Abd:      + bowel sounds; soft, non-tender; no palpable masses, no distension Ext:    No edema; adequate peripheral perfusion Skin:      Warm and dry; no rash Neuro: alert and oriented x 3 Psych: normal mood and affect  Data Reviewed:  Reviewed labs, radiology imaging, old records and pertinent past GI work up    Assessment and Plan/Recommendations:  48 year old female with history of chronic nonhealing gastric ulcer and chronic anemia with complaints of decreased appetite, worsening reflux symptoms and significant weight loss >20 lbs in past 6 months History of H. pylori s/p treatment with confirmed eradication  Schedule for EGD and colonoscopy for further evaluation of anemia and weight loss The risks and benefits as well as alternatives of endoscopic procedure(s) have been discussed and reviewed. All questions answered. The patient agrees to proceed.  Check iron panel, B12 and folate Continue oral iron supplement  Start Dexilant 60 mg daily Antireflux measures Small frequent meals with high-protein diet  Return in 3 months or sooner if needed   K. Denzil Magnuson , MD    CC: No ref. provider found

## 2019-09-12 NOTE — Patient Instructions (Signed)
Go to the basement for labs today   You have been scheduled for an endoscopy and colonoscopy. Please follow the written instructions given to you at your visit today. Please pick up your prep supplies at the pharmacy within the next 1-3 days. If you use inhalers (even only as needed), please bring them with you on the day of your procedure.   Continue oral Iron  We will send Dexilant to your pharmacy  Follow up in 3 months  I appreciate the  opportunity to care for you  Thank You   Harl Bowie , MD

## 2019-09-15 ENCOUNTER — Other Ambulatory Visit: Payer: Self-pay | Admitting: Gastroenterology

## 2019-10-01 NOTE — Telephone Encounter (Signed)
error 

## 2019-10-03 ENCOUNTER — Ambulatory Visit: Payer: 59

## 2019-10-03 ENCOUNTER — Encounter: Payer: Self-pay | Admitting: Gastroenterology

## 2019-10-03 ENCOUNTER — Other Ambulatory Visit: Payer: Self-pay | Admitting: Gastroenterology

## 2019-10-03 DIAGNOSIS — Z1159 Encounter for screening for other viral diseases: Secondary | ICD-10-CM

## 2019-10-04 LAB — SARS CORONAVIRUS 2 (TAT 6-24 HRS): SARS Coronavirus 2: NEGATIVE

## 2019-10-06 ENCOUNTER — Telehealth: Payer: Self-pay | Admitting: Gastroenterology

## 2019-10-06 MED ORDER — NA SULFATE-K SULFATE-MG SULF 17.5-3.13-1.6 GM/177ML PO SOLN: ORAL | 0 refills | Status: DC

## 2019-10-06 NOTE — Telephone Encounter (Signed)
Called patient to inform that suprep was sent to her pharmacy

## 2019-10-07 ENCOUNTER — Encounter: Payer: Self-pay | Admitting: Gastroenterology

## 2019-10-07 ENCOUNTER — Ambulatory Visit (AMBULATORY_SURGERY_CENTER): Payer: 59 | Admitting: Gastroenterology

## 2019-10-07 ENCOUNTER — Other Ambulatory Visit: Payer: Self-pay

## 2019-10-07 VITALS — BP 128/69 | HR 94 | Temp 97.5°F | Ht 59.0 in | Wt 143.0 lb

## 2019-10-07 DIAGNOSIS — D500 Iron deficiency anemia secondary to blood loss (chronic): Secondary | ICD-10-CM

## 2019-10-07 NOTE — Progress Notes (Signed)
Temp-ch  V/s-dt  Unable to obtain I.V. pt. Stated she is a difficult stick,iv attempted via several staff members and pt. Decided to reschedule. Dr. Bari Edward came in to speak with pt. And offered to be done at hospital and pt. Stated she wants to reschedule at later time . Dr. Bari Edward informed pt that she would  have her office to arrange procedure at hospital and her office staff will be contact with her.

## 2019-10-07 NOTE — H&P (View-Only) (Signed)
Difficult IV, unable to obtain IV access despite multiple attempts. Patient didn't want to me try to do the procedure at the hospital endo unit later this afternoon based on their availability, she want to reschedule it to a different day.

## 2019-10-07 NOTE — Progress Notes (Signed)
Difficult IV, unable to obtain IV access despite multiple attempts. Patient didn't want to me try to do the procedure at the hospital endo unit later this afternoon based on their availability, she want to reschedule it to a different day.

## 2019-10-08 ENCOUNTER — Other Ambulatory Visit: Payer: Self-pay

## 2019-10-08 ENCOUNTER — Telehealth: Payer: Self-pay

## 2019-10-08 DIAGNOSIS — I878 Other specified disorders of veins: Secondary | ICD-10-CM

## 2019-10-08 DIAGNOSIS — K254 Chronic or unspecified gastric ulcer with hemorrhage: Secondary | ICD-10-CM

## 2019-10-08 DIAGNOSIS — R634 Abnormal weight loss: Secondary | ICD-10-CM

## 2019-10-08 MED ORDER — NA SULFATE-K SULFATE-MG SULF 17.5-3.13-1.6 GM/177ML PO SOLN: ORAL | 0 refills | Status: DC

## 2019-10-08 NOTE — Telephone Encounter (Signed)
-----   Message from Mauri Pole, MD sent at 10/07/2019 11:17 AM EST ----- Unable to get IV for this patient at [facility], will need reschedule procedure at hospital endo unit for EGD and colonoscopy, next available appointment. Thank you

## 2019-10-08 NOTE — Telephone Encounter (Signed)
Patient contacted. She agrees to Covid screening on 10/31/2019 and procedures on 11/04/19. Offered nurse visit for prep instructions. She declines. Instructions and written prescription mailed.

## 2019-10-29 ENCOUNTER — Other Ambulatory Visit: Payer: Self-pay

## 2019-10-31 ENCOUNTER — Ambulatory Visit: Payer: 59 | Attending: Internal Medicine

## 2019-10-31 ENCOUNTER — Other Ambulatory Visit (HOSPITAL_COMMUNITY)
Admission: RE | Admit: 2019-10-31 | Payer: 59 | Source: Ambulatory Visit | Attending: Gastroenterology | Admitting: Gastroenterology

## 2019-10-31 DIAGNOSIS — Z20822 Contact with and (suspected) exposure to covid-19: Secondary | ICD-10-CM

## 2019-11-02 LAB — NOVEL CORONAVIRUS, NAA: SARS-CoV-2, NAA: NOT DETECTED

## 2019-11-04 ENCOUNTER — Encounter (HOSPITAL_COMMUNITY): Payer: Self-pay | Admitting: Gastroenterology

## 2019-11-04 ENCOUNTER — Ambulatory Visit (HOSPITAL_COMMUNITY)
Admission: RE | Admit: 2019-11-04 | Discharge: 2019-11-04 | Disposition: A | Discharge status: Home / Self Care | Payer: 59 | Attending: Gastroenterology | Admitting: Gastroenterology

## 2019-11-04 ENCOUNTER — Telehealth: Payer: Self-pay | Admitting: Internal Medicine

## 2019-11-04 ENCOUNTER — Ambulatory Visit (HOSPITAL_COMMUNITY): Payer: 59 | Admitting: Anesthesiology

## 2019-11-04 ENCOUNTER — Other Ambulatory Visit: Payer: Self-pay

## 2019-11-04 ENCOUNTER — Encounter (HOSPITAL_COMMUNITY)
Admission: RE | Disposition: A | Discharge status: Home / Self Care | Payer: Self-pay | Source: Home / Self Care | Attending: Gastroenterology

## 2019-11-04 DIAGNOSIS — K648 Other hemorrhoids: Secondary | ICD-10-CM | POA: Insufficient documentation

## 2019-11-04 DIAGNOSIS — Z1211 Encounter for screening for malignant neoplasm of colon: Secondary | ICD-10-CM | POA: Diagnosis not present

## 2019-11-04 DIAGNOSIS — Z8719 Personal history of other diseases of the digestive system: Secondary | ICD-10-CM

## 2019-11-04 DIAGNOSIS — Z8711 Personal history of peptic ulcer disease: Secondary | ICD-10-CM

## 2019-11-04 DIAGNOSIS — K2950 Unspecified chronic gastritis without bleeding: Secondary | ICD-10-CM | POA: Insufficient documentation

## 2019-11-04 DIAGNOSIS — K449 Diaphragmatic hernia without obstruction or gangrene: Secondary | ICD-10-CM | POA: Diagnosis not present

## 2019-11-04 DIAGNOSIS — Z79899 Other long term (current) drug therapy: Secondary | ICD-10-CM | POA: Insufficient documentation

## 2019-11-04 DIAGNOSIS — Z888 Allergy status to other drugs, medicaments and biological substances status: Secondary | ICD-10-CM | POA: Diagnosis not present

## 2019-11-04 DIAGNOSIS — K2970 Gastritis, unspecified, without bleeding: Secondary | ICD-10-CM | POA: Diagnosis not present

## 2019-11-04 DIAGNOSIS — D509 Iron deficiency anemia, unspecified: Secondary | ICD-10-CM | POA: Insufficient documentation

## 2019-11-04 DIAGNOSIS — R634 Abnormal weight loss: Secondary | ICD-10-CM | POA: Diagnosis not present

## 2019-11-04 DIAGNOSIS — Z6830 Body mass index (BMI) 30.0-30.9, adult: Secondary | ICD-10-CM | POA: Diagnosis not present

## 2019-11-04 DIAGNOSIS — D500 Iron deficiency anemia secondary to blood loss (chronic): Secondary | ICD-10-CM

## 2019-11-04 DIAGNOSIS — K254 Chronic or unspecified gastric ulcer with hemorrhage: Secondary | ICD-10-CM

## 2019-11-04 HISTORY — PX: ESOPHAGOGASTRODUODENOSCOPY (EGD) WITH PROPOFOL: SHX5813

## 2019-11-04 HISTORY — PX: COLONOSCOPY WITH PROPOFOL: SHX5780

## 2019-11-04 HISTORY — PX: BIOPSY: SHX5522

## 2019-11-04 SURGERY — ESOPHAGOGASTRODUODENOSCOPY (EGD) WITH PROPOFOL
Anesthesia: Monitor Anesthesia Care

## 2019-11-04 MED ORDER — PROPOFOL 500 MG/50ML IV EMUL
INTRAVENOUS | Status: DC | PRN
  Administered 2019-11-04 (×3): 20 mg via INTRAVENOUS
  Administered 2019-11-04: 30 mg via INTRAVENOUS
  Administered 2019-11-04 (×2): 20 mg via INTRAVENOUS
  Administered 2019-11-04: 30 mg via INTRAVENOUS
  Administered 2019-11-04 (×2): 20 mg via INTRAVENOUS
  Administered 2019-11-04: 30 mg via INTRAVENOUS
  Administered 2019-11-04 (×5): 20 mg via INTRAVENOUS

## 2019-11-04 MED ORDER — PROPOFOL 500 MG/50ML IV EMUL
INTRAVENOUS | Status: DC | PRN
  Administered 2019-11-04: 135 ug/kg/min via INTRAVENOUS

## 2019-11-04 MED ORDER — LIDOCAINE HCL (CARDIAC) PF 100 MG/5ML IV SOSY
PREFILLED_SYRINGE | INTRAVENOUS | Status: DC | PRN
  Administered 2019-11-04: 70 mg via INTRATRACHEAL

## 2019-11-04 MED ORDER — DICYCLOMINE HCL 20 MG PO TABS: 20.0000 mg | ORAL_TABLET | Freq: Three times a day (TID) | ORAL | 0 refills | Status: DC

## 2019-11-04 MED ORDER — LACTATED RINGERS IV SOLN
INTRAVENOUS | Status: DC
  Administered 2019-11-04: 1000 mL via INTRAVENOUS

## 2019-11-04 MED ORDER — SODIUM CHLORIDE 0.9 % IV SOLN: INTRAVENOUS | Status: DC

## 2019-11-04 MED ORDER — PROPOFOL 500 MG/50ML IV EMUL
INTRAVENOUS | Status: AC
  Filled 2019-11-04: qty 50

## 2019-11-04 MED ORDER — PROPOFOL 10 MG/ML IV BOLUS
INTRAVENOUS | Status: AC
  Filled 2019-11-04: qty 40

## 2019-11-04 SURGICAL SUPPLY — 24 items

## 2019-11-04 NOTE — Interval H&P Note (Signed)
History and Physical Interval Note:  11/04/2019 10:52 AM  Kimberly Bruce  has presented today for surgery, with the diagnosis of GI bleed, weight loss, excessive salivation.  The various methods of treatment have been discussed with the patient and family. After consideration of risks, benefits and other options for treatment, the patient has consented to  Procedure(s) with comments: ESOPHAGOGASTRODUODENOSCOPY (EGD) WITH PROPOFOL (N/A) - difficult IV access COLONOSCOPY WITH PROPOFOL (N/A) as a surgical intervention.  The patient's history has been reviewed, patient examined, no change in status, stable for surgery.  I have reviewed the patient's chart and labs.  Questions were answered to the patient's satisfaction.     Tiger Spieker

## 2019-11-04 NOTE — Transfer of Care (Signed)
Immediate Anesthesia Transfer of Care Note  Patient: Kimberly Bruce  Procedure(s) Performed: ESOPHAGOGASTRODUODENOSCOPY (EGD) WITH PROPOFOL (N/A ) COLONOSCOPY WITH PROPOFOL (N/A ) BIOPSY  Patient Location: PACU and Endoscopy Unit  Anesthesia Type:MAC  Level of Consciousness: awake, alert , oriented and patient cooperative  Airway & Oxygen Therapy: Patient Spontanous Breathing and Patient connected to face mask oxygen  Post-op Assessment: Report given to RN and Post -op Vital signs reviewed and stable  Post vital signs: Reviewed and stable  Last Vitals:  Vitals Value Taken Time  BP 127/66 11/04/19 1202  Temp 36.3 C 11/04/19 1202  Pulse 98 11/04/19 1205  Resp 10 11/04/19 1205  SpO2 100 % 11/04/19 1205  Vitals shown include unvalidated device data.  Last Pain:  Vitals:   11/04/19 1202  TempSrc: Temporal  PainSc: 0-No pain         Complications: No apparent anesthesia complications

## 2019-11-04 NOTE — Anesthesia Preprocedure Evaluation (Signed)
Anesthesia Evaluation  Patient identified by MRN, date of birth, ID band Patient awake    Reviewed: Allergy & Precautions, NPO status , Patient's Chart, lab work & pertinent test results  History of Anesthesia Complications Negative for: history of anesthetic complications  Airway Mallampati: II  TM Distance: >3 FB Neck ROM: Full    Dental  (+) Dental Advisory Given, Loose,    Pulmonary neg pulmonary ROS,    breath sounds clear to auscultation       Cardiovascular negative cardio ROS   Rhythm:Regular Rate:Tachycardia     Neuro/Psych PSYCHIATRIC DISORDERS Anxiety negative neurological ROS     GI/Hepatic Neg liver ROS, PUD, GIB, weight loss, excessive salivation   Endo/Other  negative endocrine ROS  Renal/GU negative Renal ROS  negative genitourinary   Musculoskeletal negative musculoskeletal ROS (+)   Abdominal Normal abdominal exam  (+)   Peds  Hematology  (+) anemia ,   Anesthesia Other Findings   Reproductive/Obstetrics negative OB ROS                             Anesthesia Physical  Anesthesia Plan  ASA: II  Anesthesia Plan: MAC   Post-op Pain Management:    Induction:   PONV Risk Score and Plan: 2 and Propofol infusion, Treatment may vary due to age or medical condition and TIVA  Airway Management Planned: Nasal Cannula and Natural Airway  Additional Equipment: None  Intra-op Plan:   Post-operative Plan:   Informed Consent: I have reviewed the patients History and Physical, chart, labs and discussed the procedure including the risks, benefits and alternatives for the proposed anesthesia with the patient or authorized representative who has indicated his/her understanding and acceptance.       Plan Discussed with: CRNA  Anesthesia Plan Comments:         Anesthesia Quick Evaluation

## 2019-11-04 NOTE — Anesthesia Postprocedure Evaluation (Signed)
Anesthesia Post Note  Patient: Kimberly Bruce  Procedure(s) Performed: ESOPHAGOGASTRODUODENOSCOPY (EGD) WITH PROPOFOL (N/A ) COLONOSCOPY WITH PROPOFOL (N/A ) BIOPSY     Patient location during evaluation: PACU Anesthesia Type: MAC Level of consciousness: awake and alert Pain management: pain level controlled Vital Signs Assessment: post-procedure vital signs reviewed and stable Respiratory status: spontaneous breathing, nonlabored ventilation and respiratory function stable Cardiovascular status: blood pressure returned to baseline and stable Postop Assessment: no apparent nausea or vomiting Anesthetic complications: no    Last Vitals:  Vitals:   11/04/19 1030 11/04/19 1202  BP: 139/81 127/66  Pulse: (!) 124 99  Resp: 20 17  Temp: 36.9 C (!) 36.3 C  SpO2: 100% 100%    Last Pain:  Vitals:   11/04/19 1202  TempSrc: Temporal  PainSc: 0-No pain                 Pervis Hocking

## 2019-11-04 NOTE — Op Note (Signed)
[facility] Patient Name: Kimberly Bruce Procedure Date: 11/04/2019 MRN: [id number] Attending MD: Mauri Pole , MD Date of Birth: 1971/08/23 CSN: [id number] Age: 49 Admit Type: Outpatient Procedure:                Colonoscopy Indications:              Screening for colorectal malignant neoplasm Providers:                Mauri Pole, MD, Cleda Daub, RN, Elspeth Cho Tech., Technician, Karis Juba, CRNA Referring MD:              Medicines:                Monitored Anesthesia Care Complications:            No immediate complications. Estimated Blood Loss:     Estimated blood loss: none. Procedure:                Pre-Anesthesia Assessment:                           - Prior to the procedure, a History and Physical                            was performed, and patient medications and                            allergies were reviewed. The patient's tolerance of                            previous anesthesia was also reviewed. The risks                            and benefits of the procedure and the sedation                            options and risks were discussed with the patient.                            All questions were answered, and informed consent                            was obtained. Prior Anticoagulants: The patient has                            taken no previous anticoagulant or antiplatelet                            agents. ASA Grade Assessment: II - A patient with                            mild systemic disease. After reviewing the risks  and benefits, the patient was deemed in                            satisfactory condition to undergo the procedure.                           - Prior to the procedure, a History and Physical                            was performed, and patient medications and                            allergies were reviewed. The patient's tolerance of                             previous anesthesia was also reviewed. The risks                            and benefits of the procedure and the sedation                            options and risks were discussed with the patient.                            All questions were answered, and informed consent                            was obtained. Prior Anticoagulants: The patient has                            taken no previous anticoagulant or antiplatelet                            agents. ASA Grade Assessment: II - A patient with                            mild systemic disease. After reviewing the risks                            and benefits, the patient was deemed in                            satisfactory condition to undergo the procedure.                           After obtaining informed consent, the colonoscope                            was passed under direct vision. Throughout the                            procedure, the patient's blood pressure, pulse, and  oxygen saturations were monitored continuously. The                            [id number]) Olympus pediatric colonscope                            was introduced through the anus and advanced to the                            the cecum, identified by appendiceal orifice and                            ileocecal valve. The colonoscopy was performed                            without difficulty. The patient tolerated the                            procedure well. The quality of the bowel                            preparation was excellent. The ileocecal valve,                            appendiceal orifice, and rectum were photographed. Scope In: 11:34:51 AM Scope Out: 11:53:42 AM Scope Withdrawal Time: 0 hours 11 minutes 20 seconds  Total Procedure Duration: 0 hours 18 minutes 51 seconds  Findings:      The perianal and digital rectal examinations were normal.      Non-bleeding internal  hemorrhoids were found during retroflexion. The       hemorrhoids were small.      The exam was otherwise without abnormality. Impression:               - Non-bleeding internal hemorrhoids.                           - The examination was otherwise normal.                           - No specimens collected. Moderate Sedation:      Not Applicable - Patient had care per Anesthesia. Recommendation:           - Patient has a contact number available for                            emergencies. The signs and symptoms of potential                            delayed complications were discussed with the                            patient. Return to normal activities tomorrow.                            Written discharge instructions were provided to the  patient.                           - Resume previous diet.                           - Continue present medications.                           - Repeat colonoscopy in 10 years for screening                            purposes.                           - Return to GI clinic in 2 months, next available                            appointment. Procedure Code(s):        --- Professional ---                           [id number], Colorectal cancer screening; colonoscopy on                            individual not meeting criteria for high risk Diagnosis Code(s):        --- Professional ---                           Z12.11, Encounter for screening for malignant                            neoplasm of colon                           K64.8, Other hemorrhoids CPT copyright 2019 American Medical Association. All rights reserved. The codes documented in this report are preliminary and upon coder review may  be revised to meet current compliance requirements. Mauri Pole, MD 11/04/2019 12:03:26 PM This report has been signed electronically. Number of Addenda: 0

## 2019-11-04 NOTE — H&P (Signed)
[facility] History and Physical   Primary Care Physician:  [facility]   Reason for Procedure:  H/o gastric ulcer, anemia, weight loss, colorectal cancer screening Plan:    EGD and colonoscopy with possible intervention     HPI: Kimberly Bruce is a 49 y.o. female with h/o chronic non healing gastric ulcer, iron deficiency anemia, with decreased appetite and weight loss. Due for colorectal cancer screening.    Past Medical History:  Diagnosis Date  . Anemia    since teenage years  . Anxiety 1996  . Blood transfusion without reported diagnosis   . Insomnia   . Ovarian cyst     Past Surgical History:  Procedure Laterality Date  . CESAREAN SECTION     x2   . ESOPHAGOGASTRODUODENOSCOPY N/A 02/21/2016   Procedure: ESOPHAGOGASTRODUODENOSCOPY (EGD);  Surgeon: Mauri Pole, MD;  Location: [facility] ENDOSCOPY;  Service: Endoscopy;  Laterality: N/A;  . ESOPHAGOGASTRODUODENOSCOPY (EGD) WITH PROPOFOL N/A 03/22/2019   Procedure: ESOPHAGOGASTRODUODENOSCOPY (EGD) WITH PROPOFOL;  Surgeon: Irene Shipper, MD;  Location: [facility] ENDOSCOPY;  Service: Endoscopy;  Laterality: N/A;  10am EGD with Dr. Henrene Pastor   . HEMOSTASIS CONTROL  03/22/2019   Procedure: HEMOSTASIS CONTROL;  Surgeon: Irene Shipper, MD;  Location: [facility] ENDOSCOPY;  Service: Endoscopy;;  . HOT HEMOSTASIS N/A 03/22/2019   Procedure: HOT HEMOSTASIS (ARGON PLASMA COAGULATION/BICAP);  Surgeon: Irene Shipper, MD;  Location: [facility];  Service: Endoscopy;  Laterality: N/A;  . LAPAROSCOPY N/A 01/30/2014   Procedure: LAPAROSCOPY DIAGNOSTIC;  Surgeon: Lovenia Kim, MD;  Location: [facility];  Service: Gynecology;  Laterality: N/A;  . ROBOTIC ASSISTED LAPAROSCOPIC LYSIS OF ADHESION N/A 01/30/2014   Procedure: Possible ROBOTIC ASSISTED LAPAROSCOPIC LYSIS OF ADHESION WITH LEFT SALPINGO OOPHORECTOMY;  Surgeon: Lovenia Kim, MD;  Location: [facility];  Service: Gynecology;  Laterality: N/A;  . WISDOM TOOTH EXTRACTION      Prior to  Admission medications   Medication Sig Start Date End Date Taking? Authorizing Provider  dexlansoprazole (DEXILANT) 60 MG capsule Take 1 capsule (60 mg total) by mouth daily. 09/12/19  Yes Mauri Pole, MD  Na Sulfate-K Sulfate-Mg Sulf 17.5-3.13-1.6 GM/177ML SOLN 1 prep kit 10/08/19  Yes Loretta Doutt V, MD  sucralfate (CARAFATE) 1 g tablet TAKE 1 TABLET(1 GRAM) BY MOUTH FOUR TIMES DAILY WITH MEALS AND AT BEDTIME 09/15/19  Yes Joquan Lotz, Venia Minks, MD    Current Facility-Administered Medications  Medication Dose Route Frequency Provider Last Rate Last Admin  . 0.9 %  sodium chloride infusion   Intravenous Continuous Mauri Pole, MD        Allergies as of 10/08/2019 - Review Complete 10/07/2019  Allergen Reaction Noted  . Citalopram Nausea And Vomiting 04/07/2013    Family History  Problem Relation Age of Onset  . Aneurysm Father        brain  . Hepatitis C Father   . Breast cancer Maternal Aunt        breast  . Cancer Maternal Uncle        prostate?  Marland Kitchen Heart disease Paternal Grandmother   . Alcohol abuse Maternal Grandmother   . Other Paternal Grandfather        unknown  . Lung cancer Sister 41       died  . Colon cancer Neg Hx   . Esophageal cancer Neg Hx   . Stomach cancer Neg Hx   . Rectal cancer Neg Hx   . Colon polyps Neg Hx  Social History   Socioeconomic History  . Marital status: Single    Spouse name: Not on file  . Number of children: Not on file  . Years of education: Not on file  . Highest education level: Not on file  Occupational History  . Not on file  Tobacco Use  . Smoking status: Never Smoker  . Smokeless tobacco: Never Used  Substance and Sexual Activity  . Alcohol use: No    Alcohol/week: 0.0 standard drinks    Comment: occasionally  . Drug use: No  . Sexual activity: Yes    Birth control/protection: None  Other Topics Concern  . Not on file  Social History Narrative   Her 46yo daughter [name] lives with  her, [name], works as Employment IT sales professional at [employer], exercising 5 days per week.  Likes to do kickboxing.  In relationship 2 years.    Social Determinants of Health   Financial Resource Strain:   . Difficulty of Paying Living Expenses: Not on file  Food Insecurity:   . Worried About Charity fundraiser in the Last Year: Not on file  . Ran Out of Food in the Last Year: Not on file  Transportation Needs:   . Lack of Transportation (Medical): Not on file  . Lack of Transportation (Non-Medical): Not on file  Physical Activity:   . Days of Exercise per Week: Not on file  . Minutes of Exercise per Session: Not on file  Stress:   . Feeling of Stress : Not on file  Social Connections:   . Frequency of Communication with Friends and Family: Not on file  . Frequency of Social Gatherings with Friends and Family: Not on file  . Attends Religious Services: Not on file  . Active Member of Clubs or Organizations: Not on file  . Attends Archivist Meetings: Not on file  . Marital Status: Not on file  Intimate Partner Violence:   . Fear of Current or Ex-Partner: Not on file  . Emotionally Abused: Not on file  . Physically Abused: Not on file  . Sexually Abused: Not on file    Review of Systems:  All other review of systems negative except as mentioned in the HPI.  Physical Exam: Vital signs in last 24 hours: Temp:  [98.5 F (36.9 C)] 98.5 F (36.9 C) (01/12 1030) Pulse Rate:  [124] 124 (01/12 1030) Resp:  [20] 20 (01/12 1030) BP: (139)/(81) 139/81 (01/12 1030) SpO2:  [100 %] 100 % (01/12 1030)   General:   Alert,  Well-developed, well-nourished, pleasant and cooperative in NAD Lungs:  Clear throughout to auscultation.   Heart:  Regular rate and rhythm; no murmurs, clicks, rubs,  or gallops. Abdomen:  Soft, nontender and nondistended. Normal bowel sounds.   Neuro/Psych:  Alert and cooperative. Normal mood and affect. A and O x 3   K.  Denzil Magnuson , MD [phone]

## 2019-11-04 NOTE — Telephone Encounter (Signed)
Having sharp abdominal pains since last Thursday - not different since she had EGD and colonoscopy today. No fever.  Passing gas  Not vomiting  Will try dicyclomine 20 mg ac/hs

## 2019-11-04 NOTE — Op Note (Signed)
[facility] Patient Name: Kimberly Bruce Procedure Date: 11/04/2019 MRN: [id number] Attending MD: Mauri Pole , MD Date of Birth: 18-May-1971 CSN: [id number] Age: 49 Admit Type: Outpatient Procedure:                Upper GI endoscopy Indications:              Suspected upper gastrointestinal bleeding in                            patient with chronic blood loss, Iron deficiency                            anemia due to suspected upper gastrointestinal                            bleeding, Dyspepsia Providers:                Mauri Pole, MD, Cleda Daub, RN, Elspeth Cho Tech., Technician, Corie Chiquito,                            Technician, Karis Juba, CRNA Referring MD:              Medicines:                Monitored Anesthesia Care Complications:            No immediate complications. Estimated Blood Loss:     Estimated blood loss was minimal. Procedure:                Pre-Anesthesia Assessment:                           - Prior to the procedure, a History and Physical                            was performed, and patient medications and                            allergies were reviewed. The patient's tolerance of                            previous anesthesia was also reviewed. The risks                            and benefits of the procedure and the sedation                            options and risks were discussed with the patient.                            All questions were answered, and informed consent                            was obtained.  Prior Anticoagulants: The patient has                            taken no previous anticoagulant or antiplatelet                            agents. ASA Grade Assessment: II - A patient with                            mild systemic disease. After reviewing the risks                            and benefits, the patient was deemed in   satisfactory condition to undergo the procedure.                           After obtaining informed consent, the endoscope was                            passed under direct vision. Throughout the                            procedure, the patient's blood pressure, pulse, and                            oxygen saturations were monitored continuously. The                            [id number] Olympus gastroscope was                            introduced through the mouth, and advanced to the                            second part of duodenum. The upper GI endoscopy was                            accomplished without difficulty. The patient                            tolerated the procedure well. Scope In: Scope Out: Findings:      The esophagus was normal.      A small hiatal hernia was present.      Patchy mild inflammation characterized by congestion (edema) and       erythema was found in the entire examined stomach. Biopsies were taken       with a cold forceps for Helicobacter pylori testing.      The examined duodenum was normal. Impression:               - Normal esophagus.                           - Small hiatal hernia.                           -  Gastritis. Biopsied.                           - Normal examined duodenum. Moderate Sedation:      Not Applicable - Patient had care per Anesthesia. Recommendation:           - Patient has a contact number available for                            emergencies. The signs and symptoms of potential                            delayed complications were discussed with the                            patient. Return to normal activities tomorrow.                            Written discharge instructions were provided to the                            patient.                           - Resume previous diet.                           - Continue present medications.                           - Await pathology results.                            - See the other procedure note for documentation of                            additional recommendations. Procedure Code(s):        --- Professional ---                           [phone], Esophagogastroduodenoscopy, flexible,                            transoral; with biopsy, single or multiple Diagnosis Code(s):        --- Professional ---                           K44.9, Diaphragmatic hernia without obstruction or                            gangrene                           K29.70, Gastritis, unspecified, without bleeding                           R58, Hemorrhage, not elsewhere classified  D50.9, Iron deficiency anemia, unspecified                           R10.13, Epigastric pain CPT copyright 2019 American Medical Association. All rights reserved. The codes documented in this report are preliminary and upon coder review may  be revised to meet current compliance requirements. Mauri Pole, MD 11/04/2019 12:01:45 PM This report has been signed electronically. Number of Addenda: 0

## 2019-11-04 NOTE — Discharge Instructions (Signed)
YOU HAD AN ENDOSCOPIC PROCEDURE TODAY: Refer to the procedure report and other information in the discharge instructions given to you for any specific questions about what was found during the examination. If this information does not answer your questions, please call [place] office at [phone] to clarify.  ° °YOU SHOULD EXPECT: Some feelings of bloating in the abdomen. Passage of more gas than usual. Walking can help get rid of the air that was put into your GI tract during the procedure and reduce the bloating. If you had a lower endoscopy (such as a colonoscopy or flexible sigmoidoscopy) you may notice spotting of blood in your stool or on the toilet paper. Some abdominal soreness may be present for a day or two, also. ° °DIET: Your first meal following the procedure should be a light meal and then it is ok to progress to your normal diet. A half-sandwich or bowl of soup is an example of a good first meal. Heavy or fried foods are harder to digest and may make you feel nauseous or bloated. Drink plenty of fluids but you should avoid alcoholic beverages for 24 hours. If you had a esophageal dilation, please see attached instructions for diet.   ° °ACTIVITY: Your care partner should take you home directly after the procedure. You should plan to take it easy, moving slowly for the rest of the day. You can resume normal activity the day after the procedure however YOU SHOULD NOT DRIVE, use power tools, machinery or perform tasks that involve climbing or major physical exertion for 24 hours (because of the sedation medicines used during the test).  ° °SYMPTOMS TO REPORT IMMEDIATELY: °A gastroenterologist can be reached at any hour. Please call [phone]  for any of the following symptoms:  °Following lower endoscopy (colonoscopy, flexible sigmoidoscopy) °Excessive amounts of blood in the stool  °Significant tenderness, worsening of abdominal pains  °Swelling of the abdomen that is new, acute  °Fever of 100° or  higher  °Following upper endoscopy (EGD, EUS, ERCP, esophageal dilation) °Vomiting of blood or coffee ground material  °New, significant abdominal pain  °New, significant chest pain or pain under the shoulder blades  °Painful or persistently difficult swallowing  °New shortness of breath  °Black, tarry-looking or red, bloody stools ° °FOLLOW UP:  °If any biopsies were taken you will be contacted by phone or by letter within the next 1-3 weeks. Call [phone]  if you have not heard about the biopsies in 3 weeks.  °Please also call with any specific questions about appointments or follow up tests. ° °

## 2019-11-05 ENCOUNTER — Other Ambulatory Visit: Payer: Self-pay

## 2019-11-05 ENCOUNTER — Encounter: Payer: Self-pay | Admitting: *Deleted

## 2019-11-05 LAB — SURGICAL PATHOLOGY

## 2019-11-05 NOTE — Telephone Encounter (Signed)
Patient calls back. She has not started dicyclomine yet. Not picked it up. She reports sharp pain "under my rib cage where my stomach is." Described as sharp intermittent pain unchanged with a deep breath and improved as she breathes out. No vomiting, dizziness or difficulty breathing. No new symptoms since last night.

## 2019-11-05 NOTE — Telephone Encounter (Signed)
OK, thank you.

## 2019-11-05 NOTE — Telephone Encounter (Signed)
Thank you Glendell Docker.  Beth, can you please call patient this a.m. and follow-up to see how she is doing.  Thank you

## 2019-11-05 NOTE — Telephone Encounter (Signed)
Called the patient phone. No answer. Left message with the reason for the call and my contact information.

## 2019-12-01 ENCOUNTER — Encounter (HOSPITAL_COMMUNITY): Payer: Self-pay

## 2019-12-01 ENCOUNTER — Emergency Department (HOSPITAL_COMMUNITY): Payer: 59

## 2019-12-01 ENCOUNTER — Other Ambulatory Visit: Payer: Self-pay

## 2019-12-01 ENCOUNTER — Emergency Department (HOSPITAL_COMMUNITY)
Admission: EM | Admit: 2019-12-01 | Discharge: 2019-12-01 | Disposition: A | Discharge status: Home / Self Care | Payer: 59 | Attending: Emergency Medicine | Admitting: Emergency Medicine

## 2019-12-01 DIAGNOSIS — R1032 Left lower quadrant pain: Secondary | ICD-10-CM | POA: Insufficient documentation

## 2019-12-01 DIAGNOSIS — Z79899 Other long term (current) drug therapy: Secondary | ICD-10-CM | POA: Diagnosis not present

## 2019-12-01 DIAGNOSIS — K5900 Constipation, unspecified: Secondary | ICD-10-CM | POA: Diagnosis not present

## 2019-12-01 DIAGNOSIS — K625 Hemorrhage of anus and rectum: Secondary | ICD-10-CM | POA: Diagnosis present

## 2019-12-01 HISTORY — DX: Unspecified abdominal hernia without obstruction or gangrene: K46.9

## 2019-12-01 LAB — URINALYSIS, ROUTINE W REFLEX MICROSCOPIC
Bacteria, UA: NONE SEEN
Bilirubin Urine: NEGATIVE
Glucose, UA: NEGATIVE mg/dL
Ketones, ur: 5 mg/dL — AB
Nitrite: NEGATIVE
Protein, ur: NEGATIVE mg/dL
Specific Gravity, Urine: 1.01 (ref 1.005–1.030)
pH: 7 (ref 5.0–8.0)

## 2019-12-01 LAB — COMPREHENSIVE METABOLIC PANEL
ALT: 8 U/L (ref 0–44)
AST: 20 U/L (ref 15–41)
Albumin: 4.3 g/dL (ref 3.5–5.0)
Alkaline Phosphatase: 95 U/L (ref 38–126)
Anion gap: 9 (ref 5–15)
BUN: 13 mg/dL (ref 6–20)
CO2: 28 mmol/L (ref 22–32)
Calcium: 9.4 mg/dL (ref 8.9–10.3)
Chloride: 103 mmol/L (ref 98–111)
Creatinine, Ser: 0.72 mg/dL (ref 0.44–1.00)
GFR calc Af Amer: >60 mL/min (ref >60)
GFR calc non Af Amer: >60 mL/min (ref >60)
Glucose, Bld: 102 mg/dL — ABNORMAL HIGH (ref 70–99)
Potassium: 3.8 mmol/L (ref 3.5–5.1)
Sodium: 140 mmol/L (ref 135–145)
Total Bilirubin: 0.5 mg/dL (ref 0.3–1.2)
Total Protein: 8.7 g/dL — ABNORMAL HIGH (ref 6.5–8.1)

## 2019-12-01 LAB — TYPE AND SCREEN
ABO/RH(D): O POS
Antibody Screen: NEGATIVE

## 2019-12-01 LAB — CBC
HCT: 36.8 % (ref 36.0–46.0)
Hemoglobin: 11.4 g/dL — ABNORMAL LOW (ref 12.0–15.0)
MCH: 30.8 pg (ref 26.0–34.0)
MCHC: 31 g/dL (ref 30.0–36.0)
MCV: 99.5 fL (ref 80.0–100.0)
Platelets: 464 10*3/uL — ABNORMAL HIGH (ref 150–400)
RBC: 3.7 MIL/uL — ABNORMAL LOW (ref 3.87–5.11)
RDW: 13.3 % (ref 11.5–15.5)
WBC: 5.8 10*3/uL (ref 4.0–10.5)
nRBC: 0 % (ref 0.0–0.2)

## 2019-12-01 LAB — I-STAT BETA HCG BLOOD, ED (MC, WL, AP ONLY): I-stat hCG, quantitative: <5 m[IU]/mL (ref <5)

## 2019-12-01 LAB — POC OCCULT BLOOD, ED: Fecal Occult Bld: NEGATIVE

## 2019-12-01 LAB — LIPASE, BLOOD: Lipase: 28 U/L (ref 11–51)

## 2019-12-01 MED ORDER — DOCUSATE SODIUM 100 MG PO CAPS: 100.0000 mg | ORAL_CAPSULE | Freq: Two times a day (BID) | ORAL | 0 refills | Status: DC

## 2019-12-01 MED ORDER — ONDANSETRON HCL 4 MG/2ML IJ SOLN
4.0000 mg | Freq: Once | INTRAMUSCULAR | Status: AC
  Administered 2019-12-01: 18:00:00 4 mg via INTRAVENOUS
  Filled 2019-12-01: qty 2

## 2019-12-01 MED ORDER — SODIUM CHLORIDE (PF) 0.9 % IJ SOLN
INTRAMUSCULAR | Status: AC
  Filled 2019-12-01: qty 50

## 2019-12-01 MED ORDER — FENTANYL CITRATE (PF) 100 MCG/2ML IJ SOLN
100.0000 ug | Freq: Once | INTRAMUSCULAR | Status: AC
  Administered 2019-12-01: 18:00:00 100 ug via INTRAVENOUS
  Filled 2019-12-01: qty 2

## 2019-12-01 MED ORDER — SODIUM CHLORIDE 0.9 % IV BOLUS
1000.0000 mL | Freq: Once | INTRAVENOUS | Status: AC
  Administered 2019-12-01: 16:00:00 1000 mL via INTRAVENOUS

## 2019-12-01 MED ORDER — IOHEXOL 300 MG/ML  SOLN
100.0000 mL | Freq: Once | INTRAMUSCULAR | Status: AC | PRN
  Administered 2019-12-01: 19:00:00 100 mL via INTRAVENOUS

## 2019-12-01 MED ORDER — ONDANSETRON HCL 4 MG/2ML IJ SOLN
4.0000 mg | Freq: Once | INTRAMUSCULAR | Status: AC
  Administered 2019-12-01: 4 mg via INTRAVENOUS
  Filled 2019-12-01: qty 2

## 2019-12-01 MED ORDER — MORPHINE SULFATE (PF) 4 MG/ML IV SOLN
4.0000 mg | Freq: Once | INTRAVENOUS | Status: AC
  Administered 2019-12-01: 16:00:00 4 mg via INTRAVENOUS
  Filled 2019-12-01: qty 1

## 2019-12-01 NOTE — ED Notes (Signed)
Attempted to call Kimberly Bruce patients friend. No answer. Patient made aware.

## 2019-12-01 NOTE — ED Notes (Signed)
Per patient please update [phone].

## 2019-12-01 NOTE — ED Provider Notes (Signed)
[facility] DEPT Provider Note   CSN: [id number] Arrival date & time: 12/01/19  1201     History Chief Complaint  Patient presents with  . Rectal Bleeding  . Abdominal Pain    Kimberly Bruce is a 49 y.o. female has no history of abdominal hernia, anemia, anxiety, insomnia, ovarian cyst who presents today for evaluation of rectal bleeding.  She states that today when she went to the bathroom, she noticed dark blood in the toilet bowl after she had a bowel movement.  She states that she has also had 2 days of left lower quadrant abdominal pain.  She has not had any nausea/vomiting or fevers.  She recently got a tooth pulled about 4 days ago and was started on tramadol, hydrocodone and penicillin.  Patient is concerned that these medications may have irritated her ulcer, causing her to bleed.  She is not currently on blood thinners.  She has been taking the hydrocodone as directed.  She denies any chest pain, difficulty breathing, cough, dysuria, hematuria.  The history is provided by the patient.       Past Medical History:  Diagnosis Date  . Abdominal hernia   . Anemia    since teenage years  . Anxiety 1996  . Blood transfusion without reported diagnosis   . Insomnia   . Ovarian cyst     Patient Active Problem List   Diagnosis Date Noted  . Loss of weight   . History of gastric ulcer   . Iron deficiency anemia due to chronic blood loss   . Special screening for malignant neoplasms, colon   . Abdominal pain 03/23/2019  . Sinus tachycardia 03/23/2019  . Upper GI bleed   . Symptomatic anemia 03/21/2019  . UTI (lower urinary tract infection) 02/24/2016  . Helicobacter pylori ab+ 02/24/2016  . Acute gastric ulcer   . Acute GI bleeding 02/21/2016  . Acute blood loss anemia 02/21/2016  . Pregnancy test positive 02/21/2016  . Gastric ulcer with hemorrhage     Past Surgical History:  Procedure Laterality Date  . BIOPSY  11/04/2019   Procedure:  BIOPSY;  Surgeon: Mauri Pole, MD;  Location: [facility];  Service: Endoscopy;;  . CESAREAN SECTION     x2   . COLONOSCOPY WITH PROPOFOL N/A 11/04/2019   Procedure: COLONOSCOPY WITH PROPOFOL;  Surgeon: Mauri Pole, MD;  Location: [facility];  Service: Endoscopy;  Laterality: N/A;  . ESOPHAGOGASTRODUODENOSCOPY N/A 02/21/2016   Procedure: ESOPHAGOGASTRODUODENOSCOPY (EGD);  Surgeon: Mauri Pole, MD;  Location: [facility] ENDOSCOPY;  Service: Endoscopy;  Laterality: N/A;  . ESOPHAGOGASTRODUODENOSCOPY (EGD) WITH PROPOFOL N/A 03/22/2019   Procedure: ESOPHAGOGASTRODUODENOSCOPY (EGD) WITH PROPOFOL;  Surgeon: Irene Shipper, MD;  Location: [facility];  Service: Endoscopy;  Laterality: N/A;  10am EGD with Dr. Henrene Pastor   . ESOPHAGOGASTRODUODENOSCOPY (EGD) WITH PROPOFOL N/A 11/04/2019   Procedure: ESOPHAGOGASTRODUODENOSCOPY (EGD) WITH PROPOFOL;  Surgeon: Mauri Pole, MD;  Location: [facility];  Service: Endoscopy;  Laterality: N/A;  difficult IV access  . HEMOSTASIS CONTROL  03/22/2019   Procedure: HEMOSTASIS CONTROL;  Surgeon: Irene Shipper, MD;  Location: [facility];  Service: Endoscopy;;  . HOT HEMOSTASIS N/A 03/22/2019   Procedure: HOT HEMOSTASIS (ARGON PLASMA COAGULATION/BICAP);  Surgeon: Irene Shipper, MD;  Location: [facility];  Service: Endoscopy;  Laterality: N/A;  . LAPAROSCOPY N/A 01/30/2014   Procedure: LAPAROSCOPY DIAGNOSTIC;  Surgeon: Lovenia Kim, MD;  Location: [facility];  Service: Gynecology;  Laterality: N/A;  . ROBOTIC  ASSISTED LAPAROSCOPIC LYSIS OF ADHESION N/A 01/30/2014   Procedure: Possible ROBOTIC ASSISTED LAPAROSCOPIC LYSIS OF ADHESION WITH LEFT SALPINGO OOPHORECTOMY;  Surgeon: Lovenia Kim, MD;  Location: [facility];  Service: Gynecology;  Laterality: N/A;  . WISDOM TOOTH EXTRACTION       OB History   No obstetric history on file.     Family History  Problem Relation Age of Onset  . Aneurysm Father        brain  . Hepatitis C Father   . Breast  cancer Maternal Aunt        breast  . Cancer Maternal Uncle        prostate?  Marland Kitchen Heart disease Paternal Grandmother   . Alcohol abuse Maternal Grandmother   . Other Paternal Grandfather        unknown  . Lung cancer Sister 79       died  . Colon cancer Neg Hx   . Esophageal cancer Neg Hx   . Stomach cancer Neg Hx   . Rectal cancer Neg Hx   . Colon polyps Neg Hx     Social History   Tobacco Use  . Smoking status: Never Smoker  . Smokeless tobacco: Never Used  Substance Use Topics  . Alcohol use: Not Currently    Alcohol/week: 0.0 standard drinks    Comment: occasionally  . Drug use: No    Home Medications Prior to Admission medications   Medication Sig Start Date End Date Taking? Authorizing Provider  penicillin v potassium (VEETID) 500 MG tablet Take 500 mg by mouth 4 (four) times daily. 11/27/19  Yes [provider]  traMADol (ULTRAM) 50 MG tablet Take 50 mg by mouth 2 (two) times daily as needed for pain. 11/28/19  Yes [provider]  zolpidem (AMBIEN) 10 MG tablet Take 10 mg by mouth at bedtime. 11/24/19  Yes [provider]  dexlansoprazole (DEXILANT) 60 MG capsule Take 1 capsule (60 mg total) by mouth daily. Patient not taking: Reported on 12/01/2019 09/12/19   Mauri Pole, MD  dicyclomine (BENTYL) 20 MG tablet Take 1 tablet (20 mg total) by mouth 4 (four) times daily -  before meals and at bedtime. Patient not taking: Reported on 12/01/2019 11/04/19   Gatha Mayer, MD  docusate sodium (COLACE) 100 MG capsule Take 1 capsule (100 mg total) by mouth every 12 (twelve) hours. 12/01/19   Volanda Napoleon, PA-C  sucralfate (CARAFATE) 1 g tablet TAKE 1 TABLET(1 GRAM) BY MOUTH FOUR TIMES DAILY WITH MEALS AND AT BEDTIME Patient not taking: TAKE 1 TABLET(1 GRAM) BY MOUTH FOUR TIMES DAILY WITH MEALS AND AT BEDTIME 09/15/19   Mauri Pole, MD    Allergies    Citalopram  Review of Systems   Review of Systems  Constitutional: Negative for  fever.  Respiratory: Negative for cough and shortness of breath.   Cardiovascular: Negative for chest pain.  Gastrointestinal: Positive for abdominal pain and blood in stool. Negative for nausea and vomiting.  Genitourinary: Negative for dysuria and hematuria.  Neurological: Negative for weakness and headaches.  All other systems reviewed and are negative.   Physical Exam Updated Vital Signs BP (!) 150/83   Pulse 86   Temp 98.5 F (36.9 C) (Oral)   Resp 18   Ht 4\' 11"  (1.499 m)   Wt 68 kg   LMP 04/12/2017   SpO2 100%   BMI 30.30 kg/m   Physical Exam Vitals and nursing note reviewed. Exam conducted with  a chaperone present.  Constitutional:      Appearance: Normal appearance. She is well-developed.     Comments: Sitting comfortably on examination table  HENT:     Head: Normocephalic and atraumatic.     Mouth/Throat:   Eyes:     General: Lids are normal.     Conjunctiva/sclera: Conjunctivae normal.     Pupils: Pupils are equal, round, and reactive to light.  Cardiovascular:     Rate and Rhythm: Normal rate and regular rhythm.     Pulses: Normal pulses.     Heart sounds: Normal heart sounds. No murmur. No friction rub. No gallop.   Pulmonary:     Effort: Pulmonary effort is normal.     Breath sounds: Normal breath sounds.     Comments: Lungs clear to auscultation bilaterally.  Symmetric chest rise.  No wheezing, rales, rhonchi. Abdominal:     Palpations: Abdomen is soft. Abdomen is not rigid.     Tenderness: There is abdominal tenderness in the left lower quadrant. There is no guarding.     Comments: Abdomen is soft, nondistended.  Tenderness noted in left lower quadrant.  No rigidity, guarding.  Genitourinary:    Comments: The exam was performed with a chaperone present. Digital Rectal Exam reveals sphincter with good tone. No external hemorrhoids. No masses or fissures. Stool color is brown with no overt blood. Musculoskeletal:        General: Normal range of motion.      Cervical back: Full passive range of motion without pain.  Skin:    General: Skin is warm and dry.     Capillary Refill: Capillary refill takes less than 2 seconds.  Neurological:     Mental Status: She is alert and oriented to person, place, and time.  Psychiatric:        Speech: Speech normal.     ED Results / Procedures / Treatments   Labs (all labs ordered are listed, but only abnormal results are displayed) Labs Reviewed  COMPREHENSIVE METABOLIC PANEL - Abnormal; Notable for the following components:      Result Value   Glucose, Bld 102 (*)    Total Protein 8.7 (*)    All other components within normal limits  CBC - Abnormal; Notable for the following components:   RBC 3.70 (*)    Hemoglobin 11.4 (*)    Platelets 464 (*)    All other components within normal limits  URINALYSIS, ROUTINE W REFLEX MICROSCOPIC - Abnormal; Notable for the following components:   Color, Urine STRAW (*)    Hgb urine dipstick SMALL (*)    Ketones, ur 5 (*)    Leukocytes,Ua SMALL (*)    All other components within normal limits  LIPASE, BLOOD  POC OCCULT BLOOD, ED  I-STAT BETA HCG BLOOD, ED ([facility], [facility], [facility] ONLY)  POC OCCULT BLOOD, ED  TYPE AND SCREEN    EKG None  Radiology CT Abdomen Pelvis W Contrast  Result Date: 12/01/2019 CLINICAL DATA:  Left lower quadrant abdominal pain EXAM: CT ABDOMEN AND PELVIS WITH CONTRAST TECHNIQUE: Multidetector CT imaging of the abdomen and pelvis was performed using the standard protocol following bolus administration of intravenous contrast. CONTRAST:  155mL OMNIPAQUE IOHEXOL 300 MG/ML  SOLN COMPARISON:  Mar 23, 2019 FINDINGS: Lower chest: The visualized heart size within normal limits. No pericardial fluid/thickening. No hiatal hernia. Streaky atelectasis seen at both lung bases. Hepatobiliary: The liver is normal in density without focal abnormality.The main portal vein is patent. No  evidence of calcified gallstones, gallbladder wall thickening or biliary  dilatation. Pancreas: Unremarkable. No pancreatic ductal dilatation or surrounding inflammatory changes. Spleen: Normal in size without focal abnormality. Adrenals/Urinary Tract: Both adrenal glands appear normal. Low-density lesions are seen in both upper poles of the kidneys the largest measuring 1.5 cm in the upper pole of right kidney which is unchanged from prior exam. No renal or collecting system calculi. The bladder is unremarkable. Stomach/Bowel: The stomach, small bowel, and colon are normal in appearance. No inflammatory changes, wall thickening, or obstructive findings.A moderate to large amount of colonic stool seen at the sigmoid rectal junction. The appendix is normal in appearance. Vascular/Lymphatic: There are no enlarged mesenteric, retroperitoneal, or pelvic lymph nodes. No significant vascular findings are present. Reproductive: Lobular heterogeneous nodular appearance to the posterior uterus is seen, likely fibroids. The adnexa are unremarkable. Other: No evidence of abdominal wall mass or hernia. Musculoskeletal: No acute or significant osseous findings. IMPRESSION: Moderate to large amount of colonic stool. No other acute intra-abdominal pathology to explain the patient's symptoms. Probable fibroid uterus. Electronically Signed   By: Prudencio Pair M.D.   On: 12/01/2019 19:39    Procedures Procedures (including critical care time)  Medications Ordered in ED Medications  sodium chloride (PF) 0.9 % injection (has no administration in time range)  morphine 4 MG/ML injection 4 mg (4 mg Intravenous Given 12/01/19 1610)  ondansetron (ZOFRAN) injection 4 mg (4 mg Intravenous Given 12/01/19 1609)  sodium chloride 0.9 % bolus 1,000 mL (0 mLs Intravenous Stopped 12/01/19 1733)  fentaNYL (SUBLIMAZE) injection 100 mcg (100 mcg Intravenous Given 12/01/19 1742)  ondansetron (ZOFRAN) injection 4 mg (4 mg Intravenous Given 12/01/19 1742)  iohexol (OMNIPAQUE) 300 MG/ML solution 100 mL (100 mLs Intravenous  Contrast Given 12/01/19 1913)    ED Course  I have reviewed the triage vital signs and the nursing notes.  Pertinent labs & imaging results that were available during my care of the patient were reviewed by me and considered in my medical decision making (see chart for details).    MDM Rules/Calculators/A&P                      49 year old female who presents for evaluation of rectal bleeding that began today as well as 2 days of abdominal pain in the left lower quadrant.  Recently had a tooth pulled and was started on penicillin, hydrocodone, tramadol.  No fevers, vomiting.  On initially arrival, she is slightly tachycardic, afebrile.  Vital signs are stable.  On exam, she has some mild tenderness palpation noted to left lower quadrant.  Question if this is infectious process such as diverticulitis versus hemorrhoids.  Plan to check labs.  Will plan imaging giving the left lower quadrant abdominal pain as well as rectal bleeding to ensure that this is not diverticulitis.  I reviewed patient's colonoscopy and endoscopy that she had done in January 2021.  Her colonoscopy did show evidence of internal hemorrhoids but no other abnormalities.  Question if her hydrocodone had made her constipated and caused the internal hemorrhoids to bleed.  CBC shows no leukocytosis.  Hemoglobin stable 11.9.  CMP shows normal BUN and creatinine.  Fecal occult is negative.  I-STAT beta is negative.  CT scan shows moderate to large amount of colonic stool.  No intra-abdominal pathology to explain symptoms.  She does have mention of possible uterine fibroid noted on CT scan.  Patient is hemodynamically stable here in the ED without signs of fecal  occult bleeding.  Hemoglobin is stable.  At this time, no evidence of acute GI bleed.  I suspect this is most likely from her hemorrhoids.  Discussed regarding constipation and pain medication.  Will give stool softeners.  Instructed patient to follow-up with her GI doctor. At  this time, patient exhibits no emergent life-threatening condition that require further evaluation in ED or admission. Patient had ample opportunity for questions and discussion. All patient's questions were answered with full understanding. Strict return precautions discussed. Patient expresses understanding and agreement to plan.   Portions of this note were generated with [employer]. Dictation errors may occur despite best attempts at proofreading.   Final Clinical Impression(s) / ED Diagnoses Final diagnoses:  Constipation, unspecified constipation type    Rx / DC Orders ED Discharge Orders         Ordered    docusate sodium (COLACE) 100 MG capsule  Every 12 hours     12/01/19 2011           Desma Mcgregor 12/02/19 0058    Margette Fast, MD 12/02/19 1020

## 2019-12-01 NOTE — Discharge Instructions (Signed)
As we discussed today, your work-up was reassuring.  Your CT scan did show that you had a large amount of stool.  This may likely be irritating some internal hemorrhoids that you have that were seen on her colonoscopy that could have caused the rectal bleeding.  He did not have any signs of rectal bleeding here today.  Your blood levels were stable.  Take stool softeners as directed as long as you take the pain medication.  Follow-up with your GI doctor.  Return emergency department for any worsening rectal bleeding, abdominal pain, passing out or any other worsening or concerning symptoms.

## 2019-12-01 NOTE — ED Triage Notes (Signed)
Patient states she had a tooth pulled 4 days ago and was prescribed Penicillin and Hydrocodone.  Patient states she noticed dark colored blood in the water of the toilet. Patient also c/o LLQ pain.

## 2019-12-01 NOTE — ED Notes (Signed)
Lab spilled pt's UA. Resending now.

## 2019-12-01 NOTE — ED Notes (Signed)
Patient reports 7/10 pain level and requesting more pain medication,  Mendel Ryder, [place] made aware.

## 2019-12-02 ENCOUNTER — Telehealth: Payer: Self-pay

## 2019-12-02 ENCOUNTER — Telehealth: Payer: Self-pay | Admitting: Nurse Practitioner

## 2019-12-02 NOTE — Telephone Encounter (Signed)
Yes it is fine.  Thanks

## 2019-12-02 NOTE — Telephone Encounter (Signed)
Kimberly Pole, MD sent to [name], LPN  Schedule follow up office visit next available  Thanks   Seen in the ED 12/01/19 Spoke with the patient. She has been told to take Colace for constipation by the ED provider. Appointment scheduled with Tye Savoy, [facility]

## 2019-12-02 NOTE — Telephone Encounter (Signed)
Patient is calling in reference to her upcoming appt refused to provide me with more information to help further.

## 2019-12-02 NOTE — Telephone Encounter (Signed)
Records faxed with cover sheet indicating she wants a doctor in [place].

## 2019-12-02 NOTE — Telephone Encounter (Signed)
Patient is "missed so much work." She is tearful. Asks if she can have a referral to [facility]. She thinks this would be easier for her.  Okay to help her with this?

## 2019-12-08 ENCOUNTER — Telehealth: Payer: Self-pay | Admitting: Gastroenterology

## 2019-12-08 NOTE — Telephone Encounter (Signed)
Confirmed the patient is transferring her care. It is not a referral for a procedure.

## 2019-12-08 NOTE — Telephone Encounter (Signed)
Quillian Quince from [facility] is calling looking for an order from Dr. Silverio Decamp

## 2019-12-09 ENCOUNTER — Ambulatory Visit: Payer: 59 | Admitting: Nurse Practitioner

## 2019-12-31 ENCOUNTER — Ambulatory Visit: Payer: 59 | Admitting: Obstetrics and Gynecology

## 2020-04-15 ENCOUNTER — Telehealth: Payer: Self-pay | Admitting: Gastroenterology

## 2020-04-15 NOTE — Telephone Encounter (Signed)
Spoke with the patient. She did not transfer her care as she had previously planned.  Continues to have reflux issues. Her PCP had told her about a "surgery where they could pin my stomach down."  She is interested in discussing her symptoms and finding out if this is something that would be an option for her. Appointment scheduled with the patient.

## 2020-06-02 ENCOUNTER — Other Ambulatory Visit: Payer: Self-pay | Admitting: Internal Medicine

## 2020-06-02 DIAGNOSIS — Z1231 Encounter for screening mammogram for malignant neoplasm of breast: Secondary | ICD-10-CM

## 2020-06-16 ENCOUNTER — Ambulatory Visit: Payer: 59 | Admitting: Gastroenterology

## 2020-07-13 ENCOUNTER — Ambulatory Visit: Payer: 59

## 2020-09-10 ENCOUNTER — Ambulatory Visit: Payer: 59

## 2020-10-17 IMAGING — CT CT ABD-PELV W/ CM
2 of 5 series · 15 of 46 positions shown, 17 images · IV contrast (OMNIPAQUE 300)
Comparison: March 23, 2019

CLINICAL DATA: Left lower quadrant abdominal pain

EXAM:
CT ABDOMEN AND PELVIS WITH CONTRAST
TECHNIQUE: Multidetector CT imaging of the abdomen and pelvis was performed
using the standard protocol following bolus administration of
intravenous contrast.
CONTRAST:  100mL OMNIPAQUE IOHEXOL 300 MG/ML  SOLN

[Series 2: axial st · axial · 0.62mm/px · z∈[+986,+1346]mm · 12 of 86 slices shown, 14 images]
[im 7/86  soft-tissue]
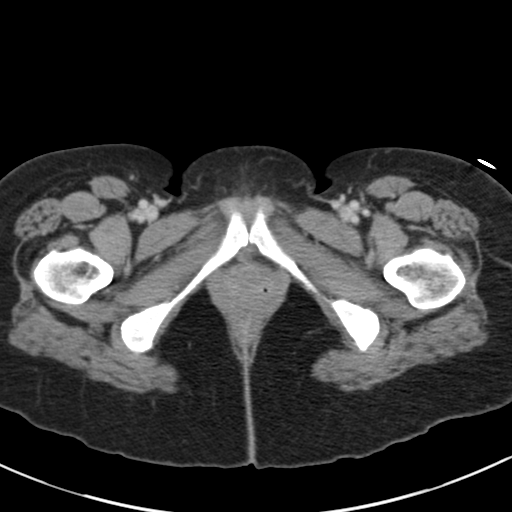
[im 7/86  bone]
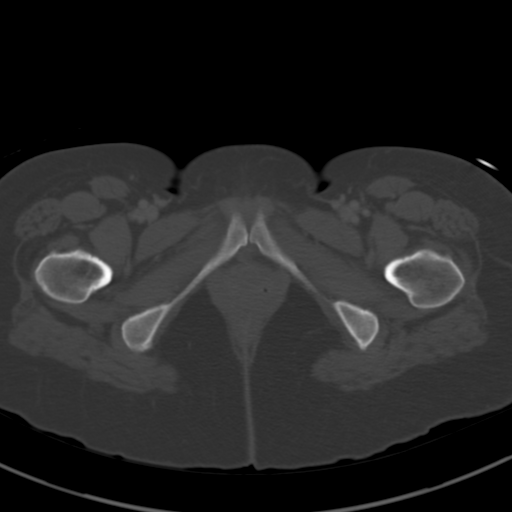
[im 13/86  soft-tissue]
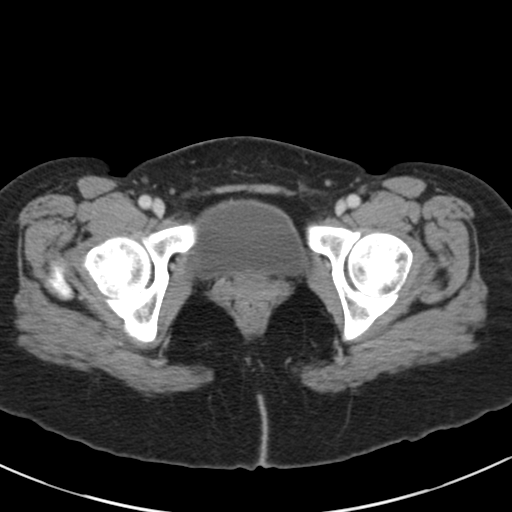
[im 19/86  soft-tissue]
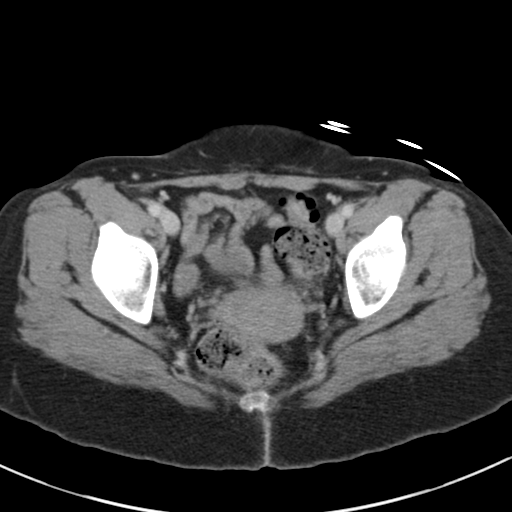
[im 25/86  soft-tissue]
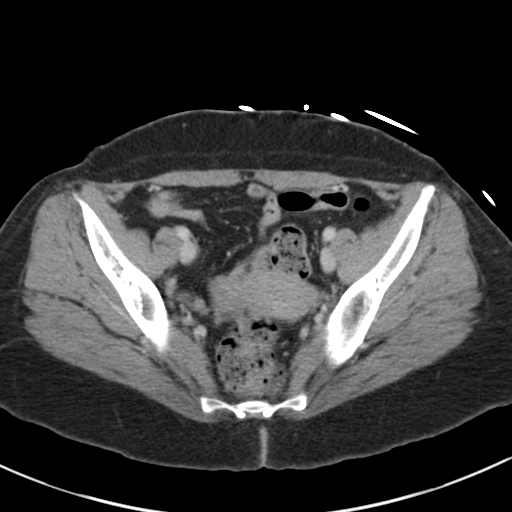
[im 31/86  soft-tissue]
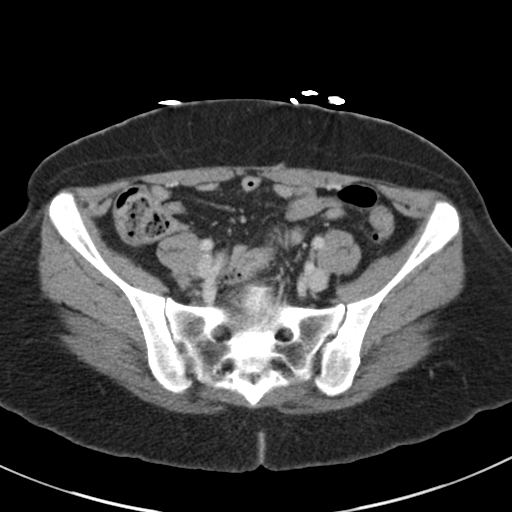
[im 37/86  soft-tissue]
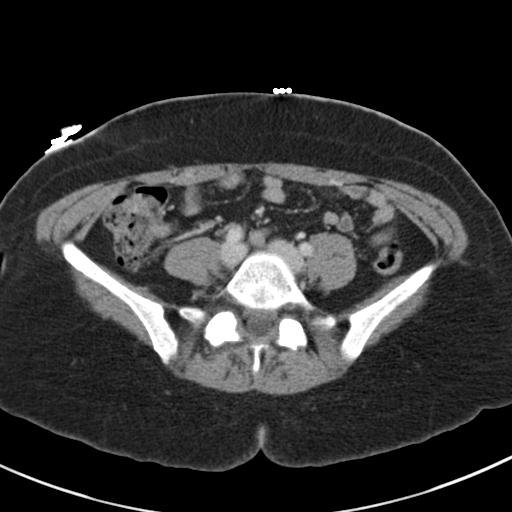
[im 49/86  soft-tissue]
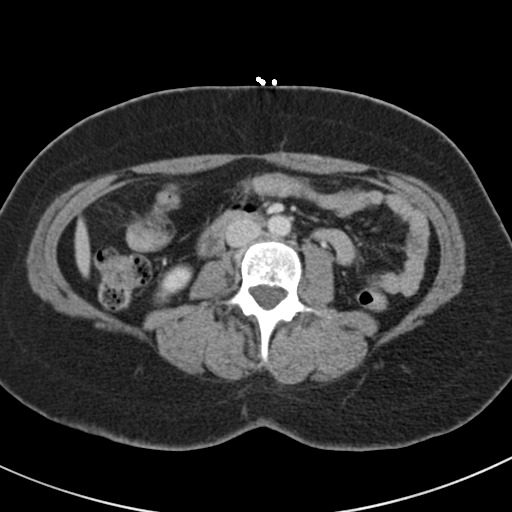
[im 55/86  soft-tissue]
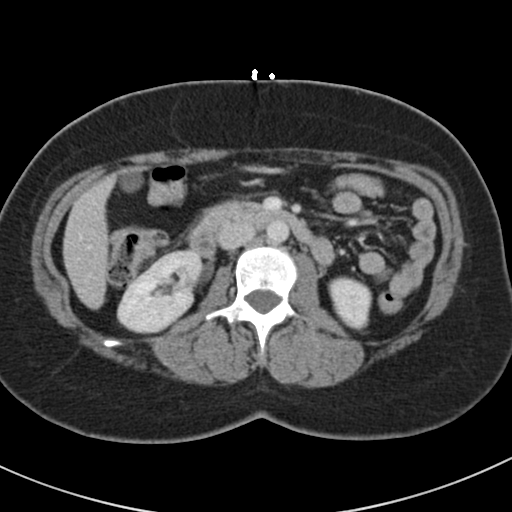
[im 61/86  soft-tissue]
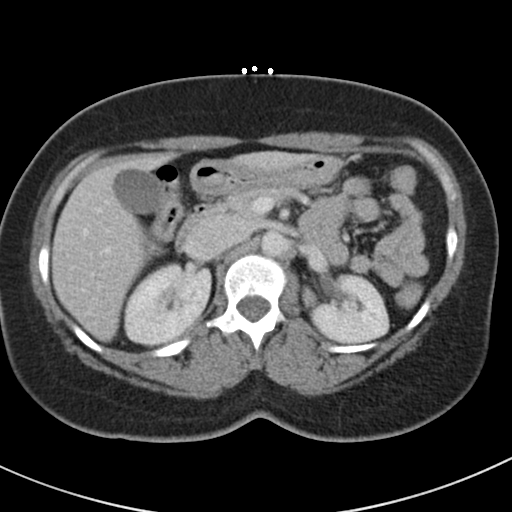
[im 61/86  bone]
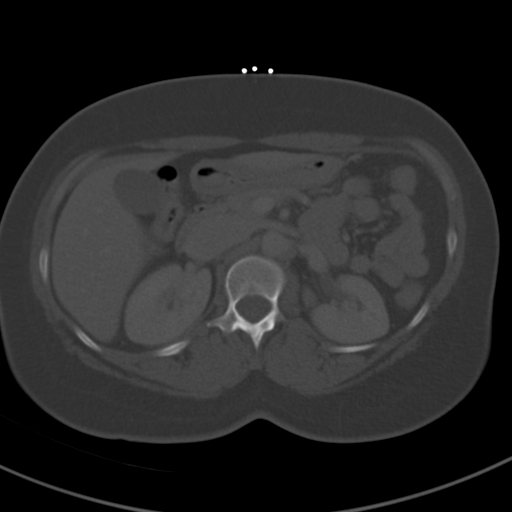
[im 67/86  soft-tissue]
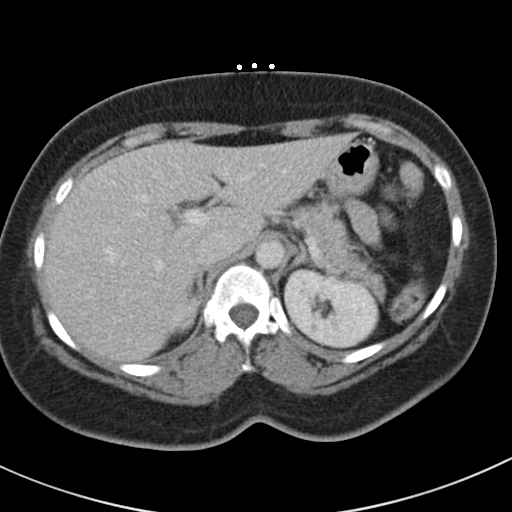
[im 73/86  soft-tissue]
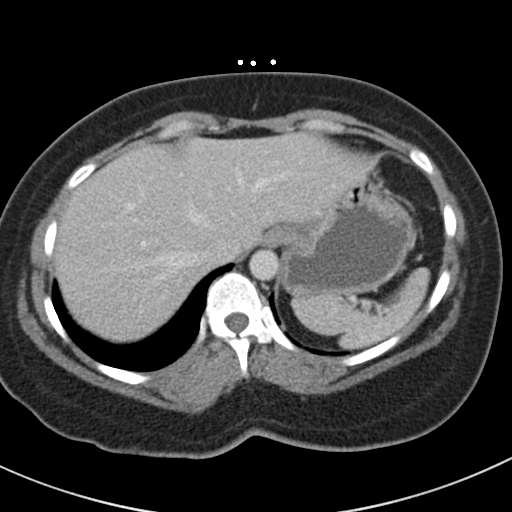
[im 79/86  soft-tissue]
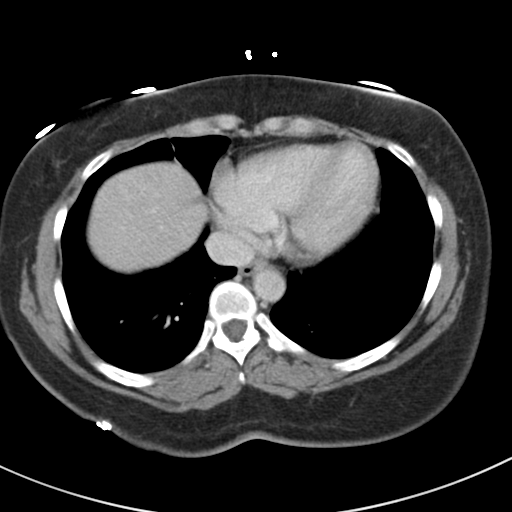

[Series 5: coronal st · coronal · 0.57mm/px · 3 of 98 slices shown]
[im 33/98  soft-tissue]
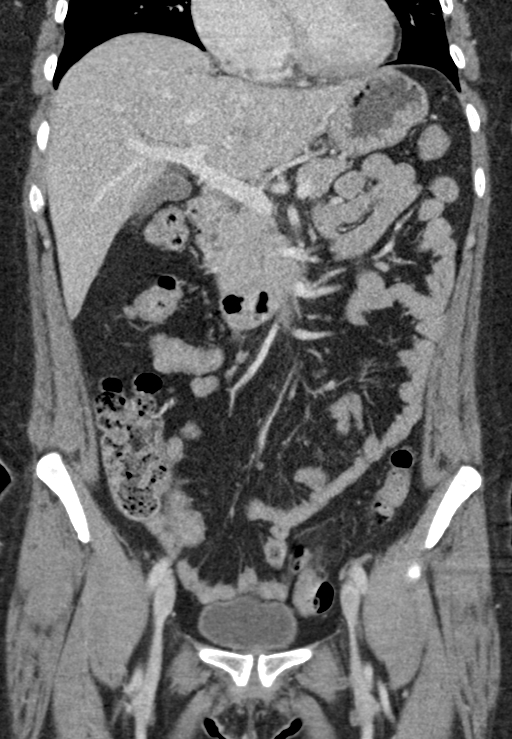
[im 44/98  soft-tissue]
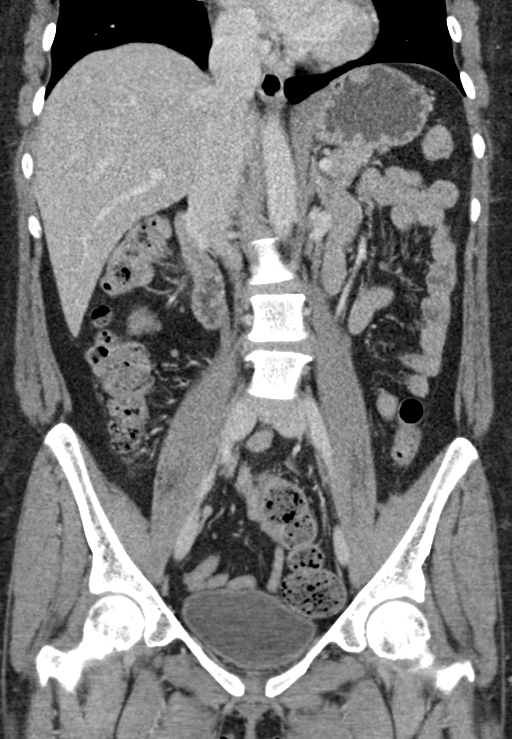
[im 54/98  soft-tissue]
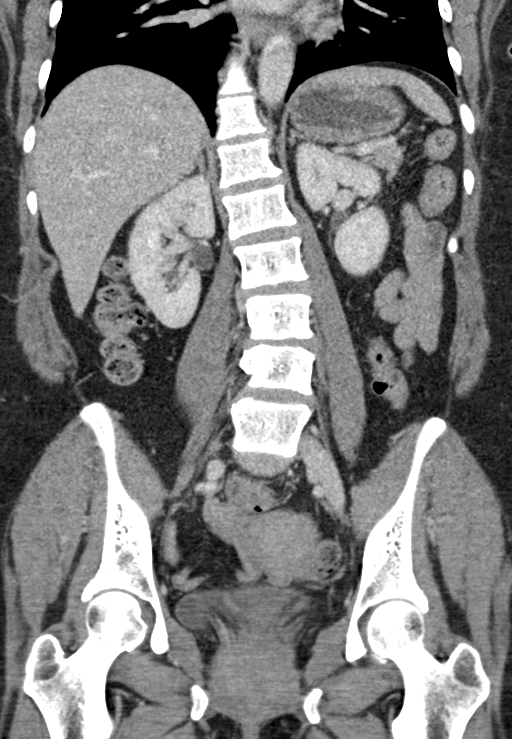

[15 of 46 positions shown; findings below may reference images not displayed]

FINDINGS: Lower chest: The visualized heart size within normal limits. No
pericardial fluid/thickening.

No hiatal hernia.

Streaky atelectasis seen at both lung bases.

Hepatobiliary: The liver is normal in density without focal
abnormality.The main portal vein is patent. No evidence of calcified
gallstones, gallbladder wall thickening or biliary dilatation.

Pancreas: Unremarkable. No pancreatic ductal dilatation or
surrounding inflammatory changes.

Spleen: Normal in size without focal abnormality.

Adrenals/Urinary Tract: Both adrenal glands appear normal.
Low-density lesions are seen in both upper poles of the kidneys the
largest measuring 1.5 cm in the upper pole of right kidney which is
unchanged from prior exam. No renal or collecting system calculi.
The bladder is unremarkable.

Stomach/Bowel: The stomach, small bowel, and colon are normal in
appearance. No inflammatory changes, wall thickening, or obstructive
findings.A moderate to large amount of colonic stool seen at the
sigmoid rectal junction. The appendix is normal in appearance.

Vascular/Lymphatic: There are no enlarged mesenteric,
retroperitoneal, or pelvic lymph nodes. No significant vascular
findings are present.

Reproductive: Lobular heterogeneous nodular appearance to the
posterior uterus is seen, likely fibroids. The adnexa are
unremarkable.

Other: No evidence of abdominal wall mass or hernia.

Musculoskeletal: No acute or significant osseous findings.
IMPRESSION: Moderate to large amount of colonic stool. No other acute
intra-abdominal pathology to explain the patient's symptoms.

Probable fibroid uterus.

## 2021-03-25 ENCOUNTER — Other Ambulatory Visit: Payer: Self-pay | Admitting: Internal Medicine

## 2021-03-25 DIAGNOSIS — E2839 Other primary ovarian failure: Secondary | ICD-10-CM

## 2021-06-22 ENCOUNTER — Other Ambulatory Visit: Payer: Self-pay

## 2021-06-22 ENCOUNTER — Other Ambulatory Visit: Payer: Self-pay | Admitting: Internal Medicine

## 2021-06-22 ENCOUNTER — Ambulatory Visit
Admission: RE | Admit: 2021-06-22 | Discharge: 2021-06-22 | Disposition: A | Discharge status: Home / Self Care | Payer: 59 | Source: Ambulatory Visit | Attending: Internal Medicine | Admitting: Internal Medicine

## 2021-06-22 DIAGNOSIS — D259 Leiomyoma of uterus, unspecified: Secondary | ICD-10-CM

## 2021-06-23 LAB — CBC
HCT: 38.3 % (ref 35.0–45.0)
Hemoglobin: 12.2 g/dL (ref 11.7–15.5)
MCH: 31 pg (ref 27.0–33.0)
MCHC: 31.9 g/dL — ABNORMAL LOW (ref 32.0–36.0)
MCV: 97.5 fL (ref 80.0–100.0)
MPV: 9.2 fL (ref 7.5–12.5)
Platelets: 403 10*3/uL — ABNORMAL HIGH (ref 140–400)
RBC: 3.93 10*6/uL (ref 3.80–5.10)
RDW: 12.4 % (ref 11.0–15.0)
WBC: 4.7 10*3/uL (ref 3.8–10.8)

## 2021-06-23 LAB — COMPLETE METABOLIC PANEL WITH GFR
AG Ratio: 1.2 (calc) (ref 1.0–2.5)
ALT: 6 U/L (ref 6–29)
AST: 16 U/L (ref 10–35)
Albumin: 4.4 g/dL (ref 3.6–5.1)
Alkaline phosphatase (APISO): 95 U/L (ref 37–153)
BUN: 16 mg/dL (ref 7–25)
CO2: 26 mmol/L (ref 20–32)
Calcium: 9.7 mg/dL (ref 8.6–10.4)
Chloride: 102 mmol/L (ref 98–110)
Creat: 0.82 mg/dL (ref 0.50–1.03)
Globulin: 3.6 g/dL (calc) (ref 1.9–3.7)
Glucose, Bld: 83 mg/dL (ref 65–99)
Potassium: 4.3 mmol/L (ref 3.5–5.3)
Sodium: 139 mmol/L (ref 135–146)
Total Bilirubin: 0.4 mg/dL (ref 0.2–1.2)
Total Protein: 8 g/dL (ref 6.1–8.1)
eGFR: 87 mL/min/{1.73_m2} (ref >=60)

## 2021-06-23 LAB — LIPID PANEL
Cholesterol: 226 mg/dL — ABNORMAL HIGH (ref <200)
HDL: 52 mg/dL (ref >=50)
LDL Cholesterol (Calc): 151 mg/dL (calc) — ABNORMAL HIGH
Non-HDL Cholesterol (Calc): 174 mg/dL (calc) — ABNORMAL HIGH (ref <130)
Total CHOL/HDL Ratio: 4.3 (calc) (ref <5.0)
Triglycerides: 114 mg/dL (ref <150)

## 2021-06-23 LAB — VITAMIN D 25 HYDROXY (VIT D DEFICIENCY, FRACTURES): Vit D, 25-Hydroxy: 19 ng/mL — ABNORMAL LOW (ref 30–100)

## 2021-06-23 LAB — TSH: TSH: 0.86 mIU/L

## 2021-07-28 ENCOUNTER — Encounter: Payer: Self-pay | Admitting: Obstetrics and Gynecology

## 2021-07-28 ENCOUNTER — Ambulatory Visit (INDEPENDENT_AMBULATORY_CARE_PROVIDER_SITE_OTHER): Payer: 59 | Admitting: Obstetrics and Gynecology

## 2021-07-28 ENCOUNTER — Other Ambulatory Visit (HOSPITAL_COMMUNITY)
Admission: RE | Admit: 2021-07-28 | Discharge: 2021-07-28 | Disposition: A | Discharge status: Home / Self Care | Payer: 59 | Source: Ambulatory Visit | Attending: Obstetrics and Gynecology | Admitting: Obstetrics and Gynecology

## 2021-07-28 ENCOUNTER — Encounter: Payer: Self-pay | Admitting: *Deleted

## 2021-07-28 ENCOUNTER — Other Ambulatory Visit: Payer: Self-pay

## 2021-07-28 DIAGNOSIS — Z01419 Encounter for gynecological examination (general) (routine) without abnormal findings: Secondary | ICD-10-CM | POA: Diagnosis not present

## 2021-07-28 DIAGNOSIS — Z9079 Acquired absence of other genital organ(s): Secondary | ICD-10-CM | POA: Diagnosis not present

## 2021-07-28 DIAGNOSIS — K469 Unspecified abdominal hernia without obstruction or gangrene: Secondary | ICD-10-CM | POA: Insufficient documentation

## 2021-07-28 DIAGNOSIS — R102 Pelvic and perineal pain: Secondary | ICD-10-CM | POA: Diagnosis not present

## 2021-07-28 DIAGNOSIS — Z90721 Acquired absence of ovaries, unilateral: Secondary | ICD-10-CM

## 2021-07-28 DIAGNOSIS — D219 Benign neoplasm of connective and other soft tissue, unspecified: Secondary | ICD-10-CM

## 2021-07-28 NOTE — Progress Notes (Signed)
Reports pain and pressure LLQ abd and L flank. No vaginal bleeding.  01/30/2014 Dr. Ronita Hipps performed surgery. See op notes.

## 2021-07-28 NOTE — Patient Instructions (Signed)
Health Maintenance, Female Adopting a healthy lifestyle and getting preventive care are important in promoting health and wellness. Ask your health care provider about: The right schedule for you to have regular tests and exams. Things you can do on your own to prevent diseases and keep yourself healthy. What should I know about diet, weight, and exercise? Eat a healthy diet  Eat a diet that includes plenty of vegetables, fruits, low-fat dairy products, and lean protein. Do not eat a lot of foods that are high in solid fats, added sugars, or sodium. Maintain a healthy weight Body mass index (BMI) is used to identify weight problems. It estimates body fat based on height and weight. Your health care provider can help determine your BMI and help you achieve or maintain a healthy weight. Get regular exercise Get regular exercise. This is one of the most important things you can do for your health. Most adults should: Exercise for at least 150 minutes each week. The exercise should increase your heart rate and make you sweat (moderate-intensity exercise). Do strengthening exercises at least twice a week. This is in addition to the moderate-intensity exercise. Spend less time sitting. Even light physical activity can be beneficial. Watch cholesterol and blood lipids Have your blood tested for lipids and cholesterol at 51 years of age, then have this test every 5 years. Have your cholesterol levels checked more often if: Your lipid or cholesterol levels are high. You are older than 51 years of age. You are at high risk for heart disease. What should I know about cancer screening? Depending on your health history and family history, you may need to have cancer screening at various ages. This may include screening for: Breast cancer. Cervical cancer. Colorectal cancer. Skin cancer. Lung cancer. What should I know about heart disease, diabetes, and high blood pressure? Blood pressure and heart  disease High blood pressure causes heart disease and increases the risk of stroke. This is more likely to develop in people who have high blood pressure readings, are of African descent, or are overweight. Have your blood pressure checked: Every 3-5 years if you are 18-39 years of age. Every year if you are 40 years old or older. Diabetes Have regular diabetes screenings. This checks your fasting blood sugar level. Have the screening done: Once every three years after age 40 if you are at a normal weight and have a low risk for diabetes. More often and at a younger age if you are overweight or have a high risk for diabetes. What should I know about preventing infection? Hepatitis B If you have a higher risk for hepatitis B, you should be screened for this virus. Talk with your health care provider to find out if you are at risk for hepatitis B infection. Hepatitis C Testing is recommended for: Everyone born from 1945 through 1965. Anyone with known risk factors for hepatitis C. Sexually transmitted infections (STIs) Get screened for STIs, including gonorrhea and chlamydia, if: You are sexually active and are younger than 51 years of age. You are older than 51 years of age and your health care provider tells you that you are at risk for this type of infection. Your sexual activity has changed since you were last screened, and you are at increased risk for chlamydia or gonorrhea. Ask your health care provider if you are at risk. Ask your health care provider about whether you are at high risk for HIV. Your health care provider may recommend a prescription medicine   to help prevent HIV infection. If you choose to take medicine to prevent HIV, you should first get tested for HIV. You should then be tested every 3 months for as long as you are taking the medicine. Pregnancy If you are about to stop having your period (premenopausal) and you may become pregnant, seek counseling before you get  pregnant. Take 400 to 800 micrograms (mcg) of folic acid every day if you become pregnant. Ask for birth control (contraception) if you want to prevent pregnancy. Osteoporosis and menopause Osteoporosis is a disease in which the bones lose minerals and strength with aging. This can result in bone fractures. If you are 65 years old or older, or if you are at risk for osteoporosis and fractures, ask your health care provider if you should: Be screened for bone loss. Take a calcium or vitamin D supplement to lower your risk of fractures. Be given hormone replacement therapy (HRT) to treat symptoms of menopause. Follow these instructions at home: Lifestyle Do not use any products that contain nicotine or tobacco, such as cigarettes, e-cigarettes, and chewing tobacco. If you need help quitting, ask your health care provider. Do not use street drugs. Do not share needles. Ask your health care provider for help if you need support or information about quitting drugs. Alcohol use Do not drink alcohol if: Your health care provider tells you not to drink. You are pregnant, may be pregnant, or are planning to become pregnant. If you drink alcohol: Limit how much you use to 0-1 drink a day. Limit intake if you are breastfeeding. Be aware of how much alcohol is in your drink. In the [place] one drink equals one 12 oz bottle of beer (355 mL), one 5 oz glass of wine (148 mL), or one 1 oz glass of hard liquor (44 mL). General instructions Schedule regular health, dental, and eye exams. Stay current with your vaccines. Tell your health care provider if: You often feel depressed. You have ever been abused or do not feel safe at home. Summary Adopting a healthy lifestyle and getting preventive care are important in promoting health and wellness. Follow your health care provider's instructions about healthy diet, exercising, and getting tested or screened for diseases. Follow your health care provider's  instructions on monitoring your cholesterol and blood pressure. This information is not intended to replace advice given to you by your health care provider. Make sure you discuss any questions you have with your health care provider. Document Revised: 12/17/2020 Document Reviewed: 10/02/2018 Elsevier Patient Education  2022 Elsevier Inc.  

## 2021-07-28 NOTE — Progress Notes (Signed)
Kimberly Bruce is a 50 y.o. G22P4004 female here for a routine annual gynecologic exam.  Current complaints: Left flank/abd/pelvic pain since 2018.  H/O Robotic surgery by Dr Ronita Hipps 01/2014.  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  Describes pain as dull and achy. Notices usually at the end of the day. Denies any bowel or bladder dysfunction. Not sexual active. Cycles stopped in 2018 . Denies any vaginal bleeding or menopausal Sx.  GYN U/S 06/22/21 small uterine fibroid, no left adnexal abnormalities noted, unable to see right ovary.  Gynecologic History Patient's last menstrual period was 04/12/2017. Contraception: abstinence Last Pap: Uncertain.  Last mammogram: Uncertain.   Obstetric History OB History  Gravida Para Term Preterm AB Living  4 4 4  0 0 4  SAB IAB Ectopic Multiple Live Births  0 0 0 0 4    # Outcome Date GA Lbr Len/2nd Weight Sex Delivery Anes PTL Lv  4 Term           3 Term           2 Term           1 Term             Past Medical History:  Diagnosis Date   Anemia    since teenage years   Anxiety 1996   Blood transfusion without reported diagnosis    Hiatal hernia    Insomnia    Ovarian cyst    Upper GI bleed     Past Surgical History:  Procedure Laterality Date   BIOPSY  11/04/2019   Procedure: BIOPSY;  Surgeon: Mauri Pole, MD;  Location: [facility];  Service: Endoscopy;;   CESAREAN SECTION     x2    COLONOSCOPY WITH PROPOFOL N/A 11/04/2019   Procedure: COLONOSCOPY WITH PROPOFOL;  Surgeon: Mauri Pole, MD;  Location: [facility];  Service: Endoscopy;  Laterality: N/A;   ESOPHAGOGASTRODUODENOSCOPY N/A 02/21/2016   Procedure: ESOPHAGOGASTRODUODENOSCOPY (EGD);  Surgeon: Mauri Pole, MD;  Location: [facility];  Service: Endoscopy;  Laterality: N/A;   ESOPHAGOGASTRODUODENOSCOPY (EGD) WITH PROPOFOL N/A 03/22/2019   Procedure: ESOPHAGOGASTRODUODENOSCOPY (EGD) WITH PROPOFOL;  Surgeon: Irene Shipper, MD;   Location: [facility];  Service: Endoscopy;  Laterality: N/A;  10am EGD with Dr. Henrene Pastor    ESOPHAGOGASTRODUODENOSCOPY (EGD) WITH PROPOFOL N/A 11/04/2019   Procedure: ESOPHAGOGASTRODUODENOSCOPY (EGD) WITH PROPOFOL;  Surgeon: Mauri Pole, MD;  Location: [facility];  Service: Endoscopy;  Laterality: N/A;  difficult IV access   HEMOSTASIS CONTROL  03/22/2019   Procedure: HEMOSTASIS CONTROL;  Surgeon: Irene Shipper, MD;  Location: [facility];  Service: Endoscopy;;   HOT HEMOSTASIS N/A 03/22/2019   Procedure: HOT HEMOSTASIS (ARGON PLASMA COAGULATION/BICAP);  Surgeon: Irene Shipper, MD;  Location: [facility];  Service: Endoscopy;  Laterality: N/A;   LAPAROSCOPY N/A 01/30/2014   Procedure: LAPAROSCOPY DIAGNOSTIC;  Surgeon: Lovenia Kim, MD;  Location: [facility];  Service: Gynecology;  Laterality: N/A;   ROBOTIC ASSISTED LAPAROSCOPIC LYSIS OF ADHESION N/A 01/30/2014   Procedure: Possible ROBOTIC ASSISTED LAPAROSCOPIC LYSIS OF ADHESION WITH LEFT SALPINGO OOPHORECTOMY;  Surgeon: Lovenia Kim, MD;  Location: [facility];  Service: Gynecology;  Laterality: N/A;   WISDOM TOOTH EXTRACTION      Current Outpatient Medications on File Prior to Visit  Medication Sig Dispense Refill   zolpidem (AMBIEN) 10 MG tablet Take 10 mg by mouth at bedtime.     No current facility-administered medications on file prior  to visit.    Allergies  Allergen Reactions   Citalopram Nausea And Vomiting    Social History   Socioeconomic History   Marital status: Single    Spouse name: Not on file   Number of children: Not on file   Years of education: Not on file   Highest education level: Not on file  Occupational History   Not on file  Tobacco Use   Smoking status: Never   Smokeless tobacco: Never  Vaping Use   Vaping Use: Never used  Substance and Sexual Activity   Alcohol use: Not Currently    Alcohol/week: 0.0 standard drinks    Comment: occasionally   Drug use: No   Sexual activity: Yes    Birth  control/protection: None  Other Topics Concern   Not on file  Social History Narrative   Her 51yo daughter [name] lives with her, [name], works as Employment IT sales professional at [employer], exercising 5 days per week.  Likes to do kickboxing.  In relationship 2 years.    Social Determinants of Health   Financial Resource Strain: Not on file  Food Insecurity: Not on file  Transportation Needs: Not on file  Physical Activity: Not on file  Stress: Not on file  Social Connections: Not on file  Intimate Partner Violence: Not on file    Family History  Problem Relation Age of Onset   Aneurysm Father        brain   Hepatitis C Father    Breast cancer Maternal Aunt        breast   Cancer Maternal Uncle        prostate?   Heart disease Paternal Grandmother    Alcohol abuse Maternal Grandmother    Other Paternal Grandfather        unknown   Lung cancer Sister 100       died   Colon cancer Neg Hx    Esophageal cancer Neg Hx    Stomach cancer Neg Hx    Rectal cancer Neg Hx    Colon polyps Neg Hx     The following portions of the patient's history were reviewed and updated as appropriate: allergies, current medications, past family history, past medical history, past social history, past surgical history and problem list.  Review of Systems Pertinent items noted in HPI and remainder of comprehensive ROS otherwise negative.   Objective:  BP (!) 146/89   Pulse (!) 102   Ht 4\' 11"  (1.499 m)   Wt 152 lb 11.2 oz (69.3 kg)   LMP 04/12/2017   BMI 30.84 kg/m   Chaperone present during today's exam CONSTITUTIONAL: Well-developed, well-nourished female in no acute distress.  HENT:  Normocephalic, atraumatic, External right and left ear normal. Oropharynx is clear and moist EYES: Conjunctivae and EOM are normal. Pupils are equal, round, and reactive to light. No scleral icterus.  NECK: Normal range of motion, supple, no masses.  Normal thyroid.  SKIN:  Skin is warm and dry. No rash noted. Not diaphoretic. No erythema. No pallor. [place]: Alert and oriented to person, place, and time. Normal reflexes, muscle tone coordination. No cranial nerve deficit noted. PSYCHIATRIC: Normal mood and affect. Normal behavior. Normal judgment and thought content. CARDIOVASCULAR: Normal heart rate noted, regular rhythm RESPIRATORY: Clear to auscultation bilaterally. Effort and breath sounds normal, no problems with respiration noted. BREASTS: Symmetric in size. No masses, skin changes, nipple drainage, or lymphadenopathy. ABDOMEN: Soft, normal bowel sounds, no distention noted.  No  tenderness, rebound or guarding.  PELVIC: Normal appearing external genitalia; normal appearing vaginal mucosa and cervix.  No abnormal discharge noted.  Pap smear obtained.  Normal uterine size, no other palpable masses, no uterine or adnexal tenderness. MUSCULOSKELETAL: Normal range of motion. No tenderness.  No cyanosis, clubbing, or edema.  2+ distal pulses.   Assessment:  Annual gynecologic examination with pap smear Pelvic pain Plan:  Will follow up results of pap smear and manage accordingly. Mammogram scheduled. No focal GYN source of pt's pain identified on today's exam. Suspect may be related to her pelvic adhesions. Possible left hip as well. Discussed with pt, very limited treatment options from a GYN standpoint. GYN PT may help. She will let us know about that. PCP to evaluated left hip if deemed necessary. OTC NSAIDs as needed. F/U per pap smear and mammogram results.  Routine preventative health maintenance measures emphasized. Please refer to After Visit Summary for other counseling recommendations.    Chancy Milroy, MD, [place] Attending [place] for [facility], [facility]

## 2021-08-01 LAB — CYTOLOGY - PAP
Comment: NEGATIVE
Diagnosis: NEGATIVE
High risk HPV: NEGATIVE

## 2022-05-16 ENCOUNTER — Emergency Department (HOSPITAL_COMMUNITY)
Admission: EM | Admit: 2022-05-16 | Discharge: 2022-05-17 | Discharge status: Against Medical Advice | Payer: 59 | Attending: Emergency Medicine | Admitting: Emergency Medicine

## 2022-05-16 ENCOUNTER — Emergency Department (HOSPITAL_COMMUNITY): Payer: 59

## 2022-05-16 ENCOUNTER — Encounter (HOSPITAL_COMMUNITY): Payer: Self-pay | Admitting: Emergency Medicine

## 2022-05-16 ENCOUNTER — Other Ambulatory Visit: Payer: Self-pay

## 2022-05-16 DIAGNOSIS — R55 Syncope and collapse: Secondary | ICD-10-CM | POA: Diagnosis not present

## 2022-05-16 DIAGNOSIS — Z5321 Procedure and treatment not carried out due to patient leaving prior to being seen by health care provider: Secondary | ICD-10-CM | POA: Insufficient documentation

## 2022-05-16 DIAGNOSIS — R630 Anorexia: Secondary | ICD-10-CM | POA: Diagnosis not present

## 2022-05-16 LAB — CBC WITH DIFFERENTIAL/PLATELET
Abs Immature Granulocytes: 0.01 10*3/uL (ref 0.00–0.07)
Basophils Absolute: 0 10*3/uL (ref 0.0–0.1)
Basophils Relative: 0 %
Eosinophils Absolute: 0.2 10*3/uL (ref 0.0–0.5)
Eosinophils Relative: 3 %
HCT: 34.8 % — ABNORMAL LOW (ref 36.0–46.0)
Hemoglobin: 11.2 g/dL — ABNORMAL LOW (ref 12.0–15.0)
Immature Granulocytes: 0 %
Lymphocytes Relative: 35 %
Lymphs Abs: 1.7 10*3/uL (ref 0.7–4.0)
MCH: 31.4 pg (ref 26.0–34.0)
MCHC: 32.2 g/dL (ref 30.0–36.0)
MCV: 97.5 fL (ref 80.0–100.0)
Monocytes Absolute: 0.4 10*3/uL (ref 0.1–1.0)
Monocytes Relative: 8 %
Neutro Abs: 2.6 10*3/uL (ref 1.7–7.7)
Neutrophils Relative %: 54 %
Platelets: 420 10*3/uL — ABNORMAL HIGH (ref 150–400)
RBC: 3.57 MIL/uL — ABNORMAL LOW (ref 3.87–5.11)
RDW: 12.5 % (ref 11.5–15.5)
WBC: 4.9 10*3/uL (ref 4.0–10.5)
nRBC: 0 % (ref 0.0–0.2)

## 2022-05-16 LAB — COMPREHENSIVE METABOLIC PANEL
ALT: 6 U/L (ref 0–44)
AST: 21 U/L (ref 15–41)
Albumin: 4.5 g/dL (ref 3.5–5.0)
Alkaline Phosphatase: 91 U/L (ref 38–126)
Anion gap: 9 (ref 5–15)
BUN: 7 mg/dL (ref 6–20)
CO2: 27 mmol/L (ref 22–32)
Calcium: 9.4 mg/dL (ref 8.9–10.3)
Chloride: 105 mmol/L (ref 98–111)
Creatinine, Ser: 0.78 mg/dL (ref 0.44–1.00)
GFR, Estimated: >60 mL/min (ref >60)
Glucose, Bld: 85 mg/dL (ref 70–99)
Potassium: 3.8 mmol/L (ref 3.5–5.1)
Sodium: 141 mmol/L (ref 135–145)
Total Bilirubin: 0.6 mg/dL (ref 0.3–1.2)
Total Protein: 8.1 g/dL (ref 6.5–8.1)

## 2022-05-16 LAB — URINALYSIS, ROUTINE W REFLEX MICROSCOPIC
Bilirubin Urine: NEGATIVE
Glucose, UA: NEGATIVE mg/dL
Ketones, ur: NEGATIVE mg/dL
Leukocytes,Ua: NEGATIVE
Nitrite: NEGATIVE
Protein, ur: NEGATIVE mg/dL
Specific Gravity, Urine: 1.004 — ABNORMAL LOW (ref 1.005–1.030)
pH: 7 (ref 5.0–8.0)

## 2022-05-16 LAB — TROPONIN I (HIGH SENSITIVITY): Troponin I (High Sensitivity): 4 ng/L (ref <18)

## 2022-05-16 LAB — PROTIME-INR
INR: 1.1 (ref 0.8–1.2)
Prothrombin Time: 13.7 seconds (ref 11.4–15.2)

## 2022-05-16 NOTE — ED Triage Notes (Signed)
Pt reports near syncopal while at work. Pt has been outside walking around a lot. Not eating and drinking per normal. 138/92, hr6 cbg 104

## 2022-05-16 NOTE — ED Provider Triage Note (Signed)
Emergency Medicine Provider Triage Evaluation Note  Kimberly Bruce , a 51 y.o. female  was evaluated in triage.  Pt complains of multiple episodes of near syncope. She states that she has had multiple episodes of near syncope today.  Including her most recent one when she was seated.  She states she has had poor appetite recently.  Denies any fevers.  No injuries.  No pain in her head, chest, or abdomen.  She feels generally unwell.  She states when she has had similar in the past she has had bleeding gastric ulcers.  Physical Exam  BP (!) 144/92 (BP Location: Right Arm)   Pulse 94   Temp 97.9 F (36.6 C) (Oral)   Resp 18   Ht '4\' 11"'$  (1.499 m)   Wt 72.6 kg   LMP 04/12/2017   SpO2 99%   BMI 32.32 kg/m  Gen:   Awake, no distress   Resp:  Normal effort  MSK:   Moves extremities without difficulty  Other:  Normal speech  Medical Decision Making  Medically screening exam initiated at 7:26 PM.  Appropriate orders placed.  Kimberly Bruce was informed that the remainder of the evaluation will be completed by another provider, this initial triage assessment does not replace that evaluation, and the importance of remaining in the ED until their evaluation is complete.     Lorin Glass, [place] 05/16/22 [id number]

## 2022-05-17 LAB — TROPONIN I (HIGH SENSITIVITY): Troponin I (High Sensitivity): 3 ng/L (ref <18)

## 2022-05-17 NOTE — ED Notes (Signed)
X3 no response

## 2022-05-17 NOTE — ED Notes (Signed)
Pt called for X2. Pt could not be found.

## 2022-07-24 ENCOUNTER — Other Ambulatory Visit: Payer: Self-pay | Admitting: Obstetrics and Gynecology

## 2022-07-24 DIAGNOSIS — Z01419 Encounter for gynecological examination (general) (routine) without abnormal findings: Secondary | ICD-10-CM

## 2023-01-11 ENCOUNTER — Emergency Department (HOSPITAL_COMMUNITY)
Admission: EM | Admit: 2023-01-11 | Discharge: 2023-01-12 | Disposition: A | Discharge status: Home / Self Care | Payer: 59 | Attending: Emergency Medicine | Admitting: Emergency Medicine

## 2023-01-11 ENCOUNTER — Encounter (HOSPITAL_COMMUNITY): Payer: Self-pay

## 2023-01-11 DIAGNOSIS — K2980 Duodenitis without bleeding: Secondary | ICD-10-CM | POA: Diagnosis not present

## 2023-01-11 DIAGNOSIS — R109 Unspecified abdominal pain: Secondary | ICD-10-CM | POA: Diagnosis present

## 2023-01-11 LAB — CBC
HCT: 31.3 % — ABNORMAL LOW (ref 36.0–46.0)
Hemoglobin: 10.3 g/dL — ABNORMAL LOW (ref 12.0–15.0)
MCH: 32.3 pg (ref 26.0–34.0)
MCHC: 32.9 g/dL (ref 30.0–36.0)
MCV: 98.1 fL (ref 80.0–100.0)
Platelets: 530 10*3/uL — ABNORMAL HIGH (ref 150–400)
RBC: 3.19 MIL/uL — ABNORMAL LOW (ref 3.87–5.11)
RDW: 12.6 % (ref 11.5–15.5)
WBC: 8.8 10*3/uL (ref 4.0–10.5)
nRBC: 0 % (ref 0.0–0.2)

## 2023-01-11 LAB — COMPREHENSIVE METABOLIC PANEL
ALT: 7 U/L (ref 0–44)
AST: 18 U/L (ref 15–41)
Albumin: 4.4 g/dL (ref 3.5–5.0)
Alkaline Phosphatase: 82 U/L (ref 38–126)
Anion gap: 10 (ref 5–15)
BUN: 16 mg/dL (ref 6–20)
CO2: 24 mmol/L (ref 22–32)
Calcium: 9 mg/dL (ref 8.9–10.3)
Chloride: 98 mmol/L (ref 98–111)
Creatinine, Ser: 1.25 mg/dL — ABNORMAL HIGH (ref 0.44–1.00)
GFR, Estimated: 52 mL/min — ABNORMAL LOW (ref >60)
Glucose, Bld: 85 mg/dL (ref 70–99)
Potassium: 3.6 mmol/L (ref 3.5–5.1)
Sodium: 132 mmol/L — ABNORMAL LOW (ref 135–145)
Total Bilirubin: 1.1 mg/dL (ref 0.3–1.2)
Total Protein: 8.3 g/dL — ABNORMAL HIGH (ref 6.5–8.1)

## 2023-01-11 LAB — LIPASE, BLOOD: Lipase: 23 U/L (ref 11–51)

## 2023-01-11 LAB — I-STAT BETA HCG BLOOD, ED (MC, WL, AP ONLY): I-stat hCG, quantitative: 7.7 m[IU]/mL — ABNORMAL HIGH (ref <5)

## 2023-01-11 NOTE — ED Provider Triage Note (Signed)
Emergency Medicine Provider Triage Evaluation Note  Kimberly Bruce , a 52 y.o. female  was evaluated in triage.  Pt complains of epigastric abdominal pain has been going on for 6 weeks.  Denies fever, nausea, vomiting, urinary symptoms, bowel changes.  Review of Systems  Positive: As above Negative: As above  Physical Exam  BP (!) 148/86 (BP Location: Left Arm)   Pulse (!) 112   Temp 98.7 F (37.1 C) (Oral)   Resp 18   LMP 04/12/2017   SpO2 100%  Gen:   Awake, no distress   Resp:  Normal effort  MSK:   Moves extremities without difficulty  Other:    Medical Decision Making  Medically screening exam initiated at 11:12 PM.  Appropriate orders placed.  Zelie Pilgrim was informed that the remainder of the evaluation will be completed by another provider, this initial triage assessment does not replace that evaluation, and the importance of remaining in the ED until their evaluation is complete.    Rex Kras, [place] 01/11/23 2313

## 2023-01-11 NOTE — ED Triage Notes (Signed)
Pt states that she has been having epigastric pain that she been going on for 6 weeks, sometimes radiates to her chest. Denies n/v/d

## 2023-01-12 ENCOUNTER — Emergency Department (HOSPITAL_COMMUNITY): Payer: 59

## 2023-01-12 LAB — URINALYSIS, ROUTINE W REFLEX MICROSCOPIC
Bilirubin Urine: NEGATIVE
Glucose, UA: NEGATIVE mg/dL
Hgb urine dipstick: NEGATIVE
Ketones, ur: NEGATIVE mg/dL
Nitrite: NEGATIVE
Protein, ur: NEGATIVE mg/dL
Specific Gravity, Urine: 1.012 (ref 1.005–1.030)
pH: 5 (ref 5.0–8.0)

## 2023-01-12 LAB — AMYLASE: Amylase: 82 U/L (ref 28–100)

## 2023-01-12 LAB — HCG, SERUM, QUALITATIVE: Preg, Serum: NEGATIVE

## 2023-01-12 MED ORDER — SUCRALFATE 1 G PO TABS
1.0000 g | ORAL_TABLET | Freq: Once | ORAL | Status: AC
  Administered 2023-01-12: 1 g via ORAL
  Filled 2023-01-12: qty 1

## 2023-01-12 MED ORDER — AMOXICILLIN 500 MG PO CAPS: 1000.0000 mg | ORAL_CAPSULE | Freq: Two times a day (BID) | ORAL | 0 refills | Status: DC

## 2023-01-12 MED ORDER — OMEPRAZOLE 20 MG PO CPDR: 20.0000 mg | DELAYED_RELEASE_CAPSULE | Freq: Every day | ORAL | 0 refills | Status: DC

## 2023-01-12 MED ORDER — PANTOPRAZOLE SODIUM 20 MG PO TBEC: 20.0000 mg | DELAYED_RELEASE_TABLET | Freq: Every day | ORAL | Status: DC

## 2023-01-12 MED ORDER — PANTOPRAZOLE SODIUM 20 MG PO TBEC
20.0000 mg | DELAYED_RELEASE_TABLET | Freq: Every day | ORAL | Status: DC
  Administered 2023-01-12: 20 mg via ORAL
  Filled 2023-01-12: qty 1

## 2023-01-12 MED ORDER — HYDROCODONE-ACETAMINOPHEN 5-325 MG PO TABS
1.0000 | ORAL_TABLET | Freq: Once | ORAL | Status: AC
  Administered 2023-01-12: 1 via ORAL
  Filled 2023-01-12: qty 1

## 2023-01-12 MED ORDER — CLARITHROMYCIN 500 MG PO TABS: 500.0000 mg | ORAL_TABLET | Freq: Two times a day (BID) | ORAL | 0 refills | Status: DC

## 2023-01-12 MED ORDER — SUCRALFATE 1 G PO TABS: 1.0000 g | ORAL_TABLET | Freq: Three times a day (TID) | ORAL | 0 refills | Status: DC

## 2023-01-12 NOTE — ED Provider Notes (Signed)
[facility] Provider Note   CSN: [id number] Arrival date & time: 01/11/23  2238     History  Chief Complaint  Patient presents with   Abdominal Pain    Kimberly Bruce is a 52 y.o. female with medical history of anxiety, hiatal hernia, insomnia, ovarian cyst.  Patient presents to the ED for evaluation of abdominal pain.  Patient reports that for the last 6 weeks she has had intermittent, waxing and waning upper abdominal pain.  Patient reports that there are no alleviating or aggravating factors, she is not sure of what makes the pain worse or better.  The patient states that she has been taking Advil at home which will relieve the pain however in the last 1 week Advil has no longer made her pain go away.  The patient denies any fevers, nausea, vomiting, diarrhea, dysuria, chest pain, shortness of breath, body aches or chills.  Patient denies any blood in stool.  Patient reports she last saw her PCP 4 weeks ago, did not mention her abdominal pain at that time.  Patient reports that she thought the pain would just go away.  Patient is on to state that currently she has no abdominal pain.  Patient goes on to add to the story that in the past she has had instances of H. pylori infection, she was seen by GI for this.   Abdominal Pain      Home Medications Prior to Admission medications   Medication Sig Start Date End Date Taking? Authorizing Provider  amoxicillin (AMOXIL) 500 MG capsule Take 2 capsules (1,000 mg total) by mouth 2 (two) times daily. 01/12/23  Yes Azucena Cecil, PA-C  clarithromycin (BIAXIN) 500 MG tablet Take 1 tablet (500 mg total) by mouth 2 (two) times daily. 01/12/23  Yes Azucena Cecil, PA-C  omeprazole (PRILOSEC) 20 MG capsule Take 1 capsule (20 mg total) by mouth daily. 01/12/23  Yes Azucena Cecil, PA-C  sucralfate (CARAFATE) 1 g tablet Take 1 tablet (1 g total) by mouth 4 (four) times daily -  with meals and  at bedtime. 01/12/23  Yes Azucena Cecil, PA-C  zolpidem (AMBIEN) 10 MG tablet Take 10 mg by mouth at bedtime. 11/24/19   [provider]      Allergies    Citalopram    Review of Systems   Review of Systems  Gastrointestinal:  Positive for abdominal pain.  All other systems reviewed and are negative.   Physical Exam Updated Vital Signs BP 132/83   Pulse 94   Temp 98.7 F (37.1 C) (Oral)   Resp 18   LMP 04/12/2017   SpO2 99%  Physical Exam Vitals and nursing note reviewed.  Constitutional:      General: She is not in acute distress.    Appearance: Normal appearance. She is not ill-appearing, toxic-appearing or diaphoretic.  HENT:     Head: Normocephalic and atraumatic.     Nose: Nose normal. No congestion.     Mouth/Throat:     Mouth: Mucous membranes are moist.     Pharynx: Oropharynx is clear.  Eyes:     Extraocular Movements: Extraocular movements intact.     Conjunctiva/sclera: Conjunctivae normal.     Pupils: Pupils are equal, round, and reactive to light.  Cardiovascular:     Rate and Rhythm: Normal rate and regular rhythm.  Pulmonary:     Effort: Pulmonary effort is normal.     Breath sounds: Normal breath  sounds. No wheezing.  Abdominal:     General: Abdomen is flat. Bowel sounds are normal.     Palpations: Abdomen is soft.     Tenderness: There is abdominal tenderness.     Comments: TTP in RUQ, LUQ. Negative murphy sign.   Musculoskeletal:     Cervical back: Normal range of motion and neck supple.  Skin:    General: Skin is warm and dry.     Capillary Refill: Capillary refill takes less than 2 seconds.  Neurological:     Mental Status: She is alert and oriented to person, place, and time.     ED Results / Procedures / Treatments   Labs (all labs ordered are listed, but only abnormal results are displayed) Labs Reviewed  COMPREHENSIVE METABOLIC PANEL - Abnormal; Notable for the following components:      Result Value   Sodium 132 (*)     Creatinine, Ser 1.25 (*)    Total Protein 8.3 (*)    GFR, Estimated 52 (*)    All other components within normal limits  CBC - Abnormal; Notable for the following components:   RBC 3.19 (*)    Hemoglobin 10.3 (*)    HCT 31.3 (*)    Platelets 530 (*)    All other components within normal limits  URINALYSIS, ROUTINE W REFLEX MICROSCOPIC - Abnormal; Notable for the following components:   APPearance HAZY (*)    Leukocytes,Ua MODERATE (*)    Bacteria, UA RARE (*)    All other components within normal limits  I-STAT BETA HCG BLOOD, ED ([facility], [facility], [facility] ONLY) - Abnormal; Notable for the following components:   I-stat hCG, quantitative 7.7 (*)    All other components within normal limits  LIPASE, BLOOD  HCG, SERUM, QUALITATIVE  AMYLASE    EKG EKG Interpretation  Date/Time:  Thursday January 11 2023 23:20:23 EDT Ventricular Rate:  107 PR Interval:  165 QRS Duration: 74 QT Interval:  311 QTC Calculation: 415 R Axis:   55 Text Interpretation: Sinus tachycardia Borderline T abnormalities, diffuse leads Confirmed by Quintella Reichert [phone]) on 01/12/2023 4:50:54 AM  Radiology US Abdomen Limited RUQ (LIVER/GB)  Result Date: 01/12/2023 CLINICAL DATA:  52 year old female with history of right upper quadrant abdominal pain. EXAM: ULTRASOUND ABDOMEN LIMITED RIGHT UPPER QUADRANT COMPARISON:  No prior right upper quadrant ultrasound. CT of the abdomen and pelvis 01/12/2023. FINDINGS: Gallbladder: No gallstones or wall thickening visualized. No sonographic Murphy sign noted by sonographer. Common bile duct: Diameter: 3 mm Liver: No focal lesion identified. Within normal limits in parenchymal echogenicity. Portal vein is patent on color Doppler imaging with normal direction of blood flow towards the liver. Other: None. IMPRESSION: 1. No acute findings. Specifically, no gallstones or findings to suggest an acute cholecystitis. Electronically Signed   By: Vinnie Langton M.D.   On: 01/12/2023 05:35   CT  ABDOMEN PELVIS WO CONTRAST  Result Date: 01/12/2023 CLINICAL DATA:  Abdominal pain, acute, nonlocalized EXAM: CT ABDOMEN AND PELVIS WITHOUT CONTRAST TECHNIQUE: Multidetector CT imaging of the abdomen and pelvis was performed following the standard protocol without IV contrast. RADIATION DOSE REDUCTION: This exam was performed according to the departmental dose-optimization program which includes automated exposure control, adjustment of the mA and/or kV according to patient size and/or use of iterative reconstruction technique. COMPARISON:  None Available. FINDINGS: Lower chest: No acute abnormality. Hepatobiliary: No focal liver abnormality is seen. No gallstones, gallbladder wall thickening, or biliary dilatation. Pancreas: There is subtle inflammatory stranding surrounding the  first and second portion of the duodenum, pancreatic head and proximal body which may reflect changes related to acute pancreatitis or duodenitis. The pancreatic duct is not dilated. No peripancreatic fluid collections are identified. Spleen: Unremarkable Adrenals/Urinary Tract: The adrenal glands are unremarkable. The kidneys are normal in size and position. Simple cortical cyst noted within the upper pole and interpolar region of the right kidney for which no follow-up imaging is recommended. The kidneys are otherwise unremarkable. The bladder is unremarkable. Stomach/Bowel: As noted above, subtle inflammatory changes are seen surrounding the proximal duodenum and head of the pancreas which may relate to changes of duodenitis or acute pancreatitis. The stomach, small bowel, and large bowel are otherwise unremarkable. Appendix normal. No free intraperitoneal gas or fluid. Vascular/Lymphatic: No significant vascular findings are present. No enlarged abdominal or pelvic lymph nodes. Reproductive: Uterus and bilateral adnexa are unremarkable. Other: No abdominal wall hernia or abnormality. No abdominopelvic ascites. Musculoskeletal: No  acute or significant osseous findings. IMPRESSION: 1. Subtle inflammatory changes surrounding the proximal duodenum, pancreatic head and proximal body which may reflect changes of acute pancreatitis or duodenitis. Correlation with serum amylase and lipase may be helpful for further evaluation. Electronically Signed   By: Fidela Salisbury M.D.   On: 01/12/2023 03:52    Procedures Procedures   Medications Ordered in ED Medications  pantoprazole (PROTONIX) EC tablet 20 mg (20 mg Oral Given 01/12/23 0200)  HYDROcodone-acetaminophen (NORCO/VICODIN) 5-325 MG per tablet 1 tablet (1 tablet Oral Given 01/12/23 0257)  sucralfate (CARAFATE) tablet 1 g (1 g Oral Given 01/12/23 [id number])    ED Course/ Medical Decision Making/ A&P  Medical Decision Making Amount and/or Complexity of Data Reviewed Labs: ordered. Radiology: ordered.  Risk Prescription drug management.   52 year old female presents to ED for evaluation.  Please see HPI for further details.  On examination the patient is afebrile and nontachycardic.  The patient lung sounds are clear bilaterally, she is not hypoxic.  Abdomen is soft and compressible however the patient does have tenderness to palpation of her right upper quadrant, left upper quadrant.  The patient has a negative Murphy sign.  No rebound, no guarding.  No peripheral edema.  Patient neurological examination at baseline.  CBC without leukocytosis, hemoglobin at baseline.  CMP with decreased sodium to 132, creatinine of 1.25.  Unsure patient baseline creatinine, last creatinine we have for this patient is 1 year ago.  Patient has had no nausea or vomiting here, the patient will be advised to hydrate herself as an outpatient.  Patient lipase not elevated to 3 times upper limit of normal so doubt pancreatitis.  Amylase also not elevated.  Urinalysis unremarkable.  Patient denies any dysuria.  Patient given 20 mg Protonix, 1 g Carafate, 5 mg hydrocodone for pain.  On reassessment,  patient reports that her abdominal pain is decreased at this time.  CT abdomen pelvis shows evidence of duodenitis.  Radiology read also states that this can be seen in the setting of pancreatitis however the patient lipase is not elevated, the patient amylase is not elevated.  Patient denies any excessive alcohol use.  This can be seen in the setting of H. pylori infections, can also be seen in the setting of prolonged NSAID use.  Patient reports that she has taken NSAIDs for the last 6 weeks every day for abdominal pain.  Patient also adds that she has a history of H. pylori infection.  Due to this, will cover for both.  Will send patient home with Carafate,  PPI as well as triple therapy to include clarithromycin, amoxicillin.  The patient will be advised to follow-up with her GI doctor, she reports that she has been seen by [facility] in the past.  Patient was given return precautions and she has voiced understanding.  Patient has had all of her questions answered to her satisfaction.  The patient is stable for discharge.   Final Clinical Impression(s) / ED Diagnoses Final diagnoses:  Duodenitis    Rx / DC Orders ED Discharge Orders          Ordered    clarithromycin (BIAXIN) 500 MG tablet  2 times daily        01/12/23 0603    amoxicillin (AMOXIL) 500 MG capsule  2 times daily        01/12/23 0603    omeprazole (PRILOSEC) 20 MG capsule  Daily        01/12/23 0603    sucralfate (CARAFATE) 1 g tablet  3 times daily with meals & bedtime        01/12/23 0603              Azucena Cecil, PA-C 01/12/23 0604    Quintella Reichert, MD 01/12/23 2310

## 2023-01-12 NOTE — Discharge Instructions (Signed)
Return to the ED with any new or worsening signs or symptoms Please follow-up with  GI.  Please call and make an appointment to be seen. Please begin taking antibiotics as prescribed.  You will take Chloromycetin 500 mg twice daily for the next 10 days.  You will also take amoxicillin 1000 mg twice daily for the next 10 days. Please take omeprazole, PPI, that I prescribed you.  You will take this once daily.  You may supplement the omeprazole with Carafate taken 4 times daily by mouth. Please read the attached guide concerning duodenitis

## 2023-01-26 ENCOUNTER — Encounter: Payer: Self-pay | Admitting: Physician Assistant

## 2023-03-22 NOTE — Progress Notes (Signed)
03/23/2023 Kimberly Bruce [id number] 1971/01/08  Referring provider: [facility], [facility] Primary GI doctor: Dr. Lavon Paganini  ASSESSMENT AND PLAN:   Duodenitis with history of peptic ulcer disease 2017 secondary to NSAIDs, history of positive H. Pylori EGD 2021 H. pylori negative gastritis Severe epigastric pain since March, no NSAIDs, no smoking, no alcohol CT abdomen pelvis with duodenitis/possible pancreatitis but negative lipase and amylase. Most likely represents duodenitis had improvement with Carafate, never picked up Prilosec secondary to cost Will recheck labs including sed rate and lipase, CBC for anemia Will schedule for E[facility] to evaluate duodenitis, H. pylori, gastritis Or start on trial of PPI omeprazole 40 mg once daily  Will schedule EGD. I discussed risks of EGD with patient today, including risk of sedation, bleeding or perforation.  Patient provides understanding and gave verbal consent to proceed.   Iron deficiency anemia due to chronic blood loss EGD colonoscopy unremarkable 2021 Recall colonoscopy 2031   Patient Care Team: Parke Simmers, [facility] as PCP - General  HISTORY OF PRESENT ILLNESS: 52 y.o. female with a past medical history of anxiety, hiatal hernia, history of H. pylori infection 2017, PUD secondary to NSAIDs in 2017, insomnia, ovarian cyst and others listed below presents for evaluation of abdominal pain.   11/04/2019 EGD and colonoscopy with Dr. Lavon Paganini at [facility] for iron deficiency anemia, dyspepsia EGD normal esophagus, small hiatal hernia, patchy inflammation erythema entire stomach, normal duodenum.  Path negative for H. pylori Colonoscopy showed excellent bowel prep nonbleeding internal hemorrhoids otherwise unremarkable recall 10 years.  01/11/2023 ER visit for abdominal pain intermittent for 6 weeks. Urine showed moderate leukocytosis Creatinine 1.25, BUN 16, normal liver function  normal lipase and  amylase. Hemoglobin 10.3, MCV 98.1, platelets 530, no leukocytosis. hCG quantitative 7.7, qualitative negative Right upper quadrant ultrasound no acute findings, no gallstones no acute cholecystitis, normal liver. CT abdomen pelvis without contrast subtle inflammatory changes from the proximal duodenum, pancreatic head and proximal body which may reflect changes of acute pancreatitis or duodenitis.  No ductal dilation, unremarkable spleen.  She has been having upper AB pain since beginning of march.  Severe upper epigastric pain, no radiation. No associated nausea, vomiting, GERD, dysphagia. Worse with eating or drinking anything.  Denies NSAIDS, ETOH.  She took the carafate until completion but the prilosec was too expensive. Had 2 week supply of medication.  She was also given amoxicillin and biaxin but never took it.  Denies melena, hematochezia.  Has BM daily.   She  reports that she has never smoked. She has never used smokeless tobacco. She reports that she does not drink alcohol and does not use drugs.  RELEVANT LABS AND IMAGING: CBC    Component Value Date/Time   WBC 8.8 01/11/2023 2253   RBC 3.19 (L) 01/11/2023 2253   HGB 10.3 (L) 01/11/2023 2253   HCT 31.3 (L) 01/11/2023 2253   PLT 530 (H) 01/11/2023 2253   MCV 98.1 01/11/2023 2253   MCH 32.3 01/11/2023 2253   MCHC 32.9 01/11/2023 2253   RDW 12.6 01/11/2023 2253   LYMPHSABS 1.7 05/16/2022 1937   MONOABS 0.4 05/16/2022 1937   EOSABS 0.2 05/16/2022 1937   BASOSABS 0.0 05/16/2022 1937   Recent Labs    05/16/22 1937 01/11/23 2253  HGB 11.2* 10.3*    CMP     Component Value Date/Time   NA 132 (L) 01/11/2023 2253   K 3.6 01/11/2023 2253   CL 98 01/11/2023 2253  CO2 24 01/11/2023 2253   GLUCOSE 85 01/11/2023 2253   BUN 16 01/11/2023 2253   CREATININE 1.25 (H) 01/11/2023 2253   CREATININE 0.82 06/22/2021 0000   CALCIUM 9.0 01/11/2023 2253   CALCIUM 8.1 (L) 03/22/2019 0528   PROT 8.3 (H) 01/11/2023 2253    ALBUMIN 4.4 01/11/2023 2253   AST 18 01/11/2023 2253   ALT 7 01/11/2023 2253   ALKPHOS 82 01/11/2023 2253   BILITOT 1.1 01/11/2023 2253   GFRNONAA 52 (L) 01/11/2023 2253   GFRAA >60 12/01/2019 1240      Latest Ref Rng & Units 01/11/2023   10:53 PM 05/16/2022    7:37 PM 06/22/2021   12:00 AM  Hepatic Function  Total Protein 6.5 - 8.1 g/dL 8.3  8.1  8.0   Albumin 3.5 - 5.0 g/dL 4.4  4.5    AST 15 - 41 U/L 18  21  16    ALT 0 - 44 U/L 7  6  6    Alk Phosphatase 38 - 126 U/L 82  91    Total Bilirubin 0.3 - 1.2 mg/dL 1.1  0.6  0.4       Current Medications:        Current Outpatient Medications (Other):    omeprazole (PRILOSEC) 40 MG capsule, Take 1 capsule (40 mg total) by mouth daily.   sucralfate (CARAFATE) 1 g tablet, Take 1 tablet (1 g total) by mouth 4 (four) times daily -  with meals and at bedtime.   amoxicillin (AMOXIL) 500 MG capsule, Take 2 capsules (1,000 mg total) by mouth 2 (two) times daily. (Patient not taking: Reported on 03/23/2023)   clarithromycin (BIAXIN) 500 MG tablet, Take 1 tablet (500 mg total) by mouth 2 (two) times daily. (Patient not taking: Reported on 03/23/2023)   zolpidem (AMBIEN) 10 MG tablet, Take 10 mg by mouth at bedtime. (Patient not taking: Reported on 03/23/2023)  Medical History:  Past Medical History:  Diagnosis Date   Anemia    since teenage years   Anxiety 1996   Blood transfusion without reported diagnosis    Hiatal hernia    Insomnia    Ovarian cyst    Upper GI bleed    Allergies:  Allergies  Allergen Reactions   Citalopram Nausea And Vomiting     Surgical History:  She  has a past surgical history that includes Cesarean section; Wisdom tooth extraction; laparoscopy (N/A, 01/30/2014); Robotic assisted laparoscopic lysis of adhesion (N/A, 01/30/2014); Esophagogastroduodenoscopy (N/A, 02/21/2016); Esophagogastroduodenoscopy (egd) with propofol (N/A, 03/22/2019); Hemostasis control (03/22/2019); Hot hemostasis (N/A, 03/22/2019);  Esophagogastroduodenoscopy (egd) with propofol (N/A, 11/04/2019); Colonoscopy with propofol (N/A, 11/04/2019); and biopsy (11/04/2019). Family History:  Her family history includes Alcohol abuse in her maternal grandmother; Aneurysm in her father; Breast cancer in her maternal aunt; Cancer in her maternal uncle; Heart disease in her paternal grandmother; Hepatitis C in her father; Lung cancer (age of onset: 49) in her sister; Other in her paternal grandfather.  REVIEW OF SYSTEMS  : All other systems reviewed and negative except where noted in the History of Present Illness.  PHYSICAL EXAM: BP 106/64   Pulse (!) 116   Ht 4\' 11"  (1.499 m)   Wt 163 lb (73.9 kg)   LMP 04/12/2017   BMI 32.92 kg/m  General Appearance: Well nourished, in no apparent distress. Head:   Normocephalic and atraumatic. Eyes:  sclerae anicteric,conjunctive pink  Respiratory: Respiratory effort normal, BS equal bilaterally without rales, rhonchi, wheezing. Cardio: RRR with no MRGs. Peripheral pulses  intact.  Abdomen: Soft,  Non-distended ,active bowel sounds. mild tenderness in the epigastrium. Without guarding and Without rebound. No masses. Rectal: Not evaluated Musculoskeletal: Full ROM, Normal gait. Without edema. Skin:  Dry and intact without significant lesions or rashes Neuro: Alert and  oriented x4;  No focal deficits. Psych:  Cooperative. Normal mood and affect.    Doree Albee, PA-C 3:32 PM

## 2023-03-23 ENCOUNTER — Encounter: Payer: Self-pay | Admitting: Physician Assistant

## 2023-03-23 ENCOUNTER — Other Ambulatory Visit (INDEPENDENT_AMBULATORY_CARE_PROVIDER_SITE_OTHER): Payer: 59

## 2023-03-23 ENCOUNTER — Ambulatory Visit (INDEPENDENT_AMBULATORY_CARE_PROVIDER_SITE_OTHER): Payer: 59 | Admitting: Physician Assistant

## 2023-03-23 VITALS — BP 106/64 | HR 116 | Ht 59.0 in | Wt 163.0 lb

## 2023-03-23 DIAGNOSIS — Z8711 Personal history of peptic ulcer disease: Secondary | ICD-10-CM | POA: Diagnosis not present

## 2023-03-23 DIAGNOSIS — D500 Iron deficiency anemia secondary to blood loss (chronic): Secondary | ICD-10-CM

## 2023-03-23 DIAGNOSIS — K2980 Duodenitis without bleeding: Secondary | ICD-10-CM

## 2023-03-23 DIAGNOSIS — R1013 Epigastric pain: Secondary | ICD-10-CM

## 2023-03-23 LAB — COMPREHENSIVE METABOLIC PANEL
ALT: 4 U/L (ref 0–35)
AST: 17 U/L (ref 0–37)
Albumin: 4.1 g/dL (ref 3.5–5.2)
Alkaline Phosphatase: 91 U/L (ref 39–117)
BUN: 14 mg/dL (ref 6–23)
CO2: 29 mEq/L (ref 19–32)
Calcium: 9.3 mg/dL (ref 8.4–10.5)
Chloride: 106 mEq/L (ref 96–112)
Creatinine, Ser: 0.88 mg/dL (ref 0.40–1.20)
GFR: 75.5 mL/min (ref >60.00)
Glucose, Bld: 65 mg/dL — ABNORMAL LOW (ref 70–99)
Potassium: 4.2 mEq/L (ref 3.5–5.1)
Sodium: 140 mEq/L (ref 135–145)
Total Bilirubin: 0.2 mg/dL (ref 0.2–1.2)
Total Protein: 7.9 g/dL (ref 6.0–8.3)

## 2023-03-23 LAB — CBC WITH DIFFERENTIAL/PLATELET
Basophils Absolute: 0 10*3/uL (ref 0.0–0.1)
Basophils Relative: 0.8 % (ref 0.0–3.0)
Eosinophils Absolute: 0.2 10*3/uL (ref 0.0–0.7)
Eosinophils Relative: 3.9 % (ref 0.0–5.0)
HCT: 28.4 % — ABNORMAL LOW (ref 36.0–46.0)
Hemoglobin: 9.1 g/dL — ABNORMAL LOW (ref 12.0–15.0)
Lymphocytes Relative: 31.6 % (ref 12.0–46.0)
Lymphs Abs: 1.7 10*3/uL (ref 0.7–4.0)
MCHC: 32.2 g/dL (ref 30.0–36.0)
MCV: 94.2 fl (ref 78.0–100.0)
Monocytes Absolute: 0.5 10*3/uL (ref 0.1–1.0)
Monocytes Relative: 10.1 % (ref 3.0–12.0)
Neutro Abs: 2.9 10*3/uL (ref 1.4–7.7)
Neutrophils Relative %: 53.6 % (ref 43.0–77.0)
Platelets: 581 10*3/uL — ABNORMAL HIGH (ref 150.0–400.0)
RBC: 3.01 Mil/uL — ABNORMAL LOW (ref 3.87–5.11)
RDW: 13.9 % (ref 11.5–15.5)
WBC: 5.4 10*3/uL (ref 4.0–10.5)

## 2023-03-23 LAB — SEDIMENTATION RATE: Sed Rate: 82 mm/hr — ABNORMAL HIGH (ref 0–30)

## 2023-03-23 LAB — LIPASE: Lipase: 12 U/L (ref 11.0–59.0)

## 2023-03-23 MED ORDER — OMEPRAZOLE 40 MG PO CPDR: 40.0000 mg | DELAYED_RELEASE_CAPSULE | Freq: Every day | ORAL | 3 refills | Status: DC

## 2023-03-23 NOTE — Patient Instructions (Addendum)
Your provider has requested that you go to the basement level for lab work before leaving today. Press "B" on the elevator. The lab is located at the first door on the left as you exit the elevator.  You have been scheduled for an endoscopy. Please follow written instructions given to you at your visit today. If you use inhalers (even only as needed), please bring them with you on the day of your procedure.  _______________________________________________________  If your blood pressure at your visit was 140/90 or greater, please contact your primary care physician to follow up on this.  _______________________________________________________  If you are age 47 or older, your body mass index should be between 23-30. Your Body mass index is 32.92 kg/m. If this is out of the aforementioned range listed, please consider follow up with your Primary Care Provider.  If you are age 70 or younger, your body mass index should be between 19-25. Your Body mass index is 32.92 kg/m. If this is out of the aformentioned range listed, please consider follow up with your Primary Care Provider.   ________________________________________________________  The [facility] providers would like to encourage you to use [facility] to communicate with providers for non-urgent requests or questions.  Due to long hold times on the telephone, sending your provider a message by [facility] may be a faster and more efficient way to get a response.  Please allow 48 business hours for a response.  Please remember that this is for non-urgent requests.  _______________________________________________________ It was a pleasure to see you today!  Thank you for trusting me with your gastrointestinal care!

## 2023-04-05 ENCOUNTER — Ambulatory Visit: Payer: 59 | Admitting: Gastroenterology

## 2023-04-05 ENCOUNTER — Encounter: Payer: Self-pay | Admitting: Gastroenterology

## 2023-04-05 VITALS — BP 125/76 | HR 128 | Temp 98.0°F | Ht 59.0 in | Wt 163.0 lb

## 2023-04-05 MED ORDER — SODIUM CHLORIDE 0.9 % IV SOLN: 500.0000 mL | Freq: Once | INTRAVENOUS | Status: AC

## 2023-04-05 NOTE — Progress Notes (Signed)
Patient took phentermine yesterday.Per Dr Lavon Paganini, patient needs to be rescheduled and needs to be off of Phentermine for 10 days prior to procedure.

## 2023-06-07 ENCOUNTER — Emergency Department (HOSPITAL_COMMUNITY)
Admission: EM | Admit: 2023-06-07 | Discharge: 2023-06-08 | Disposition: A | Discharge status: Home / Self Care | Payer: 59 | Attending: Emergency Medicine | Admitting: Emergency Medicine

## 2023-06-07 ENCOUNTER — Other Ambulatory Visit: Payer: Self-pay

## 2023-06-07 ENCOUNTER — Encounter (HOSPITAL_COMMUNITY): Payer: Self-pay | Admitting: Emergency Medicine

## 2023-06-07 DIAGNOSIS — R1013 Epigastric pain: Secondary | ICD-10-CM | POA: Diagnosis not present

## 2023-06-07 LAB — URINALYSIS, ROUTINE W REFLEX MICROSCOPIC
Bilirubin Urine: NEGATIVE
Glucose, UA: NEGATIVE mg/dL
Hgb urine dipstick: NEGATIVE
Ketones, ur: NEGATIVE mg/dL
Leukocytes,Ua: NEGATIVE
Nitrite: NEGATIVE
Protein, ur: NEGATIVE mg/dL
Specific Gravity, Urine: 1.015 (ref 1.005–1.030)
pH: 5 (ref 5.0–8.0)

## 2023-06-07 LAB — CBC
HCT: 29.3 % — ABNORMAL LOW (ref 36.0–46.0)
Hemoglobin: 9.2 g/dL — ABNORMAL LOW (ref 12.0–15.0)
MCH: 29 pg (ref 26.0–34.0)
MCHC: 31.4 g/dL (ref 30.0–36.0)
MCV: 92.4 fL (ref 80.0–100.0)
Platelets: 483 10*3/uL — ABNORMAL HIGH (ref 150–400)
RBC: 3.17 MIL/uL — ABNORMAL LOW (ref 3.87–5.11)
RDW: 15.5 % (ref 11.5–15.5)
WBC: 8.5 10*3/uL (ref 4.0–10.5)
nRBC: 0 % (ref 0.0–0.2)

## 2023-06-07 LAB — COMPREHENSIVE METABOLIC PANEL
ALT: 7 U/L (ref 0–44)
AST: 17 U/L (ref 15–41)
Albumin: 4 g/dL (ref 3.5–5.0)
Alkaline Phosphatase: 78 U/L (ref 38–126)
Anion gap: 9 (ref 5–15)
BUN: 18 mg/dL (ref 6–20)
CO2: 25 mmol/L (ref 22–32)
Calcium: 9.1 mg/dL (ref 8.9–10.3)
Chloride: 100 mmol/L (ref 98–111)
Creatinine, Ser: 1.42 mg/dL — ABNORMAL HIGH (ref 0.44–1.00)
GFR, Estimated: 45 mL/min — ABNORMAL LOW (ref >60)
Glucose, Bld: 86 mg/dL (ref 70–99)
Potassium: 3.9 mmol/L (ref 3.5–5.1)
Sodium: 134 mmol/L — ABNORMAL LOW (ref 135–145)
Total Bilirubin: 0.5 mg/dL (ref 0.3–1.2)
Total Protein: 7.7 g/dL (ref 6.5–8.1)

## 2023-06-07 LAB — LIPASE, BLOOD: Lipase: 22 U/L (ref 11–51)

## 2023-06-07 MED ORDER — PANTOPRAZOLE SODIUM 40 MG PO TBEC: 40.0000 mg | DELAYED_RELEASE_TABLET | Freq: Every day | ORAL | Status: DC

## 2023-06-07 MED ORDER — LACTATED RINGERS IV BOLUS
1000.0000 mL | Freq: Once | INTRAVENOUS | Status: AC
  Administered 2023-06-07: 1000 mL via INTRAVENOUS

## 2023-06-07 MED ORDER — LIDOCAINE VISCOUS HCL 2 % MT SOLN
15.0000 mL | Freq: Once | OROMUCOSAL | Status: AC
  Administered 2023-06-07: 15 mL via ORAL
  Filled 2023-06-07: qty 15

## 2023-06-07 MED ORDER — FAMOTIDINE IN NACL 20-0.9 MG/50ML-% IV SOLN
20.0000 mg | Freq: Once | INTRAVENOUS | Status: AC
  Administered 2023-06-07: 20 mg via INTRAVENOUS
  Filled 2023-06-07: qty 50

## 2023-06-07 MED ORDER — ALUM & MAG HYDROXIDE-SIMETH 200-200-20 MG/5ML PO SUSP
30.0000 mL | Freq: Once | ORAL | Status: AC
  Administered 2023-06-07: 30 mL via ORAL
  Filled 2023-06-07: qty 30

## 2023-06-07 NOTE — ED Triage Notes (Signed)
Patient presents due to sever upper abdominal pain. She describes the pain as intermittent, burning and achy. She has been seen for this previously. The patient has an appointment for this next Thursday, but could not wait. She has had low PO intake since last Thursday.

## 2023-06-07 NOTE — ED Notes (Signed)
Beverage provided, ok per EDP

## 2023-06-07 NOTE — ED Provider Notes (Signed)
[place] EMERGENCY DEPARTMENT AT [facility] Provider Note   CSN: [id number] Arrival date & time: 06/07/23  1738     History  Chief Complaint  Patient presents with   Abdominal Pain    Kimberly Bruce is a 52 y.o. female with history of duodenitis, H. pylori gastritis in 2021, who presents with concern for epigastric pain.  She states she has been having this pain since March, and has been getting worse in the past couple of days.  She states she was seen by GI and given the for this and was recommended to start on omeprazole and get an EGD done.  She states she did not start this recommended medication because she says " I did not feel like I needed it".  In the past couple days, the pain has been getting worse.  It is consistent with the pain she has been having.  She has been eating and drinking very little in the past couple days, but states it is not painful when she eats.  Denies any nausea or vomiting, no fever or chills, no black or bloody stools.  She reports some constipation recently and has been taking Dulcolax for this. Last bowel movement was yesterday.   Abdominal Pain      Home Medications Prior to Admission medications   Medication Sig Start Date End Date Taking? Authorizing Provider  amoxicillin (AMOXIL) 500 MG capsule Take 2 capsules (1,000 mg total) by mouth 2 (two) times daily. Patient not taking: Reported on 03/23/2023 01/12/23   Al Decant, PA-C  clarithromycin (BIAXIN) 500 MG tablet Take 1 tablet (500 mg total) by mouth 2 (two) times daily. Patient not taking: Reported on 03/23/2023 01/12/23   Al Decant, PA-C  omeprazole (PRILOSEC) 40 MG capsule Take 1 capsule (40 mg total) by mouth daily. 03/23/23   Doree Albee, PA-C  sucralfate (CARAFATE) 1 g tablet Take 1 tablet (1 g total) by mouth 4 (four) times daily -  with meals and at bedtime. 01/12/23   Al Decant, PA-C  zolpidem (AMBIEN) 10 MG tablet Take 10 mg by mouth at  bedtime. Patient not taking: Reported on 03/23/2023 11/24/19   [provider]      Allergies    Citalopram    Review of Systems   Review of Systems  Gastrointestinal:  Positive for abdominal pain.    Physical Exam Updated Vital Signs BP (!) 116/59 (BP Location: Left Arm)   Pulse 81   Temp 98.7 F (37.1 C) (Oral)   Resp 16   Ht 4\' 11"  (1.499 m)   Wt 72.6 kg   LMP 04/12/2017   SpO2 100%   BMI 32.32 kg/m  Physical Exam Vitals and nursing note reviewed.  Constitutional:      General: She is not in acute distress.    Appearance: She is well-developed.     Comments: Able to sit up and move around in bed without pain  HENT:     Head: Normocephalic and atraumatic.  Eyes:     Conjunctiva/sclera: Conjunctivae normal.  Cardiovascular:     Rate and Rhythm: Normal rate and regular rhythm.     Heart sounds: No murmur heard. Pulmonary:     Effort: Pulmonary effort is normal. No respiratory distress.     Breath sounds: Normal breath sounds.  Abdominal:     General: Abdomen is flat. Bowel sounds are normal.     Palpations: Abdomen is soft.     Tenderness: There  is no abdominal tenderness.     Comments: No tenderness to palpation diffusely of the abdomen, no rebound or guarding  Musculoskeletal:        General: No swelling.     Cervical back: Neck supple.  Skin:    General: Skin is warm and dry.     Capillary Refill: Capillary refill takes less than 2 seconds.  Neurological:     Mental Status: She is alert.  Psychiatric:        Mood and Affect: Mood normal.     ED Results / Procedures / Treatments   Labs (all labs ordered are listed, but only abnormal results are displayed) Labs Reviewed  COMPREHENSIVE METABOLIC PANEL - Abnormal; Notable for the following components:      Result Value   Sodium 134 (*)    Creatinine, Ser 1.42 (*)    GFR, Estimated 45 (*)    All other components within normal limits  CBC - Abnormal; Notable for the following components:   RBC  3.17 (*)    Hemoglobin 9.2 (*)    HCT 29.3 (*)    Platelets 483 (*)    All other components within normal limits  LIPASE, BLOOD  URINALYSIS, ROUTINE W REFLEX MICROSCOPIC    EKG None  Radiology No results found.  Procedures Procedures    Medications Ordered in ED Medications  alum & mag hydroxide-simeth (MAALOX/MYLANTA) 200-200-20 MG/5ML suspension 30 mL (has no administration in time range)    And  lidocaine (XYLOCAINE) 2 % viscous mouth solution 15 mL (has no administration in time range)  lactated ringers bolus 1,000 mL (has no administration in time range)  famotidine (PEPCID) IVPB 20 mg premix (has no administration in time range)    ED Course/ Medical Decision Making/ A&P                                 Medical Decision Making Amount and/or Complexity of Data Reviewed Labs: ordered.  Risk OTC drugs. Prescription drug management.   52 y.o. female with pertinent past medical history of duodenitis, H. pylori gastritis in 2021, presents to the ED for concern of epigastric abdominal pain  Differential diagnosis includes but is not limited to duodenitis, gastric ulcer, gastroenteritis, pancreatitis, cholecystitis, cholangitis  ED Course:  Patient presents to the ER with epigastric pain consistent with her previous and ongoing epigastric pain.  She was seen in the ER in March 2024 where she was found to have duodenitis on her CT.  She followed up with GI who recommended she start on omeprazole and get an EGD.  However, patient states she has not started the omeprazole because she does not feel like she needed it.  She now has had some worsening of her abdominal pain over the past week.  She did not want to wait until her GI appointment next Thursday for evaluation. Patient is nontender on abdominal palpation.  Her vital signs are within normal limits upon arrival.  CMP with slight elevation in her creatinine at 1.42, up from 0.88 2 months ago.  Suspect this is due to  poor oral intake.  Will start on a LR bolus and reevaluate creatinine after this is finished.  She does not have any elevation of her LFTs, low suspicion for cholecystitis or cholangitis at this time.  No elevation in lipase, little concern for pancreatitis at this time.  CBC with hemoglobin at 9.2, however this seems to  be at patient's baseline.  No leukocytosis, no diarrhea, less concern for infectious etiology.  No recent NSAID use, no blood in her stools, no suspicion for gastric ulcer at this time. Do not feel she needs a repeat CT image at this time given her pain is consistent with her ongoing duodenitis and she has GI follow up in 1 week.  Feel her pain is worse due to medication noncompliance. Patient given IV Pepcid, Maalox for pain. Upon re-evaluation, patient reports pain has resolved with Maalox.  Her repeat BMP shows creatinine has improved at 1.08, will have patient continue hydrating at home with fluids.  Patient appropriate to discharge home at this time.  She was instructed to take omeprazole as prescribed by her GI provider.  She can take Maalox for abdominal pain as well.  Follow-up at her GI appointment on 8/22  Impression: Epigastric pain  Disposition:  The patient was discharged home with instructions to take Maalox and omeprazole.  Continue hydrating well at home.  Follow-up with GI appointment on 8/22 Return precautions given.  Lab Tests: I Ordered, and personally interpreted labs.  The pertinent results include:   CBC with no leukocytosis, hemoglobin at 9.2 consistent with patient baseline CMP with cr at 1.42, up from 0.88 2 months ago.  LFTs within normal limits Repeat Bmp after fluids with cr 1.08 Urinalysis unremarkable Lipase within normal limits    External records from outside source obtained and reviewed including GI appointment from 5/31 where they recommended omeprazole and EGD   Co morbidities that complicate the patient evaluation  duodenitis, H. pylori  gastritis in 2021,              Final Clinical Impression(s) / ED Diagnoses Final diagnoses:  None    Rx / DC Orders ED Discharge Orders     None         Arabella Merles, PA-C 06/08/23 8295    Alvira Monday, MD 06/08/23 2244

## 2023-06-07 NOTE — ED Notes (Signed)
EDP at bedside  

## 2023-06-08 LAB — BASIC METABOLIC PANEL
Anion gap: 6 (ref 5–15)
BUN: 16 mg/dL (ref 6–20)
CO2: 24 mmol/L (ref 22–32)
Calcium: 8.6 mg/dL — ABNORMAL LOW (ref 8.9–10.3)
Chloride: 105 mmol/L (ref 98–111)
Creatinine, Ser: 1.08 mg/dL — ABNORMAL HIGH (ref 0.44–1.00)
GFR, Estimated: >60 mL/min (ref >60)
Glucose, Bld: 85 mg/dL (ref 70–99)
Potassium: 4 mmol/L (ref 3.5–5.1)
Sodium: 135 mmol/L (ref 135–145)

## 2023-06-08 MED ORDER — ALUMINUM & MAGNESIUM HYDROXIDE 200-200 MG/5ML PO SUSP: 15.0000 mL | Freq: Four times a day (QID) | ORAL | 0 refills | Status: AC | PRN

## 2023-06-08 NOTE — Discharge Instructions (Signed)
You have been prescribed Maalox to take as needed for your abdominal pain.  Please also take the omeprazole as prescribed by your GI provider (40mg  daily).   Please attend your GI appointment on 06/14/23.  Return to the ER if you have uncontrolled vomiting or diarrhea, blood in your stool or vomit, black stools, you have severe abdominal pain, you become very dizzy, you experience chest pain or shortness of breath.

## 2023-06-11 ENCOUNTER — Inpatient Hospital Stay (HOSPITAL_COMMUNITY)
Admission: EM | Admit: 2023-06-11 | Discharge: 2023-06-14 | DRG: 378 | Discharge status: Against Medical Advice | Payer: 59 | Attending: Family Medicine | Admitting: Family Medicine

## 2023-06-11 ENCOUNTER — Other Ambulatory Visit: Payer: Self-pay

## 2023-06-11 ENCOUNTER — Encounter (HOSPITAL_COMMUNITY): Payer: Self-pay

## 2023-06-11 DIAGNOSIS — K2950 Unspecified chronic gastritis without bleeding: Secondary | ICD-10-CM | POA: Diagnosis present

## 2023-06-11 DIAGNOSIS — Z8619 Personal history of other infectious and parasitic diseases: Secondary | ICD-10-CM

## 2023-06-11 DIAGNOSIS — Z801 Family history of malignant neoplasm of trachea, bronchus and lung: Secondary | ICD-10-CM

## 2023-06-11 DIAGNOSIS — K254 Chronic or unspecified gastric ulcer with hemorrhage: Secondary | ICD-10-CM | POA: Diagnosis not present

## 2023-06-11 DIAGNOSIS — K2980 Duodenitis without bleeding: Secondary | ICD-10-CM | POA: Diagnosis present

## 2023-06-11 DIAGNOSIS — Z792 Long term (current) use of antibiotics: Secondary | ICD-10-CM

## 2023-06-11 DIAGNOSIS — E876 Hypokalemia: Secondary | ICD-10-CM | POA: Diagnosis not present

## 2023-06-11 DIAGNOSIS — K449 Diaphragmatic hernia without obstruction or gangrene: Secondary | ICD-10-CM | POA: Diagnosis present

## 2023-06-11 DIAGNOSIS — Z91148 Patient's other noncompliance with medication regimen for other reason: Secondary | ICD-10-CM

## 2023-06-11 DIAGNOSIS — Z5329 Procedure and treatment not carried out because of patient's decision for other reasons: Secondary | ICD-10-CM | POA: Diagnosis present

## 2023-06-11 DIAGNOSIS — K921 Melena: Secondary | ICD-10-CM

## 2023-06-11 DIAGNOSIS — Z8711 Personal history of peptic ulcer disease: Secondary | ICD-10-CM

## 2023-06-11 DIAGNOSIS — K922 Gastrointestinal hemorrhage, unspecified: Secondary | ICD-10-CM | POA: Diagnosis not present

## 2023-06-11 DIAGNOSIS — F419 Anxiety disorder, unspecified: Secondary | ICD-10-CM | POA: Diagnosis present

## 2023-06-11 DIAGNOSIS — R1013 Epigastric pain: Secondary | ICD-10-CM

## 2023-06-11 DIAGNOSIS — I10 Essential (primary) hypertension: Secondary | ICD-10-CM | POA: Diagnosis present

## 2023-06-11 DIAGNOSIS — Z79899 Other long term (current) drug therapy: Secondary | ICD-10-CM

## 2023-06-11 DIAGNOSIS — Z888 Allergy status to other drugs, medicaments and biological substances status: Secondary | ICD-10-CM

## 2023-06-11 DIAGNOSIS — Z803 Family history of malignant neoplasm of breast: Secondary | ICD-10-CM

## 2023-06-11 DIAGNOSIS — D62 Acute posthemorrhagic anemia: Secondary | ICD-10-CM | POA: Diagnosis present

## 2023-06-11 DIAGNOSIS — D649 Anemia, unspecified: Secondary | ICD-10-CM

## 2023-06-11 DIAGNOSIS — Z811 Family history of alcohol abuse and dependence: Secondary | ICD-10-CM

## 2023-06-11 DIAGNOSIS — K264 Chronic or unspecified duodenal ulcer with hemorrhage: Secondary | ICD-10-CM

## 2023-06-11 DIAGNOSIS — Z8249 Family history of ischemic heart disease and other diseases of the circulatory system: Secondary | ICD-10-CM

## 2023-06-11 DIAGNOSIS — K3189 Other diseases of stomach and duodenum: Secondary | ICD-10-CM | POA: Diagnosis present

## 2023-06-11 LAB — CBC
HCT: 27.5 % — ABNORMAL LOW (ref 36.0–46.0)
Hemoglobin: 8.6 g/dL — ABNORMAL LOW (ref 12.0–15.0)
MCH: 28.5 pg (ref 26.0–34.0)
MCHC: 31.3 g/dL (ref 30.0–36.0)
MCV: 91.1 fL (ref 80.0–100.0)
Platelets: 625 10*3/uL — ABNORMAL HIGH (ref 150–400)
RBC: 3.02 MIL/uL — ABNORMAL LOW (ref 3.87–5.11)
RDW: 15.9 % — ABNORMAL HIGH (ref 11.5–15.5)
WBC: 7.1 10*3/uL (ref 4.0–10.5)
nRBC: 0 % (ref 0.0–0.2)

## 2023-06-11 LAB — COMPREHENSIVE METABOLIC PANEL
ALT: 9 U/L (ref 0–44)
AST: 20 U/L (ref 15–41)
Albumin: 4 g/dL (ref 3.5–5.0)
Alkaline Phosphatase: 75 U/L (ref 38–126)
Anion gap: 10 (ref 5–15)
BUN: 13 mg/dL (ref 6–20)
CO2: 25 mmol/L (ref 22–32)
Calcium: 9.2 mg/dL (ref 8.9–10.3)
Chloride: 101 mmol/L (ref 98–111)
Creatinine, Ser: 0.89 mg/dL (ref 0.44–1.00)
GFR, Estimated: >60 mL/min (ref >60)
Glucose, Bld: 97 mg/dL (ref 70–99)
Potassium: 3.6 mmol/L (ref 3.5–5.1)
Sodium: 136 mmol/L (ref 135–145)
Total Bilirubin: 0.5 mg/dL (ref 0.3–1.2)
Total Protein: 8.4 g/dL — ABNORMAL HIGH (ref 6.5–8.1)

## 2023-06-11 LAB — TYPE AND SCREEN
ABO/RH(D): O POS
Antibody Screen: NEGATIVE

## 2023-06-11 NOTE — ED Triage Notes (Signed)
Patient began having dark bloody stools this morning. Complaining of generalized upper abdominal pain.

## 2023-06-12 ENCOUNTER — Observation Stay (HOSPITAL_BASED_OUTPATIENT_CLINIC_OR_DEPARTMENT_OTHER): Payer: 59 | Admitting: Anesthesiology

## 2023-06-12 ENCOUNTER — Observation Stay (HOSPITAL_COMMUNITY): Payer: 59 | Admitting: Anesthesiology

## 2023-06-12 ENCOUNTER — Encounter (HOSPITAL_COMMUNITY): Payer: Self-pay | Admitting: Internal Medicine

## 2023-06-12 ENCOUNTER — Encounter (HOSPITAL_COMMUNITY)
Admission: EM | Discharge status: Against Medical Advice | Payer: Self-pay | Source: Home / Self Care | Attending: Family Medicine

## 2023-06-12 DIAGNOSIS — K449 Diaphragmatic hernia without obstruction or gangrene: Secondary | ICD-10-CM

## 2023-06-12 DIAGNOSIS — I10 Essential (primary) hypertension: Secondary | ICD-10-CM

## 2023-06-12 DIAGNOSIS — K2950 Unspecified chronic gastritis without bleeding: Secondary | ICD-10-CM

## 2023-06-12 DIAGNOSIS — K3189 Other diseases of stomach and duodenum: Secondary | ICD-10-CM | POA: Diagnosis not present

## 2023-06-12 DIAGNOSIS — R1013 Epigastric pain: Secondary | ICD-10-CM

## 2023-06-12 DIAGNOSIS — D649 Anemia, unspecified: Secondary | ICD-10-CM | POA: Diagnosis not present

## 2023-06-12 DIAGNOSIS — K922 Gastrointestinal hemorrhage, unspecified: Secondary | ICD-10-CM | POA: Diagnosis present

## 2023-06-12 DIAGNOSIS — K259 Gastric ulcer, unspecified as acute or chronic, without hemorrhage or perforation: Secondary | ICD-10-CM

## 2023-06-12 DIAGNOSIS — K921 Melena: Secondary | ICD-10-CM

## 2023-06-12 HISTORY — PX: HEMOSTASIS CONTROL: SHX6838

## 2023-06-12 HISTORY — PX: ESOPHAGOGASTRODUODENOSCOPY: SHX5428

## 2023-06-12 HISTORY — PX: BIOPSY: SHX5522

## 2023-06-12 LAB — HIV ANTIBODY (ROUTINE TESTING W REFLEX): HIV Screen 4th Generation wRfx: NONREACTIVE

## 2023-06-12 LAB — POC OCCULT BLOOD, ED: Fecal Occult Bld: POSITIVE — AB

## 2023-06-12 LAB — HEMOGLOBIN AND HEMATOCRIT, BLOOD
HCT: 25.9 % — ABNORMAL LOW (ref 36.0–46.0)
Hemoglobin: 8.2 g/dL — ABNORMAL LOW (ref 12.0–15.0)

## 2023-06-12 SURGERY — EGD (ESOPHAGOGASTRODUODENOSCOPY)
Anesthesia: Monitor Anesthesia Care

## 2023-06-12 MED ORDER — LACTATED RINGERS IV SOLN: INTRAVENOUS | Status: DC

## 2023-06-12 MED ORDER — ACETAMINOPHEN 650 MG RE SUPP: 650.0000 mg | Freq: Four times a day (QID) | RECTAL | Status: DC | PRN

## 2023-06-12 MED ORDER — ALBUTEROL SULFATE (2.5 MG/3ML) 0.083% IN NEBU: 2.5000 mg | INHALATION_SOLUTION | RESPIRATORY_TRACT | Status: DC | PRN

## 2023-06-12 MED ORDER — LIDOCAINE VISCOUS HCL 2 % MT SOLN
15.0000 mL | Freq: Once | OROMUCOSAL | Status: AC
  Administered 2023-06-12: 15 mL via ORAL
  Filled 2023-06-12: qty 15

## 2023-06-12 MED ORDER — MORPHINE SULFATE (PF) 4 MG/ML IV SOLN
4.0000 mg | Freq: Once | INTRAVENOUS | Status: AC
  Administered 2023-06-12: 4 mg via INTRAVENOUS
  Filled 2023-06-12: qty 1

## 2023-06-12 MED ORDER — ONDANSETRON HCL 4 MG PO TABS: 4.0000 mg | ORAL_TABLET | Freq: Four times a day (QID) | ORAL | Status: DC | PRN

## 2023-06-12 MED ORDER — SUCRALFATE 1 G PO TABS
1.0000 g | ORAL_TABLET | Freq: Four times a day (QID) | ORAL | Status: DC
  Administered 2023-06-12 – 2023-06-14 (×8): 1 g via ORAL
  Filled 2023-06-12 (×9): qty 1

## 2023-06-12 MED ORDER — PROPOFOL 500 MG/50ML IV EMUL
INTRAVENOUS | Status: DC | PRN
  Administered 2023-06-12: 155 ug/kg/min via INTRAVENOUS

## 2023-06-12 MED ORDER — ONDANSETRON HCL 4 MG/2ML IJ SOLN: 4.0000 mg | Freq: Four times a day (QID) | INTRAMUSCULAR | Status: DC | PRN

## 2023-06-12 MED ORDER — ZOLPIDEM TARTRATE 5 MG PO TABS
5.0000 mg | ORAL_TABLET | Freq: Every evening | ORAL | Status: DC | PRN
  Administered 2023-06-12 – 2023-06-13 (×2): 5 mg via ORAL
  Filled 2023-06-12 (×2): qty 1

## 2023-06-12 MED ORDER — PROPRANOLOL HCL 20 MG PO TABS
20.0000 mg | ORAL_TABLET | Freq: Every evening | ORAL | Status: DC
  Administered 2023-06-12 – 2023-06-13 (×2): 20 mg via ORAL
  Filled 2023-06-12 (×2): qty 1

## 2023-06-12 MED ORDER — ALUM & MAG HYDROXIDE-SIMETH 200-200-20 MG/5ML PO SUSP: 30.0000 mL | ORAL | Status: DC | PRN

## 2023-06-12 MED ORDER — ALUM & MAG HYDROXIDE-SIMETH 200-200-20 MG/5ML PO SUSP
30.0000 mL | Freq: Once | ORAL | Status: AC
  Administered 2023-06-12: 30 mL via ORAL
  Filled 2023-06-12: qty 30

## 2023-06-12 MED ORDER — SODIUM CHLORIDE 0.9 % IV BOLUS
1000.0000 mL | Freq: Once | INTRAVENOUS | Status: AC
  Administered 2023-06-12: 1000 mL via INTRAVENOUS

## 2023-06-12 MED ORDER — PANTOPRAZOLE SODIUM 40 MG IV SOLR
40.0000 mg | Freq: Once | INTRAVENOUS | Status: AC
  Administered 2023-06-12: 40 mg via INTRAVENOUS
  Filled 2023-06-12: qty 10

## 2023-06-12 MED ORDER — MORPHINE SULFATE (PF) 2 MG/ML IV SOLN
1.0000 mg | INTRAVENOUS | Status: DC | PRN
  Administered 2023-06-12 – 2023-06-14 (×5): 1 mg via INTRAVENOUS
  Filled 2023-06-12 (×6): qty 1

## 2023-06-12 MED ORDER — ONDANSETRON HCL 4 MG/2ML IJ SOLN
INTRAMUSCULAR | Status: DC | PRN
  Administered 2023-06-12: 4 mg via INTRAVENOUS

## 2023-06-12 MED ORDER — PANTOPRAZOLE SODIUM 40 MG IV SOLR
40.0000 mg | Freq: Two times a day (BID) | INTRAVENOUS | Status: DC
  Administered 2023-06-12 – 2023-06-13 (×2): 40 mg via INTRAVENOUS
  Filled 2023-06-12 (×3): qty 10

## 2023-06-12 MED ORDER — ACETAMINOPHEN 325 MG PO TABS: 650.0000 mg | ORAL_TABLET | Freq: Four times a day (QID) | ORAL | Status: DC | PRN

## 2023-06-12 MED ORDER — SODIUM CHLORIDE 0.9 % IV SOLN: INTRAVENOUS | Status: DC

## 2023-06-12 MED ORDER — PANTOPRAZOLE SODIUM 40 MG IV SOLR
40.0000 mg | INTRAVENOUS | Status: DC
  Administered 2023-06-12: 40 mg via INTRAVENOUS
  Filled 2023-06-12: qty 10

## 2023-06-12 MED ORDER — LIDOCAINE HCL (CARDIAC) PF 100 MG/5ML IV SOSY
PREFILLED_SYRINGE | INTRAVENOUS | Status: DC | PRN
  Administered 2023-06-12: 80 mg via INTRAVENOUS

## 2023-06-12 MED ORDER — ONDANSETRON HCL 4 MG/2ML IJ SOLN
4.0000 mg | Freq: Once | INTRAMUSCULAR | Status: AC
  Administered 2023-06-12: 4 mg via INTRAVENOUS
  Filled 2023-06-12: qty 2

## 2023-06-12 MED ORDER — SODIUM CHLORIDE 0.9 % IV SOLN
INTRAVENOUS | Status: DC
  Administered 2023-06-14: 50 mL/h via INTRAVENOUS

## 2023-06-12 NOTE — ED Provider Notes (Signed)
[place] EMERGENCY DEPARTMENT AT [facility] Provider Note   CSN: [id number] Arrival date & time: 06/11/23  1829     History  Chief Complaint  Patient presents with   Rectal Bleeding    Kimberly Bruce is a 52 y.o. female.  52 year old female presents to the ER with worsening epigastric pain and concern for black stool x 1 episode today. Patient a history of PUD with perforation and surgical repair, was seen in the ER 06/07/23 for epigastric pain, provided with ER fluids and dc with plan for Maalox and Omeprazole and GI follow up on 8/22 as scheduled. Patient was told to return for black/stick stools which prompted her return to the ER tonight. Denies taking Pepto or iron supplement. No other complaints or concerns.        Home Medications Prior to Admission medications   Medication Sig Start Date End Date Taking? Authorizing Provider  aluminum-magnesium hydroxide 200-200 MG/5ML suspension Take 15 mLs by mouth every 6 (six) hours as needed for up to 7 days for indigestion (for abdominal pain). 06/08/23 06/15/23 Yes Arabella Merles, PA-C  amoxicillin (AMOXIL) 500 MG capsule Take 1,000 mg by mouth 2 (two) times daily.   Yes [provider]  clarithromycin (BIAXIN) 500 MG tablet Take 500 mg by mouth 2 (two) times daily.   Yes [provider]  hydrochlorothiazide (HYDRODIURIL) 12.5 MG tablet Take 12.5 mg by mouth daily as needed. 03/22/23  Yes [provider]  phentermine 30 MG capsule Take 30 mg by mouth daily. 05/21/23  Yes [provider]  propranolol (INDERAL) 20 MG tablet Take 20 mg by mouth every evening. 03/22/23  Yes [provider]  Vitamin D, Ergocalciferol, (DRISDOL) 1.25 MG (50000 UNIT) CAPS capsule Take 50,000 Units by mouth once a week. 03/14/23  Yes [provider]  zolpidem (AMBIEN) 10 MG tablet Take 10 mg by mouth at bedtime as needed for sleep. 11/24/19  Yes [provider]  amoxicillin (AMOXIL) 500 MG  capsule Take 2 capsules (1,000 mg total) by mouth 2 (two) times daily. Patient not taking: Reported on 03/23/2023 01/12/23   Al Decant, PA-C  clarithromycin (BIAXIN) 500 MG tablet Take 1 tablet (500 mg total) by mouth 2 (two) times daily. Patient not taking: Reported on 03/23/2023 01/12/23   Al Decant, PA-C  omeprazole (PRILOSEC) 40 MG capsule Take 1 capsule (40 mg total) by mouth daily. Patient not taking: Reported on 06/12/2023 03/23/23   Doree Albee, PA-C  sucralfate (CARAFATE) 1 g tablet Take 1 tablet (1 g total) by mouth 4 (four) times daily -  with meals and at bedtime. Patient not taking: Reported on 06/12/2023 01/12/23   Al Decant, PA-C      Allergies    Citalopram    Review of Systems   Review of Systems Negative except as per HPI Physical Exam Updated Vital Signs BP 124/67   Pulse 76   Temp 98.7 F (37.1 C)   Resp 18   Ht 4\' 11"  (1.499 m)   Wt 73 kg   LMP 04/12/2017   SpO2 99%   BMI 32.51 kg/m  Physical Exam Vitals and nursing note reviewed. Exam conducted with a chaperone present.  Constitutional:      General: She is not in acute distress.    Appearance: She is well-developed. She is not diaphoretic.  HENT:     Head: Normocephalic and atraumatic.  Cardiovascular:     Pulses: Normal pulses.  Pulmonary:  Effort: Pulmonary effort is normal.  Abdominal:     Palpations: Abdomen is soft.     Tenderness: There is no abdominal tenderness. There is no guarding or rebound.  Genitourinary:    Comments: No notable stool in rectum Skin:    General: Skin is warm and dry.     Findings: No erythema or rash.  Neurological:     Mental Status: She is alert and oriented to person, place, and time.  Psychiatric:        Behavior: Behavior normal.     ED Results / Procedures / Treatments   Labs (all labs ordered are listed, but only abnormal results are displayed) Labs Reviewed  COMPREHENSIVE METABOLIC PANEL - Abnormal; Notable for the  following components:      Result Value   Total Protein 8.4 (*)    All other components within normal limits  CBC - Abnormal; Notable for the following components:   RBC 3.02 (*)    Hemoglobin 8.6 (*)    HCT 27.5 (*)    RDW 15.9 (*)    Platelets 625 (*)    All other components within normal limits  HEMOGLOBIN AND HEMATOCRIT, BLOOD - Abnormal; Notable for the following components:   Hemoglobin 8.2 (*)    HCT 25.9 (*)    All other components within normal limits  POC OCCULT BLOOD, ED - Abnormal; Notable for the following components:   Fecal Occult Bld POSITIVE (*)    All other components within normal limits  TYPE AND SCREEN    EKG None  Radiology No results found.  Procedures Procedures    Medications Ordered in ED Medications  pantoprazole (PROTONIX) injection 40 mg (40 mg Intravenous Given 06/12/23 0234)  alum & mag hydroxide-simeth (MAALOX/MYLANTA) 200-200-20 MG/5ML suspension 30 mL (30 mLs Oral Given 06/12/23 0234)    And  lidocaine (XYLOCAINE) 2 % viscous mouth solution 15 mL (15 mLs Oral Given 06/12/23 0234)  sodium chloride 0.9 % bolus 1,000 mL (1,000 mLs Intravenous New Bag/Given 06/12/23 0345)  ondansetron (ZOFRAN) injection 4 mg (4 mg Intravenous Given 06/12/23 0345)  morphine (PF) 4 MG/ML injection 4 mg (4 mg Intravenous Given 06/12/23 0348)    ED Course/ Medical Decision Making/ A&P                                 Medical Decision Making Amount and/or Complexity of Data Reviewed Labs: ordered.  Risk OTC drugs. Prescription drug management.   This patient presents to the ED for concern of epigastric pain with single episode of black/sticky stool, this involves an extensive number of treatment options, and is a complaint that carries with it a high risk of complications and morbidity.  The differential diagnosis includes but not limited to perforated ulcer, GI bleed, PUD   Co morbidities that complicate the patient evaluation  H. Pylori, iron deficiency  anemia, gastric ulcer    Additional history obtained:  External records from outside source obtained and reviewed including prior labs on file, prior EGD report   Lab Tests:  I Ordered, and personally interpreted labs.  The pertinent results include:  CMP without significant findings, BUN WNL. CBC with hgb 8.6, not significantly changed form 9.2 on 8/15 visit. Hemoccult +.    Consultations Obtained:  I requested consultation with the ER attending, Dr. Eudelia Bunch,  and discussed lab and imaging findings as well as pertinent plan - they recommend: agrees with plan of  care   Problem List / ED Course / Critical interventions / Medication management  52 year old female returns to the ER with worsening epigastric abdominal pain and concern for dark tarry stool x 1. Patient was seen in the ER for epigastric pain 5 days ago, is taking Omeprazole and Mylanta as directed without any improvement. On exam, well appearing, abdomen is soft and non tender, rectal vault empty, hemoccult negative. Vitals stable, repeat H&H down 1 point from 5 days ago. Discussed results with patient. She is scheduled to see GI in 2 days for endoscopy (Dr. Lavon Paganini). Offered OP follow up with return precautions vs admission. Patient does not want to be in the hospital but is afraid to go home with worsening pain and now dark stool x 1. She has had a long wait in the ER tonight, it is nearly change of shift. Patient would like to hold in the ER to possibly see GI in the morning. Consider dc home if symptoms improve vs admit it not improved. Secure message sent to GI team, care to be signed out to oncoming provider in the morning.  I ordered medication including GI cocktail, Protonix, Zofran, morphine  for pain  Reevaluation of the patient after these medicines showed that the patient stayed the same I have reviewed the patients home medicines and have made adjustments as needed   Social Determinants of Health:  Has PCP and GI  for follow up    Test / Admission - Considered:  Offered admission, patient would like to hold in the ER, will sign out to oncoming provider          Final Clinical Impression(s) / ED Diagnoses Final diagnoses:  Upper GI bleed    Rx / DC Orders ED Discharge Orders     None         Jeannie Fend, PA-C 06/12/23 0517    Nira Conn, MD 06/12/23 [phone]

## 2023-06-12 NOTE — Anesthesia Postprocedure Evaluation (Signed)
Anesthesia Post Note  Patient: Kimberly Bruce  Procedure(s) Performed: ESOPHAGOGASTRODUODENOSCOPY (EGD) HEMOSTASIS CONTROL BIOPSY     Patient location during evaluation: PACU Anesthesia Type: MAC Level of consciousness: awake and alert Pain management: pain level controlled Vital Signs Assessment: post-procedure vital signs reviewed and stable Respiratory status: spontaneous breathing, nonlabored ventilation and respiratory function stable Cardiovascular status: blood pressure returned to baseline Postop Assessment: no apparent nausea or vomiting Anesthetic complications: no   No notable events documented.  Last Vitals:  Vitals:   06/12/23 1145 06/12/23 1153  BP: 112/64 (!) 111/54  Pulse: 79 70  Resp: 18 15  Temp:    SpO2: 100% 98%    Last Pain:  Vitals:   06/12/23 1153  TempSrc:   PainSc: 6                  [name]

## 2023-06-12 NOTE — Op Note (Signed)
[facility] Patient Name: Kimberly Bruce Procedure Date: 06/12/2023 MRN: [id number] Attending MD: Willaim Rayas. Adela Lank , MD, [id number] Date of Birth: [birth year] CSN: [id number] Age: 52 Admit Type: Outpatient Procedure:                Upper GI endoscopy Indications:              Epigastric abdominal pain, Melena - history of                            peptic ulcers in the past with NSAID use and H                            pylori. Recurrent epigastric pain, melena,                            worsening anemia - EGD to further evaluate Providers:                Willaim Rayas. Adela Lank, MD, Stephens Shire RN, RN,                            Priscella Mann, Technician Referring MD:              Medicines:                Monitored Anesthesia Care Complications:            No immediate complications. Estimated blood loss:                            Minimal. Estimated Blood Loss:     Estimated blood loss was minimal. Procedure:                Pre-Anesthesia Assessment:                           - Prior to the procedure, a History and Physical                            was performed, and patient medications and                            allergies were reviewed. The patient's tolerance of                            previous anesthesia was also reviewed. The risks                            and benefits of the procedure and the sedation                            options and risks were discussed with the patient.                            All questions were answered, and informed consent  was obtained. Prior Anticoagulants: The patient has                            taken no anticoagulant or antiplatelet agents. ASA                            Grade Assessment: III - A patient with severe                            systemic disease. After reviewing the risks and                            benefits, the patient was deemed in satisfactory                             condition to undergo the procedure.                           After obtaining informed consent, the endoscope was                            passed under direct vision. Throughout the                            procedure, the patient's blood pressure, pulse, and                            oxygen saturations were monitored continuously. The                            [id number]-[id number] ([id number]) Olympus endoscope was introduced                            through the mouth, and advanced to the second part                            of duodenum. The upper GI endoscopy was                            accomplished without difficulty. The patient                            tolerated the procedure well. Scope In: Scope Out: Findings:      Esophagogastric landmarks were identified: the Z-line was found at 37       cm, the gastroesophageal junction was found at 37 cm and the upper       extent of the gastric folds was found at 38 cm from the incisors.      A small sliding 1-2cm hiatal hernia was present.      The exam of the esophagus was otherwise normal.      One cratered gastric ulcer with multiple red spots was found at the       pyloric channel / duodenal bulb. The lumen in this area was quite  deformed and quite edematous - it was hard to tell if this was primarily       a pyloric channel ulcer or primarily duodenal bulb, I think it involves       both. The lumen of the duodenal bulb was quite narrowed edematous but       able to be traversed with the endoscope without difficulty.       Visualization in the bulb was poor due to the edema but no other obvious       ulcers seen. The ulcer was roughly 15 mm in largest dimension. It was       cratered with numerous red spots at the base of it and adherent heme /       stigmata for recent bleeding. Lavaged without any localized one area for       any significant bleeding, but friable to contact with the endoscope and       with oozing at the  periphery of the lesion. For hemostasis, hemostatic       gel (Purastat) was deployed which covered the entire base of the ulcer.       No bleeding noted following this intervention.      The exam of the stomach was otherwise normal.      Biopsies were taken with a cold forceps for Helicobacter pylori testing.      Severely congested mucosa without active bleeding and with no stigmata       of bleeding was found in the duodenal bulb.      The exam of the duodenum was otherwise normal. Impression:               - Esophagogastric landmarks identified.                           - Small hiatal hernia.                           - Normal esophagus otherwise                           - Large cratered ulcer with multiple red spots and                            stigmata for recent bleeding in the pyloric channel                            / duodenal bulb as outlined. Hemostatic gel                            (Purastat) applied with good result.                           - Normal stomach otherwise - biopsies taken to rule                            out H pylori                           - Edematous duodenal bulb                           -  Normal remaining duodenum.                           Unclear what is causing this ulcer - she denies                            recent NSAID use that she is aware of. Ruling out H                            pylori. If negative, consideration for gastrin                            testing as outpatient given recurent PUD, and that                            this developed on moderate dose omeprazole. Moderate Sedation:      No moderate sedation, case performed with MAC Recommendation:           - Return patient to hospital ward for ongoing care.                           - NPO for today, clear liquids tomorrow if she does                            well the rest of today.                           - IV protonix 40mg  twice daily for now, upon                             discharge she should be on 40mg  PO BID for several                            weeks until a follow up EGD can be done to confirm                            mucosal healing                           - Oral carafate tablet 1gm every 6 hours - this may                            make her stool dark                           - Continue present medications.                           - Avoid all NSAIDs                           - Await pathology results.                           -  GI service will continue to follow (Dr. Leone Payor                            the rest of this week) and reassess her tomorrow Procedure Code(s):        --- Professional ---                           [phone], 59, Esophagogastroduodenoscopy, flexible,                            transoral; with control of bleeding, any method                           43239, Esophagogastroduodenoscopy, flexible,                            transoral; with biopsy, single or multiple Diagnosis Code(s):        --- Professional ---                           K44.9, Diaphragmatic hernia without obstruction or                            gangrene                           K31.89, Other diseases of stomach and duodenum                           R10.13, Epigastric pain                           K92.1, Melena (includes Hematochezia) CPT copyright 2022 [employer]. All rights reserved. The codes documented in this report are preliminary and upon coder review may  be revised to meet current compliance requirements. Viviann Spare P. Adela Lank, MD 06/12/2023 11:46:12 AM This report has been signed electronically. Number of Addenda: 0

## 2023-06-12 NOTE — H&P (Signed)
History and Physical  [name] ZOX:[id number] DOB: [birth year] DOA: 06/11/2023  PCP: [facility]   Chief Complaint: abd pain, melena   HPI: Kimberly Bruce is a 52 y.o. female with medical history significant for hypertension, anxiety, peptic ulcer disease being admitted to the hospital with melena and blood loss anemia.  Patient has a prior history of peptic ulcer disease, was treated for H. pylori earlier this year but admits she did not complete the therapy.  More recently, she has had recurrent abdominal discomfort and some melena, for which she saw gastroenterology was started on PPI and antibiotics.  She has not been taking this.  Was seen in the emergency department on 8/15 due to complaints of abdominal pain, felt better after Maalox and was discharged home to follow-up with gastroenterology who already planned EGD.  Her abdominal pain has continued, and last evening she started having melanotic stools so came back to the ER for evaluation.  On evaluation here, she is hemodynamically stable, labs significant for hemoglobin 8.2 down slightly from 9.2 on 8/15.  Patient denies any fevers, cough, nausea, chest pain, hematochezia.  ER provider discussed with gastroenterology, who recommends hospital observation and will likely do EGD in the next 24 hours.  Review of Systems: Please see HPI for pertinent positives and negatives. A complete 10 system review of systems are otherwise negative.  Past Medical History:  Diagnosis Date   Anemia    since teenage years   Anxiety 1996   Blood transfusion without reported diagnosis    Hiatal hernia    Insomnia    Ovarian cyst    Upper GI bleed    Past Surgical History:  Procedure Laterality Date   BIOPSY  11/04/2019   Procedure: BIOPSY;  Surgeon: Napoleon Form, MD;  Location: [facility] ENDOSCOPY;  Service: Endoscopy;;   CESAREAN SECTION     x2    COLONOSCOPY WITH PROPOFOL N/A 11/04/2019   Procedure: COLONOSCOPY WITH PROPOFOL;  Surgeon:  Napoleon Form, MD;  Location: [facility] ENDOSCOPY;  Service: Endoscopy;  Laterality: N/A;   ESOPHAGOGASTRODUODENOSCOPY N/A 02/21/2016   Procedure: ESOPHAGOGASTRODUODENOSCOPY (EGD);  Surgeon: Napoleon Form, MD;  Location[facility] ENDOSCOPY;  Service: Endoscopy;  Laterality: N/A;   ESOPHAGOGASTRODUODENOSCOPY (EGD) WITH PROPOFOL N/A 03/22/2019   Procedure: ESOPHAGOGASTRODUODENOSCOPY (EGD) WITH PROPOFOL;  Surgeon: Hilarie Fredrickson, MD;  Location: [facility] ENDOSCOPY;  Service: Endoscopy;  Laterality: N/A;  10am EGD with Dr. Marina Goodell    ESOPHAGOGASTRODUODENOSCOPY (EGD) WITH PROPOFOL N/A 11/04/2019   Procedure: ESOPHAGOGASTRODUODENOSCOPY (EGD) WITH PROPOFOL;  Surgeon: Napoleon Form, MD;  Location: [facility] ENDOSCOPY;  Service: Endoscopy;  Laterality: N/A;  difficult IV access   HEMOSTASIS CONTROL  03/22/2019   Procedure: HEMOSTASIS CONTROL;  Surgeon: Hilarie Fredrickson, MD;  Location: [facility] ENDOSCOPY;  Service: Endoscopy;;   HOT HEMOSTASIS N/A 03/22/2019   Procedure: HOT HEMOSTASIS (ARGON PLASMA COAGULATION/BICAP);  Surgeon: Hilarie Fredrickson, MD;  Location: [facility];  Service: Endoscopy;  Laterality: N/A;   LAPAROSCOPY N/A 01/30/2014   Procedure: LAPAROSCOPY DIAGNOSTIC;  Surgeon: Lenoard Aden, MD;  Location: [facility] ORS;  Service: Gynecology;  Laterality: N/A;   ROBOTIC ASSISTED LAPAROSCOPIC LYSIS OF ADHESION N/A 01/30/2014   Procedure: Possible ROBOTIC ASSISTED LAPAROSCOPIC LYSIS OF ADHESION WITH LEFT SALPINGO OOPHORECTOMY;  Surgeon: Lenoard Aden, MD;  Location: [facility];  Service: Gynecology;  Laterality: N/A;   WISDOM TOOTH EXTRACTION      Social History:  reports that she has never smoked. She has never used smokeless tobacco. She reports that she  does not drink alcohol and does not use drugs.   Allergies  Allergen Reactions   Citalopram Nausea And Vomiting    Family History  Problem Relation Age of Onset   Aneurysm Father        brain   Hepatitis C Father    Breast cancer Maternal Aunt        breast   Cancer  Maternal Uncle        prostate?   Heart disease Paternal Grandmother    Alcohol abuse Maternal Grandmother    Other Paternal Grandfather        unknown   Lung cancer Sister 57       died   Colon cancer Neg Hx    Esophageal cancer Neg Hx    Stomach cancer Neg Hx    Rectal cancer Neg Hx    Colon polyps Neg Hx      Prior to Admission medications   Medication Sig Start Date End Date Taking? Authorizing Provider  aluminum-magnesium hydroxide 200-200 MG/5ML suspension Take 15 mLs by mouth every 6 (six) hours as needed for up to 7 days for indigestion (for abdominal pain). 06/08/23 06/15/23 Yes Arabella Merles, PA-C  amoxicillin (AMOXIL) 500 MG capsule Take 1,000 mg by mouth 2 (two) times daily.   Yes [provider]  clarithromycin (BIAXIN) 500 MG tablet Take 500 mg by mouth 2 (two) times daily.   Yes [provider]  hydrochlorothiazide (HYDRODIURIL) 12.5 MG tablet Take 12.5 mg by mouth daily as needed. 03/22/23  Yes [provider]  phentermine 30 MG capsule Take 30 mg by mouth daily. 05/21/23  Yes [provider]  propranolol (INDERAL) 20 MG tablet Take 20 mg by mouth every evening. 03/22/23  Yes [provider]  Vitamin D, Ergocalciferol, (DRISDOL) 1.25 MG (50000 UNIT) CAPS capsule Take 50,000 Units by mouth once a week. 03/14/23  Yes [provider]  zolpidem (AMBIEN) 10 MG tablet Take 10 mg by mouth at bedtime as needed for sleep. 11/24/19  Yes [provider]  amoxicillin (AMOXIL) 500 MG capsule Take 2 capsules (1,000 mg total) by mouth 2 (two) times daily. Patient not taking: Reported on 03/23/2023 01/12/23   Al Decant, PA-C  clarithromycin (BIAXIN) 500 MG tablet Take 1 tablet (500 mg total) by mouth 2 (two) times daily. Patient not taking: Reported on 03/23/2023 01/12/23   Al Decant, PA-C  omeprazole (PRILOSEC) 40 MG capsule Take 1 capsule (40 mg total) by mouth daily. Patient not taking: Reported on 06/12/2023  03/23/23   Doree Albee, PA-C  sucralfate (CARAFATE) 1 g tablet Take 1 tablet (1 g total) by mouth 4 (four) times daily -  with meals and at bedtime. Patient not taking: Reported on 06/12/2023 01/12/23   Clent Ridges    Physical Exam: BP 121/73   Pulse 89   Temp 98.9 F (37.2 C)   Resp 19   Ht 4\' 11"  (1.499 m)   Wt 73 kg   LMP 04/12/2017   SpO2 100%   BMI 32.51 kg/m   General:  Alert, oriented, calm, in no acute distress  Eyes: EOMI, clear conjuctivae, white sclerea Neck: supple, no masses, trachea mildline  Cardiovascular: RRR, no murmurs or rubs, no peripheral edema  Respiratory: clear to auscultation bilaterally, no wheezes, no crackles  Abdomen: soft, epigastric tenderness, nondistended, normal bowel tones heard  Skin: dry, no rashes  Musculoskeletal: no joint effusions, normal range of motion  Psychiatric: appropriate affect, normal  speech  Neurologic: extraocular muscles intact, clear speech, moving all extremities with intact sensorium          Labs on Admission:  Basic Metabolic Panel: Recent Labs  Lab 06/07/23 1843 06/07/23 2344 06/11/23 1907  NA 134* 135 136  K 3.9 4.0 3.6  CL 100 105 101  CO2 25 24 25   GLUCOSE 86 85 97  BUN 18 16 13   CREATININE 1.42* 1.08* 0.89  CALCIUM 9.1 8.6* 9.2   Liver Function Tests: Recent Labs  Lab 06/07/23 1843 06/11/23 1907  AST 17 20  ALT 7 9  ALKPHOS 78 75  BILITOT 0.5 0.5  PROT 7.7 8.4*  ALBUMIN 4.0 4.0   Recent Labs  Lab 06/07/23 1843  LIPASE 22   No results for input(s): "AMMONIA" in the last 168 hours. CBC: Recent Labs  Lab 06/07/23 1843 06/11/23 1907 06/12/23 0234  WBC 8.5 7.1  --   HGB 9.2* 8.6* 8.2*  HCT 29.3* 27.5* 25.9*  MCV 92.4 91.1  --   PLT 483* 625*  --    Cardiac Enzymes: No results for input(s): "CKTOTAL", "CKMB", "CKMBINDEX", "TROPONINI" in the last 168 hours.  BNP (last 3 results) No results for input(s): "BNP" in the last 8760 hours.  ProBNP (last 3 results) No  results for input(s): "PROBNP" in the last 8760 hours.  CBG: No results for input(s): "GLUCAP" in the last 168 hours.  Radiological Exams on Admission: No results found.  Assessment/Plan This is a pleasant 52 year old female with history of hypertension, anxiety, medication noncompliance, prior treatment for H. pylori PUD being admitted to the hospital with abdominal pain, melena, blood loss anemia and concern for recurrent peptic ulcer disease.  Abdominal pain and melena-concerning for upper GI bleed, patient may have recurrent peptic ulcer disease and potentially incompletely treated H. pylori infection -Observation admission to MedSurg -Keep n.p.o. for now -IV PPI -Gentle IV fluids -Appreciate GI intervention and recommendations  Hypertension-continue home propranolol  Blood loss anemia-hemoglobin has fallen from 9.2-8.2 in the last 5 days.  She is hemodynamically stable, without any evidence of brisk bleeding. -Avoid blood thinners -Continue to trend hemoglobin  DVT prophylaxis: SCDs only    Code Status: Full Code  Consults called: GI  Admission status: Observation   Time spent: 46 minutes  Katai Marsico Sharlette Dense MD [facility] Pager [phone]  If 7PM-7AM, please contact night-coverage [email or web address] Password [facility]  06/12/2023, 8:53 AM

## 2023-06-12 NOTE — ED Provider Notes (Signed)
Care transferred from Army Melia, PA-C at time of sign out. See their note for full assessment.   Briefly: Patient is 53 y.o. female who presents to the ED with epigastric pain and melena x 1.    Plan: Plan per previous PA-C: pending GI consult and recommendations. Disposition as per GI recommendations.  Labs Reviewed  COMPREHENSIVE METABOLIC PANEL - Abnormal; Notable for the following components:      Result Value   Total Protein 8.4 (*)    All other components within normal limits  CBC - Abnormal; Notable for the following components:   RBC 3.02 (*)    Hemoglobin 8.6 (*)    HCT 27.5 (*)    RDW 15.9 (*)    Platelets 625 (*)    All other components within normal limits  HEMOGLOBIN AND HEMATOCRIT, BLOOD - Abnormal; Notable for the following components:   Hemoglobin 8.2 (*)    HCT 25.9 (*)    All other components within normal limits  POC OCCULT BLOOD, ED - Abnormal; Notable for the following components:   Fecal Occult Bld POSITIVE (*)    All other components within normal limits  TYPE AND SCREEN    Clinical Course as of 06/12/23 0857  Tue Jun 12, 2023  0656 Pt re-evaluated and patient resting comfortably. Discussed with patient plans for consult with GI. Answered all available questions. Pt agreeable. [SB]  [id number] Consult with Hospitalist, Dr. Erenest Blank who will evaluate patient for admission. [SB]    Clinical Course User Index [SB] Zadkiel Dragan A, PA-C     Discussed with patient plans for admission as per GI recommendations.  Patient agreeable at this time.  Answered all verbal questions.  Patient appears safe for admission at this time.   This chart was dictated using voice recognition software, Dragon. Despite the best efforts of this provider to proofread and correct errors, errors may still occur which can change documentation meaning.  ED Discharge Orders     None        Joshlynn Alfonzo A, PA-C 06/12/23 1610    Gerhard Munch, MD 06/12/23 [phone]

## 2023-06-12 NOTE — Plan of Care (Signed)
Per Dr. Adela Lank this PM:  EGD with large cratered ulcer with stigmata of bleeding in the pyloric channel / duodenal bulb.  NPO today, clears in AM if stable.  Took biopsies for H pylori.   Increased protonix to 40mg  BID and started carafate every 6 hours.

## 2023-06-12 NOTE — Consult Note (Signed)
Consultation  Referring Provider:  [facility]  Primary Care Physician:  [facility] Primary Gastroenterologist:  Dr. Lavon Paganini       Reason for Consultation:     UGIB  LOS: 0 days          HPI:   Kimberly Bruce is a 52 y.o. female with past medical history significant for H. 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  Seen in office 03/23/2023 by Quentin Mulling, PA-C.  At that time she was having severe epigastric pain ongoing since March without history of NSAIDs.  CT abdomen pelvis showed duodenitis/possible pancreatitis with negative lipase.  Had improvement with Carafate.  She was put on omeprazole 40 Mg once daily 03/23/2023 and set up for EGD scheduled 06/14/2023 with Dr. Lavon Paganini  Patient presented to emergency department 06/07/2023 with epigastric pain.  Was given GI cocktail and discharged with recommendation to return if melena occurred.  Patient presented to emergency department 8/19 with melena and upper abdominal pain.  Denies Pepto or iron supplement.  Hgb 8.2 (9.2 06/07/23) Hemoccult positive BUN 13, creatinine 0.9  Patient reports continued intermittent epigastric pain not necessarily associated with eating.  No nausea or vomiting.  Reports 1 large-volume episode of melena yesterday evening which prompted her visit to the ED.  No NSAID use.  Denies tobacco/alcohol use.  Patient states her symptoms somewhat improved on omeprazole 40 Mg once daily.  Then did not work as well as it previously had and her symptoms became progressively worse.   Previous GI workup  11/04/2019 EGD and colonoscopy with Dr. Lavon Paganini at [facility] for iron deficiency anemia, dyspepsia EGD normal esophagus, small hiatal hernia, patchy inflammation erythema entire stomach, normal duodenum.  Path negative for H. pylori Colonoscopy showed excellent bowel prep nonbleeding internal hemorrhoids otherwise unremarkable  recall 10 years.  Past Medical History:  Diagnosis Date   Anemia    since teenage years   Anxiety 1996   Blood transfusion without reported diagnosis    Hiatal hernia    Insomnia    Ovarian cyst    Upper GI bleed     Surgical History:  She  has a past surgical history that includes Cesarean section; Wisdom tooth extraction; laparoscopy (N/A, 01/30/2014); Robotic assisted laparoscopic lysis of adhesion (N/A, 01/30/2014); Esophagogastroduodenoscopy (N/A, 02/21/2016); Esophagogastroduodenoscopy (egd) with propofol (N/A, 03/22/2019); Hemostasis control (03/22/2019); Hot hemostasis (N/A, 03/22/2019); Esophagogastroduodenoscopy (egd) with propofol (N/A, 11/04/2019); Colonoscopy with propofol (N/A, 11/04/2019); and biopsy (11/04/2019). Family History:  Her family history includes Alcohol abuse in her maternal grandmother; Aneurysm in her father; Breast cancer in her maternal aunt; Cancer in her maternal uncle; Heart disease in her paternal grandmother; Hepatitis C in her father; Lung cancer (age of onset: 60) in her sister; Other in her paternal grandfather. Social History:   reports that she has never smoked. She has never used smokeless tobacco. She reports that she does not drink alcohol and does not use drugs.  Prior to Admission medications   Medication Sig Start Date End Date Taking? Authorizing Provider  aluminum-magnesium hydroxide 200-200 MG/5ML suspension Take 15 mLs by mouth every 6 (six) hours as needed for up to 7 days for indigestion (for abdominal pain). 06/08/23 06/15/23 Yes Arabella Merles, PA-C  amoxicillin (AMOXIL) 500 MG capsule Take 1,000 mg by mouth 2 (two) times daily.   Yes [provider]  clarithromycin (BIAXIN) 500 MG tablet Take 500 mg by mouth 2 (  two) times daily.   Yes [provider]  hydrochlorothiazide (HYDRODIURIL) 12.5 MG tablet Take 12.5 mg by mouth daily as needed. 03/22/23  Yes [provider]  phentermine 30 MG capsule Take 30 mg by mouth  daily. 05/21/23  Yes [provider]  propranolol (INDERAL) 20 MG tablet Take 20 mg by mouth every evening. 03/22/23  Yes [provider]  Vitamin D, Ergocalciferol, (DRISDOL) 1.25 MG (50000 UNIT) CAPS capsule Take 50,000 Units by mouth once a week. 03/14/23  Yes [provider]  zolpidem (AMBIEN) 10 MG tablet Take 10 mg by mouth at bedtime as needed for sleep. 11/24/19  Yes [provider]  amoxicillin (AMOXIL) 500 MG capsule Take 2 capsules (1,000 mg total) by mouth 2 (two) times daily. Patient not taking: Reported on 03/23/2023 01/12/23   Al Decant, PA-C  clarithromycin (BIAXIN) 500 MG tablet Take 1 tablet (500 mg total) by mouth 2 (two) times daily. Patient not taking: Reported on 03/23/2023 01/12/23   Al Decant, PA-C  omeprazole (PRILOSEC) 40 MG capsule Take 1 capsule (40 mg total) by mouth daily. Patient not taking: Reported on 06/12/2023 03/23/23   Doree Albee, PA-C  sucralfate (CARAFATE) 1 g tablet Take 1 tablet (1 g total) by mouth 4 (four) times daily -  with meals and at bedtime. Patient not taking: Reported on 06/12/2023 01/12/23   Al Decant, PA-C    Current Facility-Administered Medications  Medication Dose Route Frequency Provider Last Rate Last Admin   0.9 %  sodium chloride infusion  500 mL Intravenous Once Napoleon Form, MD       Current Outpatient Medications  Medication Sig Dispense Refill   aluminum-magnesium hydroxide 200-200 MG/5ML suspension Take 15 mLs by mouth every 6 (six) hours as needed for up to 7 days for indigestion (for abdominal pain). 355 mL 0   amoxicillin (AMOXIL) 500 MG capsule Take 1,000 mg by mouth 2 (two) times daily.     clarithromycin (BIAXIN) 500 MG tablet Take 500 mg by mouth 2 (two) times daily.     hydrochlorothiazide (HYDRODIURIL) 12.5 MG tablet Take 12.5 mg by mouth daily as needed.     phentermine 30 MG capsule Take 30 mg by mouth daily.     propranolol (INDERAL) 20 MG tablet  Take 20 mg by mouth every evening.     Vitamin D, Ergocalciferol, (DRISDOL) 1.25 MG (50000 UNIT) CAPS capsule Take 50,000 Units by mouth once a week.     zolpidem (AMBIEN) 10 MG tablet Take 10 mg by mouth at bedtime as needed for sleep.     amoxicillin (AMOXIL) 500 MG capsule Take 2 capsules (1,000 mg total) by mouth 2 (two) times daily. (Patient not taking: Reported on 03/23/2023) 40 capsule 0   clarithromycin (BIAXIN) 500 MG tablet Take 1 tablet (500 mg total) by mouth 2 (two) times daily. (Patient not taking: Reported on 03/23/2023) 20 tablet 0   omeprazole (PRILOSEC) 40 MG capsule Take 1 capsule (40 mg total) by mouth daily. (Patient not taking: Reported on 06/12/2023) 30 capsule 3   sucralfate (CARAFATE) 1 g tablet Take 1 tablet (1 g total) by mouth 4 (four) times daily -  with meals and at bedtime. (Patient not taking: Reported on 06/12/2023) 60 tablet 0    Allergies as of 06/11/2023 - Review Complete 06/11/2023  Allergen Reaction Noted   Citalopram Nausea And Vomiting 04/07/2013    Review of Systems  Constitutional:  Negative for chills, fever and weight loss.  HENT:  Negative for hearing loss and tinnitus.   Eyes:  Negative for blurred vision and double vision.  Respiratory:  Negative for cough and hemoptysis.   Cardiovascular:  Negative for chest pain and palpitations.  Gastrointestinal:  Positive for abdominal pain and melena. Negative for blood in stool, constipation, diarrhea, heartburn, nausea and vomiting.  Genitourinary:  Negative for dysuria and urgency.  Musculoskeletal:  Negative for myalgias and neck pain.  Skin:  Negative for itching and rash.  Neurological:  Negative for seizures and loss of consciousness.  Psychiatric/Behavioral:  Negative for depression and suicidal ideas.        Physical Exam:  Vital signs in last 24 hours: Temp:  [98.6 F (37 C)-98.9 F (37.2 C)] 98.9 F (37.2 C) (08/20 0700) Pulse Rate:  [69-90] 71 (08/20 0700) Resp:  [16-18] 17 (08/20  0700) BP: (116-142)/(62-92) 116/62 (08/20 0700) SpO2:  [99 %-100 %] 100 % (08/20 0700) Weight:  [73 kg] 73 kg (08/19 1838)   Last BM recorded by nurses in past 5 days No data recorded  Physical Exam Constitutional:      Appearance: Normal appearance.  HENT:     Head: Normocephalic and atraumatic.     Nose: Nose normal. No congestion.     Mouth/Throat:     Mouth: Mucous membranes are moist.     Pharynx: Oropharynx is clear.  Eyes:     General: No scleral icterus.    Extraocular Movements: Extraocular movements intact.  Cardiovascular:     Rate and Rhythm: Normal rate and regular rhythm.  Pulmonary:     Effort: Pulmonary effort is normal. No respiratory distress.  Abdominal:     General: Abdomen is flat. Bowel sounds are normal. There is no distension.     Palpations: Abdomen is soft. There is no mass.     Tenderness: There is no abdominal tenderness. There is no guarding or rebound.     Hernia: No hernia is present.  Musculoskeletal:        General: No swelling. Normal range of motion.     Cervical back: Normal range of motion and neck supple.  Skin:    General: Skin is warm and dry.     Coloration: Skin is not jaundiced.  Neurological:     General: No focal deficit present.     Mental Status: She is alert and oriented to person, place, and time.  Psychiatric:        Mood and Affect: Mood normal.        Behavior: Behavior normal.        Thought Content: Thought content normal.        Judgment: Judgment normal.      LAB RESULTS: Recent Labs    06/11/23 1907 06/12/23 0234  WBC 7.1  --   HGB 8.6* 8.2*  HCT 27.5* 25.9*  PLT 625*  --    BMET Recent Labs    06/11/23 1907  NA 136  K 3.6  CL 101  CO2 25  GLUCOSE 97  BUN 13  CREATININE 0.89  CALCIUM 9.2   LFT Recent Labs    06/11/23 1907  PROT 8.4*  ALBUMIN 4.0  AST 20  ALT 9  ALKPHOS 75  BILITOT 0.5   PT/INR No results for input(s): "LABPROT", "INR" in the last 72 hours.  STUDIES: No results  found.    Impression    Kimberly Bruce is a 52 y.o. female with past medical history significant for H. 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  Upper GI Bleed  Hgb 8.2 (9.2 06/07/23) Hemoccult positive BUN 13, creatinine 0.9 Persistent epigastric pain ongoing since March 2024 with minimal initial improvement on omeprazole 40 Mg now becoming more persistent with 1 large-volume episode of melena.  No NSAID use.    Plan   Plan for EGD today. I thoroughly discussed the procedures to include nature, alternatives, benefits, and risks including but not limited to bleeding, perforation, infection, anesthesia/cardiac and pulmonary complications. Patient provides understanding and gave verbal consent to proceed. Continue Protonix IV 40mg  BID NPO Continue daily CBC and transfuse as needed to maintain HGB > 7    Thank you for your kind consultation, we will continue to follow.   Legrand Como  06/12/2023, 8:43 AM

## 2023-06-12 NOTE — Transfer of Care (Signed)
Immediate Anesthesia Transfer of Care Note  Patient: Kimberly Bruce  Procedure(s) Performed: Procedure(s): ESOPHAGOGASTRODUODENOSCOPY (EGD) (N/A) HEMOSTASIS CONTROL BIOPSY  Patient Location: Endoscopy Unit  Anesthesia Type:MAC  Level of Consciousness:  sedated, patient cooperative and responds to stimulation  Airway & Oxygen Therapy:Patient Spontanous Breathing and Patient connected to face mask oxgen  Post-op Assessment:  Report given to PACU RN and Post -op Vital signs reviewed and stable  Post vital signs:  Reviewed and stable  Last Vitals:  Vitals:   06/12/23 0800 06/12/23 1025  BP: 121/73 125/66  Pulse: 89 69  Resp: 19 14  Temp:  (!) 36.1 C  SpO2: 100% 97%    Complications: No apparent anesthesia complications

## 2023-06-12 NOTE — Anesthesia Preprocedure Evaluation (Addendum)
Anesthesia Evaluation  Patient identified by MRN, date of birth, ID band Patient awake    Reviewed: Allergy & Precautions, H&P , NPO status , Patient's Chart, lab work & pertinent test results  History of Anesthesia Complications Negative for: history of anesthetic complications  Airway Mallampati: II   Neck ROM: full    Dental   Pulmonary neg pulmonary ROS   breath sounds clear to auscultation       Cardiovascular hypertension, Pt. on medications  Rhythm:regular Rate:Normal     Neuro/Psych   Anxiety        GI/Hepatic Neg liver ROS, hiatal hernia, PUD,GERD  Medicated,,melena, anemia, abnormal CT showing duodenitis   Endo/Other  negative endocrine ROS    Renal/GU negative Renal ROS     Musculoskeletal negative musculoskeletal ROS (+)    Abdominal   Peds  Hematology  (+) Blood dyscrasia (Hgb 8.2), anemia   Anesthesia Other Findings Day of surgery medications reviewed with patient.  Reproductive/Obstetrics                             Anesthesia Physical Anesthesia Plan  ASA: 3  Anesthesia Plan: MAC   Post-op Pain Management: Minimal or no pain anticipated   Induction: Intravenous  PONV Risk Score and Plan: 2 and Treatment may vary due to age or medical condition and Propofol infusion  Airway Management Planned: Natural Airway and Nasal Cannula  Additional Equipment: None  Intra-op Plan:   Post-operative Plan:   Informed Consent: I have reviewed the patients History and Physical, chart, labs and discussed the procedure including the risks, benefits and alternatives for the proposed anesthesia with the patient or authorized representative who has indicated his/her understanding and acceptance.     Dental advisory given  Plan Discussed with: CRNA, Anesthesiologist and Surgeon  Anesthesia Plan Comments:        Anesthesia Quick Evaluation

## 2023-06-12 NOTE — Plan of Care (Signed)

## 2023-06-12 NOTE — ED Notes (Signed)
ED TO INPATIENT HANDOFF REPORT  ED Nurse Name and Phone #: Suzanna Obey [phone]  S Name/Age/Gender Kimberly Bruce 52 y.o. female Room/Bed: WA12/WA12  Code Status   Code Status: Full Code  Home/SNF/Other Home Patient oriented to: self, place, time, and situation Is this baseline? Yes   Triage Complete: Triage complete  Chief Complaint Upper GI bleed [K92.2] Anemia, unspecified type [D64.9]  Triage Note Patient began having dark bloody stools this morning. Complaining of generalized upper abdominal pain.    Allergies Allergies  Allergen Reactions   Citalopram Nausea And Vomiting    Level of Care/Admitting Diagnosis ED Disposition     ED Disposition  Admit   Condition  --   Comment  Hospital Area: [facility] [[id number]]  Level of Care: Med-Surg [16]  May place patient in observation at [facility] or [facility] if equivalent level of care is available:: Yes  Covid Evaluation: Asymptomatic - no recent exposure (last 10 days) testing not required  Diagnosis: Upper GI bleed [267195]  Admitting Physician: Maryln Gottron [[id number]]  Attending Physician: Kirby Crigler, MIR M [[id number]]          B Medical/Surgery History Past Medical History:  Diagnosis Date   Anemia    since teenage years   Anxiety 1996   Blood transfusion without reported diagnosis    Hiatal hernia    Insomnia    Ovarian cyst    Upper GI bleed    Past Surgical History:  Procedure Laterality Date   BIOPSY  11/04/2019   Procedure: BIOPSY;  Surgeon: Napoleon Form, MD;  Location: [facility];  Service: Endoscopy;;   CESAREAN SECTION     x2    COLONOSCOPY WITH PROPOFOL N/A 11/04/2019   Procedure: COLONOSCOPY WITH PROPOFOL;  Surgeon: Napoleon Form, MD;  Location: [facility];  Service: Endoscopy;  Laterality: N/A;   ESOPHAGOGASTRODUODENOSCOPY N/A 02/21/2016   Procedure: ESOPHAGOGASTRODUODENOSCOPY (EGD);  Surgeon: Napoleon Form, MD;  Location: [facility];   Service: Endoscopy;  Laterality: N/A;   ESOPHAGOGASTRODUODENOSCOPY (EGD) WITH PROPOFOL N/A 03/22/2019   Procedure: ESOPHAGOGASTRODUODENOSCOPY (EGD) WITH PROPOFOL;  Surgeon: Hilarie Fredrickson, MD;  Location: [facility];  Service: Endoscopy;  Laterality: N/A;  10am EGD with Dr. Marina Goodell    ESOPHAGOGASTRODUODENOSCOPY (EGD) WITH PROPOFOL N/A 11/04/2019   Procedure: ESOPHAGOGASTRODUODENOSCOPY (EGD) WITH PROPOFOL;  Surgeon: Napoleon Form, MD;  Location: [facility];  Service: Endoscopy;  Laterality: N/A;  difficult IV access   HEMOSTASIS CONTROL  03/22/2019   Procedure: HEMOSTASIS CONTROL;  Surgeon: Hilarie Fredrickson, MD;  Location: [facility];  Service: Endoscopy;;   HOT HEMOSTASIS N/A 03/22/2019   Procedure: HOT HEMOSTASIS (ARGON PLASMA COAGULATION/BICAP);  Surgeon: Hilarie Fredrickson, MD;  Location: [facility];  Service: Endoscopy;  Laterality: N/A;   LAPAROSCOPY N/A 01/30/2014   Procedure: LAPAROSCOPY DIAGNOSTIC;  Surgeon: Lenoard Aden, MD;  Location: [facility];  Service: Gynecology;  Laterality: N/A;   ROBOTIC ASSISTED LAPAROSCOPIC LYSIS OF ADHESION N/A 01/30/2014   Procedure: Possible ROBOTIC ASSISTED LAPAROSCOPIC LYSIS OF ADHESION WITH LEFT SALPINGO OOPHORECTOMY;  Surgeon: Lenoard Aden, MD;  Location: [facility];  Service: Gynecology;  Laterality: N/A;   WISDOM TOOTH EXTRACTION       A IV Location/Drains/Wounds Patient Lines/Drains/Airways Status     Active Line/Drains/Airways     Name Placement date Placement time Site Days   Peripheral IV 06/12/23 20 G Anterior;Left Forearm 06/12/23  0232  Forearm  less than 1   Incision - 5 Ports Abdomen 1: Umbilicus  2: Right;Medial 3: Right;Lateral 4: Left;Medial 5: Left;Lateral 01/30/14  1410  -- 3420            Intake/Output Last 24 hours No intake or output data in the 24 hours ending 06/12/23 1311  Labs/Imaging Results for orders placed or performed during the hospital encounter of 06/11/23 (from the past 48 hour(s))  Comprehensive metabolic  panel     Status: Abnormal   Collection Time: 06/11/23  7:07 PM  Result Value Ref Range   Sodium 136 135 - 145 mmol/L   Potassium 3.6 3.5 - 5.1 mmol/L   Chloride 101 98 - 111 mmol/L   CO2 25 22 - 32 mmol/L   Glucose, Bld 97 70 - 99 mg/dL    Comment: Glucose reference range applies only to samples taken after fasting for at least 8 hours.   BUN 13 6 - 20 mg/dL   Creatinine, Ser 1.61 0.44 - 1.00 mg/dL   Calcium 9.2 8.9 - 09.6 mg/dL   Total Protein 8.4 (H) 6.5 - 8.1 g/dL   Albumin 4.0 3.5 - 5.0 g/dL   AST 20 15 - 41 U/L   ALT 9 0 - 44 U/L   Alkaline Phosphatase 75 38 - 126 U/L   Total Bilirubin 0.5 0.3 - 1.2 mg/dL   GFR, Estimated >04 >54 mL/min    Comment: (NOTE) Calculated using the CKD-EPI Creatinine Equation (2021)    Anion gap 10 5 - 15    Comment: Performed at [facility], [address] [place], [place] [address]  CBC     Status: Abnormal   Collection Time: 06/11/23  7:07 PM  Result Value Ref Range   WBC 7.1 4.0 - 10.5 K/uL   RBC 3.02 (L) 3.87 - 5.11 MIL/uL   Hemoglobin 8.6 (L) 12.0 - 15.0 g/dL   HCT 91.4 (L) 78.2 - 95.6 %   MCV 91.1 80.0 - 100.0 fL   MCH 28.5 26.0 - 34.0 pg   MCHC 31.3 30.0 - 36.0 g/dL   RDW 21.3 (H) 08.6 - 57.8 %   Platelets 625 (H) 150 - 400 K/uL   nRBC 0.0 0.0 - 0.2 %    Comment: Performed at [facility], [address] [place], [place] [address]  Type and screen [facility]     Status: None   Collection Time: 06/11/23  7:07 PM  Result Value Ref Range   ABO/RH(D) O POS    Antibody Screen NEG    Sample Expiration      06/14/2023,2359 Performed at [facility], [address], [place], [place] [address]   POC occult blood, ED     Status: Abnormal   Collection Time: 06/12/23  1:16 AM  Result Value Ref Range   Fecal Occult Bld POSITIVE (A) NEGATIVE  Hemoglobin and hematocrit, blood     Status: Abnormal   Collection Time: 06/12/23  2:34 AM  Result Value Ref Range    Hemoglobin 8.2 (L) 12.0 - 15.0 g/dL   HCT 13.2 (L) 44.0 - 10.2 %    Comment: Performed at [facility], [address], [place], [place] [address]   No results found.  Pending Labs Unresulted Labs (From admission, onward)     Start     Ordered   06/13/23 0500  Basic metabolic panel  Tomorrow morning,   R        06/12/23 0852   06/13/23 0500  CBC  Tomorrow morning,  R        06/12/23 0852   06/12/23 0852  HIV Antibody (routine testing w rflx)  (HIV Antibody (Routine testing w reflex) panel)  Once,   R        06/12/23 0852            Vitals/Pain Today's Vitals   06/12/23 1153 06/12/23 1155 06/12/23 1203 06/12/23 1300  BP: (!) 111/54 (!) 111/54 111/77 126/73  Pulse: 70 70 73 67  Resp: 15 15 19 18   Temp:   98.2 F (36.8 C)   TempSrc:   Oral   SpO2: 98% 98% 100% 100%  Weight:      Height:      PainSc: 6        Isolation Precautions No active isolations  Medications Medications  propranolol (INDERAL) tablet 20 mg ( Oral MAR Unhold 06/12/23 1158)  zolpidem (AMBIEN) tablet 5 mg ( Oral MAR Unhold 06/12/23 1158)  acetaminophen (TYLENOL) tablet 650 mg ( Oral MAR Unhold 06/12/23 1158)    Or  acetaminophen (TYLENOL) suppository 650 mg ( Rectal MAR Unhold 06/12/23 1158)  0.9 %  sodium chloride infusion (0 mLs Intravenous Stopped 06/12/23 1224)  ondansetron (ZOFRAN) tablet 4 mg ( Oral MAR Unhold 06/12/23 1158)    Or  ondansetron (ZOFRAN) injection 4 mg ( Intravenous MAR Unhold 06/12/23 1158)  albuterol (PROVENTIL) (2.5 MG/3ML) 0.083% nebulizer solution 2.5 mg ( Nebulization MAR Unhold 06/12/23 1158)  alum & mag hydroxide-simeth (MAALOX/MYLANTA) 200-200-20 MG/5ML suspension 30 mL ( Oral MAR Unhold 06/12/23 1158)  morphine (PF) 2 MG/ML injection 1 mg ( Intravenous MAR Unhold 06/12/23 1158)  pantoprazole (PROTONIX) injection 40 mg (has no administration in time range)  sucralfate (CARAFATE) tablet 1 g (1 g Oral Given 06/12/23 1222)  pantoprazole (PROTONIX) injection 40  mg (40 mg Intravenous Given 06/12/23 0234)  alum & mag hydroxide-simeth (MAALOX/MYLANTA) 200-200-20 MG/5ML suspension 30 mL (30 mLs Oral Given 06/12/23 0234)    And  lidocaine (XYLOCAINE) 2 % viscous mouth solution 15 mL (15 mLs Oral Given 06/12/23 0234)  sodium chloride 0.9 % bolus 1,000 mL (0 mLs Intravenous Stopped 06/12/23 1053)  ondansetron (ZOFRAN) injection 4 mg (4 mg Intravenous Given 06/12/23 0345)  morphine (PF) 4 MG/ML injection 4 mg (4 mg Intravenous Given 06/12/23 0348)    Mobility walks     Focused Assessments N/A   R Recommendations: See Admitting Provider Note  Report given to:   Additional Notes: N/A

## 2023-06-13 DIAGNOSIS — K921 Melena: Secondary | ICD-10-CM

## 2023-06-13 DIAGNOSIS — Z8711 Personal history of peptic ulcer disease: Secondary | ICD-10-CM | POA: Diagnosis not present

## 2023-06-13 DIAGNOSIS — D649 Anemia, unspecified: Secondary | ICD-10-CM

## 2023-06-13 DIAGNOSIS — D62 Acute posthemorrhagic anemia: Secondary | ICD-10-CM | POA: Diagnosis present

## 2023-06-13 DIAGNOSIS — K254 Chronic or unspecified gastric ulcer with hemorrhage: Secondary | ICD-10-CM | POA: Diagnosis present

## 2023-06-13 DIAGNOSIS — Z801 Family history of malignant neoplasm of trachea, bronchus and lung: Secondary | ICD-10-CM | POA: Diagnosis not present

## 2023-06-13 DIAGNOSIS — I10 Essential (primary) hypertension: Secondary | ICD-10-CM | POA: Diagnosis present

## 2023-06-13 DIAGNOSIS — Z811 Family history of alcohol abuse and dependence: Secondary | ICD-10-CM | POA: Diagnosis not present

## 2023-06-13 DIAGNOSIS — K922 Gastrointestinal hemorrhage, unspecified: Secondary | ICD-10-CM | POA: Diagnosis present

## 2023-06-13 DIAGNOSIS — E876 Hypokalemia: Secondary | ICD-10-CM | POA: Diagnosis not present

## 2023-06-13 DIAGNOSIS — Z8249 Family history of ischemic heart disease and other diseases of the circulatory system: Secondary | ICD-10-CM | POA: Diagnosis not present

## 2023-06-13 DIAGNOSIS — Z792 Long term (current) use of antibiotics: Secondary | ICD-10-CM | POA: Diagnosis not present

## 2023-06-13 DIAGNOSIS — K2950 Unspecified chronic gastritis without bleeding: Secondary | ICD-10-CM | POA: Diagnosis present

## 2023-06-13 DIAGNOSIS — Z8619 Personal history of other infectious and parasitic diseases: Secondary | ICD-10-CM | POA: Diagnosis not present

## 2023-06-13 DIAGNOSIS — Z5329 Procedure and treatment not carried out because of patient's decision for other reasons: Secondary | ICD-10-CM | POA: Diagnosis present

## 2023-06-13 DIAGNOSIS — Z888 Allergy status to other drugs, medicaments and biological substances status: Secondary | ICD-10-CM | POA: Diagnosis not present

## 2023-06-13 DIAGNOSIS — Z803 Family history of malignant neoplasm of breast: Secondary | ICD-10-CM | POA: Diagnosis not present

## 2023-06-13 DIAGNOSIS — K2980 Duodenitis without bleeding: Secondary | ICD-10-CM | POA: Diagnosis present

## 2023-06-13 DIAGNOSIS — K264 Chronic or unspecified duodenal ulcer with hemorrhage: Secondary | ICD-10-CM

## 2023-06-13 DIAGNOSIS — F419 Anxiety disorder, unspecified: Secondary | ICD-10-CM | POA: Diagnosis present

## 2023-06-13 DIAGNOSIS — K3189 Other diseases of stomach and duodenum: Secondary | ICD-10-CM | POA: Diagnosis present

## 2023-06-13 DIAGNOSIS — Z79899 Other long term (current) drug therapy: Secondary | ICD-10-CM | POA: Diagnosis not present

## 2023-06-13 DIAGNOSIS — Z91148 Patient's other noncompliance with medication regimen for other reason: Secondary | ICD-10-CM | POA: Diagnosis not present

## 2023-06-13 DIAGNOSIS — K449 Diaphragmatic hernia without obstruction or gangrene: Secondary | ICD-10-CM | POA: Diagnosis present

## 2023-06-13 LAB — BASIC METABOLIC PANEL
Anion gap: 11 (ref 5–15)
BUN: 7 mg/dL (ref 6–20)
CO2: 22 mmol/L (ref 22–32)
Calcium: 8.7 mg/dL — ABNORMAL LOW (ref 8.9–10.3)
Chloride: 106 mmol/L (ref 98–111)
Creatinine, Ser: 0.71 mg/dL (ref 0.44–1.00)
GFR, Estimated: >60 mL/min (ref >60)
Glucose, Bld: 76 mg/dL (ref 70–99)
Potassium: 3.4 mmol/L — ABNORMAL LOW (ref 3.5–5.1)
Sodium: 139 mmol/L (ref 135–145)

## 2023-06-13 LAB — CBC
HCT: 24.6 % — ABNORMAL LOW (ref 36.0–46.0)
HCT: 23.3 % — ABNORMAL LOW (ref 36.0–46.0)
Hemoglobin: 7.6 g/dL — ABNORMAL LOW (ref 12.0–15.0)
Hemoglobin: 7.2 g/dL — ABNORMAL LOW (ref 12.0–15.0)
MCH: 29.1 pg (ref 26.0–34.0)
MCH: 28.7 pg (ref 26.0–34.0)
MCHC: 30.9 g/dL (ref 30.0–36.0)
MCHC: 30.9 g/dL (ref 30.0–36.0)
MCV: 94.3 fL (ref 80.0–100.0)
MCV: 92.8 fL (ref 80.0–100.0)
Platelets: 548 10*3/uL — ABNORMAL HIGH (ref 150–400)
Platelets: 526 10*3/uL — ABNORMAL HIGH (ref 150–400)
RBC: 2.61 MIL/uL — ABNORMAL LOW (ref 3.87–5.11)
RBC: 2.51 MIL/uL — ABNORMAL LOW (ref 3.87–5.11)
RDW: 15.8 % — ABNORMAL HIGH (ref 11.5–15.5)
RDW: 15.5 % (ref 11.5–15.5)
WBC: 5.1 10*3/uL (ref 4.0–10.5)
WBC: 5.5 10*3/uL (ref 4.0–10.5)
nRBC: 0 % (ref 0.0–0.2)
nRBC: 0 % (ref 0.0–0.2)

## 2023-06-13 MED ORDER — OXYCODONE HCL 5 MG PO TABS
2.5000 mg | ORAL_TABLET | ORAL | Status: DC | PRN
  Administered 2023-06-13 – 2023-06-14 (×5): 5 mg via ORAL
  Filled 2023-06-13 (×5): qty 1

## 2023-06-13 MED ORDER — ACETAMINOPHEN 325 MG PO TABS: 650.0000 mg | ORAL_TABLET | Freq: Four times a day (QID) | ORAL | Status: DC | PRN

## 2023-06-13 MED ORDER — PANTOPRAZOLE SODIUM 40 MG PO TBEC
40.0000 mg | DELAYED_RELEASE_TABLET | Freq: Two times a day (BID) | ORAL | Status: DC
  Administered 2023-06-13 – 2023-06-14 (×2): 40 mg via ORAL
  Filled 2023-06-13 (×2): qty 1

## 2023-06-13 MED ORDER — ACETAMINOPHEN 500 MG PO TABS
1000.0000 mg | ORAL_TABLET | Freq: Three times a day (TID) | ORAL | Status: DC
  Administered 2023-06-13 – 2023-06-14 (×3): 1000 mg via ORAL
  Filled 2023-06-13 (×3): qty 2

## 2023-06-13 NOTE — Progress Notes (Signed)
Mobility Specialist - Progress Note   06/13/23 1117  Mobility  Activity Ambulated with assistance in hallway  Level of Assistance Standby assist, set-up cues, supervision of patient - no hands on  Assistive Device None  Distance Ambulated (ft) 650 ft  Range of Motion/Exercises Active  Activity Response Tolerated well  Mobility Referral Yes  $Mobility charge 1 Mobility  Mobility Specialist Start Time (ACUTE ONLY) 1105  Mobility Specialist Stop Time (ACUTE ONLY) 1116  Mobility Specialist Time Calculation (min) (ACUTE ONLY) 11 min   Pt received in bed and agreed to mobility. Had some pain in abdomen and returned to bed with all needs met.  Marilynne Halsted Mobility Specialist

## 2023-06-13 NOTE — Progress Notes (Addendum)
Daily Progress Note  DOA: 06/11/2023 Hospital Day: 3  Chief Complaint: GI bleed / duodenal ulcer  ASSESSMENT   Brief Narrative:  Kimberly Bruce is a 52 y.o. year old female with a history of  HTN, anxiety, H. pylori infection, PUD secondary to NSAID's. Admitted 8/20 with melena and abdominal pain   Upper GI bleed 2/2 to large pyloric channel ulcer. Etiology? Denies NSAID use that she is aware of. Did take a few days of an OTC pain reliever prior to admission but was already having abdominal pain at that time   Acute on chronic anemia. Hgb 7.6, down from 9 late May No overt GI bleeding today   PLAN   Await gastric biopsies to evaluate for H.pylori infection . If negative consider gastrin testing as outpatient given recurent PUD Clear liquids Continue IV protonix 40mg  twice daily. Upon discharge she should be on 40mg  PO BID for several weeks until a follow up EGD can be done to confirm mucosal healing Continue carafate 1gm every 6 hours   -----------------------------------------------------------------------------------------------------    [facility] Attending   I have taken an interval history, reviewed the chart and examined the patient. I agree with the Advanced Practitioner's note, impression and recommendations with the following additions:  She is improving.,  I think she was not compliant with omeprazole Rx from May visit in our clinic per discussion today.  Also remembered taking some NSAIDs last week - or possible, "white pills" at work.  Hgb drifting, no stools. Recheck CBC now and AM - may need blood. Soft diet ordered.  Path returned, H pylori stain pending  A. GASTRIC, BIOPSY:       Gastric antral/oxyntic mucosa with chronic inactive gastritis.  No H.pylori identified on HE stain.  Immunohistochemical stain for H.pylori will be reported in an addendum.  Negative for intestinal metaplasia or dysplasia.    Changed PPI to oral, bid  Home in 1-2 days  I am thinking.  Iva Boop, MD, [facility] See Loretha Stapler on call - gastroenterology for best contact person 06/13/2023 4:31 PM    Subjective   No significant abdominal pain or nausea today. No melena. Would like to try clear liquids   Objective   EGD 06/12/23 for epigastric pain  - Esophagogastric landmarks identified. - Small hiatal hernia. - Normal esophagus otherwise - Large cratered ulcer with multiple red spots and stigmata for recent bleeding in the pyloric channel / duodenal bulb as outlined. Hemostatic gel (Purastat) applied with good result. - Normal stomach otherwise - biopsies taken to rule out H pylori - Edematous duodenal bulb - Normal remaining duodenum.    Subjective      Objective     Recent Labs    06/11/23 1907 06/12/23 0234 06/13/23 0702  WBC 7.1  --  5.1  HGB 8.6* 8.2* 7.6*  HCT 27.5* 25.9* 24.6*  PLT 625*  --  548*   BMET Recent Labs    06/11/23 1907 06/13/23 0702  NA 136 139  K 3.6 3.4*  CL 101 106  CO2 25 22  GLUCOSE 97 76  BUN 13 7  CREATININE 0.89 0.71  CALCIUM 9.2 8.7*   LFT Recent Labs    06/11/23 1907  PROT 8.4*  ALBUMIN 4.0  AST 20  ALT 9  ALKPHOS 75  BILITOT 0.5   PT/INR No results for input(s): "LABPROT", "INR" in the last 72 hours.   Imaging:  US Abdomen Limited RUQ (LIVER/GB) CLINICAL DATA:  52 year old  female with history of right upper quadrant abdominal pain.  EXAM: ULTRASOUND ABDOMEN LIMITED RIGHT UPPER QUADRANT  COMPARISON:  No prior right upper quadrant ultrasound. CT of the abdomen and pelvis 01/12/2023.  FINDINGS: Gallbladder:  No gallstones or wall thickening visualized. No sonographic Murphy sign noted by sonographer.  Common bile duct:  Diameter: 3 mm  Liver:  No focal lesion identified. Within normal limits in parenchymal echogenicity. Portal vein is patent on color Doppler imaging with normal direction of blood flow towards the liver.  Other:  None.  IMPRESSION: 1. No acute findings. Specifically, no gallstones or findings to suggest an acute cholecystitis.  Electronically Signed   By: Trudie Reed M.D.   On: 01/12/2023 05:35 CT ABDOMEN PELVIS WO CONTRAST CLINICAL DATA:  Abdominal pain, acute, nonlocalized  EXAM: CT ABDOMEN AND PELVIS WITHOUT CONTRAST  TECHNIQUE: Multidetector CT imaging of the abdomen and pelvis was performed following the standard protocol without IV contrast.  RADIATION DOSE REDUCTION: This exam was performed according to the departmental dose-optimization program which includes automated exposure control, adjustment of the mA and/or kV according to patient size and/or use of iterative reconstruction technique.  COMPARISON:  None Available.  FINDINGS: Lower chest: No acute abnormality.  Hepatobiliary: No focal liver abnormality is seen. No gallstones, gallbladder wall thickening, or biliary dilatation.  Pancreas: There is subtle inflammatory stranding surrounding the first and second portion of the duodenum, pancreatic head and proximal body which may reflect changes related to acute pancreatitis or duodenitis. The pancreatic duct is not dilated. No peripancreatic fluid collections are identified.  Spleen: Unremarkable  Adrenals/Urinary Tract: The adrenal glands are unremarkable. The kidneys are normal in size and position. Simple cortical cyst noted within the upper pole and interpolar region of the right kidney for which no follow-up imaging is recommended. The kidneys are otherwise unremarkable. The bladder is unremarkable.  Stomach/Bowel: As noted above, subtle inflammatory changes are seen surrounding the proximal duodenum and head of the pancreas which may relate to changes of duodenitis or acute pancreatitis. The stomach, small bowel, and large bowel are otherwise unremarkable. Appendix normal. No free intraperitoneal gas or fluid.  Vascular/Lymphatic: No significant  vascular findings are present. No enlarged abdominal or pelvic lymph nodes.  Reproductive: Uterus and bilateral adnexa are unremarkable.  Other: No abdominal wall hernia or abnormality. No abdominopelvic ascites.  Musculoskeletal: No acute or significant osseous findings.  IMPRESSION: 1. Subtle inflammatory changes surrounding the proximal duodenum, pancreatic head and proximal body which may reflect changes of acute pancreatitis or duodenitis. Correlation with serum amylase and lipase may be helpful for further evaluation.  Electronically Signed   By: Helyn Numbers M.D.   On: 01/12/2023 03:52     Scheduled inpatient medications:   pantoprazole (PROTONIX) IV  40 mg Intravenous Q12H   propranolol  20 mg Oral QPM   sucralfate  1 g Oral Q6H   Continuous inpatient infusions:   sodium chloride 50 mL/hr at 06/13/23 0618   PRN inpatient medications: acetaminophen **OR** acetaminophen, albuterol, alum & mag hydroxide-simeth, morphine injection, ondansetron **OR** ondansetron (ZOFRAN) IV, zolpidem  Vital signs in last 24 hours: Temp:  [97 F (36.1 C)-98.8 F (37.1 C)] 97.9 F (36.6 C) (08/21 0619) Pulse Rate:  [67-86] 69 (08/21 0619) Resp:  [12-19] 18 (08/21 0619) BP: (107-126)/(54-88) 117/70 (08/21 0619) SpO2:  [97 %-100 %] 99 % (08/21 0619)    Intake/Output Summary (Last 24 hours) at 06/13/2023 0900 Last data filed at 06/12/2023 2000 Gross per 24 hour  Intake 376.89  ml  Output --  Net 376.89 ml    Intake/Output from previous day: 08/20 0701 - 08/21 0700 In: 376.9 [P.O.:80; I.V.:296.9] Out: -  Intake/Output this shift: No intake/output data recorded.   Physical Exam:  General: Alert female in NAD Heart:  Regular rate and rhythm.  Pulmonary: Normal respiratory effort Abdomen: Soft, nondistended, nontender. Normal bowel sounds. Extremities: No lower extremity edema  Neurologic: Alert and oriented Psych: Pleasant. Cooperative. Insight appears normal.    Active  Problems:   Anemia   Upper GI bleed   Abdominal pain, epigastric   Melena     LOS: 0 days   Willette Cluster ,NP 06/13/2023, 9:00 AM

## 2023-06-13 NOTE — Progress Notes (Signed)
   06/13/23 1322  TOC Brief Assessment  Insurance and Status Reviewed  Patient has primary care physician Yes  Home environment has been reviewed Home  Prior level of function: Independent  Prior/Current Home Services No current home services  Social Determinants of Health Reivew SDOH reviewed no interventions necessary  Readmission risk has been reviewed Yes  Transition of care needs no transition of care needs at this time

## 2023-06-13 NOTE — Plan of Care (Signed)
  Problem: Education: Goal: Knowledge of General Education information will improve Description: Including pain rating scale, medication(s)/side effects and non-pharmacologic comfort measures Outcome: Progressing   Problem: Activity: Goal: Risk for activity intolerance will decrease Outcome: Progressing   Problem: Pain Managment: Goal: General experience of comfort will improve Outcome: Progressing   

## 2023-06-13 NOTE — Plan of Care (Signed)

## 2023-06-13 NOTE — Progress Notes (Signed)
PROGRESS NOTE    Kimberly Bruce  ZOX:[id number] DOB: 08-15-1971 DOA: 06/11/2023 PCP: [facility]  Chief Complaint  Patient presents with   Rectal Bleeding    Brief Narrative:   52 year old female with history of hypertension, anxiety, medication noncompliance, prior treatment for H. pylori PUD being admitted to the hospital with abdominal pain, melena, blood loss anemia and concern for recurrent peptic ulcer disease.  Assessment & Plan:   Principal Problem:   GI bleed Active Problems:   Anemia   Upper GI bleed   Abdominal pain, epigastric   Melena  Upper GI Bleed Peptic Ulcer Disease Melena  Acute Blood Loss Anemia S/p EGD with large cratered ulcer with multiple red spots and stigmata for recent bleeding in the pyloric channel/duodenal bulb - hemostatic gel applied Biopsies pending for r/o h pylori Ruling out H pylori, if negative, consideration for gastrin testing outpatient given recurrent PUD GI recommending IV PPI BID, carafate 1 g q6, avoid all NSAIDS, clear liquids Trend H/H, transfuse as indicated    Hypertension-continue home propranolol - hold home HCTZ  Has phentermine on med list, will need to review if she's taking    DVT prophylaxis: SCD Code Status: full Family Communication: none Disposition:   Status is: Inpatient Remains inpatient appropriate because: need for further inpatient care   Consultants:  GI  Procedures:  EGD   Antimicrobials:  Anti-infectives (From admission, onward)    None       Subjective: No new complaints Continued abdominal pain, improved  Objective: Vitals:   06/12/23 2228 06/13/23 0205 06/13/23 0619 06/13/23 1322  BP: 118/72 (!) 113/56 117/70 112/67  Pulse: 72 77 69 73  Resp: 17 17 18 18   Temp: 98.8 F (37.1 C) 97.7 F (36.5 C) 97.9 F (36.6 C) 98 F (36.7 C)  TempSrc:   Oral   SpO2: 98% 98% 99% 97%  Weight:      Height:        Intake/Output Summary (Last 24 hours) at 06/13/2023 1554 Last data  filed at 06/13/2023 1321 Gross per 24 hour  Intake 852.89 ml  Output --  Net 852.89 ml   Filed Weights   06/11/23 1838  Weight: 73 kg    Examination:  General exam: Appears calm and comfortable  Respiratory system: unlabored Cardiovascular system: RRR Gastrointestinal system: Abdomen is nondistended, soft and nontender.  Central nervous system: Alert and oriented. No focal neurological deficits. Extremities: no LEE   Data Reviewed: I have personally reviewed following labs and imaging studies  CBC: Recent Labs  Lab 06/07/23 1843 06/11/23 1907 06/12/23 0234 06/13/23 0702  WBC 8.5 7.1  --  5.1  HGB 9.2* 8.6* 8.2* 7.6*  HCT 29.3* 27.5* 25.9* 24.6*  MCV 92.4 91.1  --  94.3  PLT 483* 625*  --  548*    Basic Metabolic Panel: Recent Labs  Lab 06/07/23 1843 06/07/23 2344 06/11/23 1907 06/13/23 0702  NA 134* 135 136 139  K 3.9 4.0 3.6 3.4*  CL 100 105 101 106  CO2 25 24 25 22   GLUCOSE 86 85 97 76  BUN 18 16 13 7   CREATININE 1.42* 1.08* 0.89 0.71  CALCIUM 9.1 8.6* 9.2 8.7*    GFR: Estimated Creatinine Clearance: 71.6 mL/min (by C-G formula based on SCr of 0.71 mg/dL).  Liver Function Tests: Recent Labs  Lab 06/07/23 1843 06/11/23 1907  AST 17 20  ALT 7 9  ALKPHOS 78 75  BILITOT 0.5 0.5  PROT 7.7 8.4*  ALBUMIN 4.0  4.0    CBG: No results for input(s): "GLUCAP" in the last 168 hours.   No results found for this or any previous visit (from the past 240 hour(s)).       Radiology Studies: No results found.      Scheduled Meds:  acetaminophen  1,000 mg Oral Q8H   pantoprazole (PROTONIX) IV  40 mg Intravenous Q12H   propranolol  20 mg Oral QPM   sucralfate  1 g Oral Q6H   Continuous Infusions:  sodium chloride 50 mL/hr at 06/13/23 0618     LOS: 0 days    Time spent: over 30 min    Lacretia Nicks, MD [facility]   To contact the attending provider between 7A-7P or the covering provider during after hours 7P-7A, please log  into the web site [email or web address] and access using universal [place] password for that web site. If you do not have the password, please call the hospital operator.  06/13/2023, 3:54 PM

## 2023-06-14 ENCOUNTER — Encounter: Payer: 59 | Admitting: Gastroenterology

## 2023-06-14 DIAGNOSIS — D62 Acute posthemorrhagic anemia: Secondary | ICD-10-CM

## 2023-06-14 DIAGNOSIS — K254 Chronic or unspecified gastric ulcer with hemorrhage: Secondary | ICD-10-CM | POA: Diagnosis not present

## 2023-06-14 DIAGNOSIS — K922 Gastrointestinal hemorrhage, unspecified: Secondary | ICD-10-CM | POA: Diagnosis not present

## 2023-06-14 LAB — CBC
HCT: 24 % — ABNORMAL LOW (ref 36.0–46.0)
Hemoglobin: 7.6 g/dL — ABNORMAL LOW (ref 12.0–15.0)
MCH: 29.5 pg (ref 26.0–34.0)
MCHC: 31.7 g/dL (ref 30.0–36.0)
MCV: 93 fL (ref 80.0–100.0)
Platelets: 544 10*3/uL — ABNORMAL HIGH (ref 150–400)
RBC: 2.58 MIL/uL — ABNORMAL LOW (ref 3.87–5.11)
RDW: 15.8 % — ABNORMAL HIGH (ref 11.5–15.5)
WBC: 5.2 10*3/uL (ref 4.0–10.5)
nRBC: 0 % (ref 0.0–0.2)

## 2023-06-14 LAB — BASIC METABOLIC PANEL
Anion gap: 7 (ref 5–15)
BUN: <5 mg/dL — ABNORMAL LOW (ref 6–20)
CO2: 25 mmol/L (ref 22–32)
Calcium: 8.8 mg/dL — ABNORMAL LOW (ref 8.9–10.3)
Chloride: 110 mmol/L (ref 98–111)
Creatinine, Ser: 0.76 mg/dL (ref 0.44–1.00)
GFR, Estimated: >60 mL/min (ref >60)
Glucose, Bld: 89 mg/dL (ref 70–99)
Potassium: 3.4 mmol/L — ABNORMAL LOW (ref 3.5–5.1)
Sodium: 142 mmol/L (ref 135–145)

## 2023-06-14 LAB — SURGICAL PATHOLOGY

## 2023-06-14 LAB — MAGNESIUM: Magnesium: 2.1 mg/dL (ref 1.7–2.4)

## 2023-06-14 LAB — PHOSPHORUS: Phosphorus: 3.5 mg/dL (ref 2.5–4.6)

## 2023-06-14 MED ORDER — POTASSIUM CHLORIDE CRYS ER 20 MEQ PO TBCR
40.0000 meq | EXTENDED_RELEASE_TABLET | Freq: Once | ORAL | Status: AC
  Administered 2023-06-14: 40 meq via ORAL
  Filled 2023-06-14: qty 2

## 2023-06-14 MED ORDER — OXYCODONE HCL 5 MG PO TABS: 2.5000 mg | ORAL_TABLET | Freq: Four times a day (QID) | ORAL | 0 refills | Status: AC | PRN

## 2023-06-14 MED ORDER — SUCRALFATE 1 G PO TABS: 1.0000 g | ORAL_TABLET | Freq: Three times a day (TID) | ORAL | 1 refills | Status: AC

## 2023-06-14 MED ORDER — FERROUS SULFATE 325 (65 FE) MG PO TBEC: 325.0000 mg | DELAYED_RELEASE_TABLET | ORAL | 1 refills | Status: AC

## 2023-06-14 MED ORDER — PANTOPRAZOLE SODIUM 40 MG PO TBEC: 40.0000 mg | DELAYED_RELEASE_TABLET | Freq: Two times a day (BID) | ORAL | 1 refills | Status: AC

## 2023-06-14 NOTE — Plan of Care (Signed)

## 2023-06-14 NOTE — Progress Notes (Signed)
Mobility Specialist - Progress Note   06/14/23 1036  Mobility  Activity Ambulated with assistance in hallway  Level of Assistance Independent after set-up  Assistive Device Other (Comment) (IV Pole)  Distance Ambulated (ft) 500 ft  Activity Response Tolerated well  Mobility Referral Yes  $Mobility charge 1 Mobility  Mobility Specialist Start Time (ACUTE ONLY) 1027  Mobility Specialist Stop Time (ACUTE ONLY) 1035  Mobility Specialist Time Calculation (min) (ACUTE ONLY) 8 min   Pt received in bed and agreeable to mobility. No complaints during session. Pt to bed after session with all needs met.    [facility]

## 2023-06-14 NOTE — Progress Notes (Addendum)
Daily Progress Note  DOA: 06/11/2023 Hospital Day: 4  Chief Complaint: GI bleed / duodenal ulcer   ASSESSMENT & PLAN   Brief Narrative:  Kimberly Bruce is a 52 y.o. year old female with a history of  HTN, anxiety, H. 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 Admitted 8/20 with melena and abdominal pain   Upper GI bleed 2/2 to large pyloric channel ulcer. Etiology? Denies NSAID use that she is aware of. Did take a few days of an OTC pain reliever prior to admission but was already having abdominal pain at that time. Biopsies negative for H.pylori ---No overt GI bleeding today. Hasn't had any BMs today. Tolerating soft diet.  ---Ok for discharge from GI standpoint.  --Stay on Pantoprazole 40 mg or Omeprazole 40 mg BID until seen in clinic on 08/31/23 at 1030 --Continue Carafate AC and HS for an additional week --Absolutely no NSAIDs. Agrees to tylenol only --Will arrange for follow up EGD to document ulcer healing when I see her in clinic.   Acute on chronic anemia. Hgb 7.6, down from 9 late May -Hgb stable at 7.6 ( actually up from 7.2 overnight).  -May benefit from dose of IV iron prior to discharge. She does have a history of iron deficiency.    Subjective   Feels okay. No BMs / bleeding. Had crackers for breakfast.    Objective   FINAL MICROSCOPIC DIAGNOSIS:   A. GASTRIC, BIOPSY:  Gastric antral/oxyntic mucosa with chronic inactive gastritis.  No H.pylori identified on HE stain.  Negative for intestinal metaplasia or dysplasia.  ADDENDUM:  Immunohistochemical stain for H. pylori was performed and is negative for H. pylori microorganisms.    Recent Labs    06/13/23 0702 06/13/23 1700 06/14/23 0538  WBC 5.1 5.5 5.2  HGB 7.6* 7.2* 7.6*  HCT 24.6* 23.3* 24.0*  PLT 548* 526* 544*   BMET Recent Labs    06/11/23 1907 06/13/23 0702 06/14/23 0538  NA 136 139 142  K 3.6 3.4* 3.4*  CL 101 106 110  CO2 25 22 25   GLUCOSE 97 76 89  BUN 13 7 <5*   CREATININE 0.89 0.71 0.76  CALCIUM 9.2 8.7* 8.8*   LFT Recent Labs    06/11/23 1907  PROT 8.4*  ALBUMIN 4.0  AST 20  ALT 9  ALKPHOS 75  BILITOT 0.5   PT/INR No results for input(s): "LABPROT", "INR" in the last 72 hours.   Imaging:  US Abdomen Limited RUQ (LIVER/GB) CLINICAL DATA:  52 year old female with history of right upper quadrant abdominal pain.  EXAM: ULTRASOUND ABDOMEN LIMITED RIGHT UPPER QUADRANT  COMPARISON:  No prior right upper quadrant ultrasound. CT of the abdomen and pelvis 01/12/2023.  FINDINGS: Gallbladder:  No gallstones or wall thickening visualized. No sonographic Murphy sign noted by sonographer.  Common bile duct:  Diameter: 3 mm  Liver:  No focal lesion identified. Within normal limits in parenchymal echogenicity. Portal vein is patent on color Doppler imaging with normal direction of blood flow towards the liver.  Other: None.  IMPRESSION: 1. No acute findings. Specifically, no gallstones or findings to suggest an acute cholecystitis.  Electronically Signed   By: Trudie Reed M.D.   On: 01/12/2023 05:35 CT ABDOMEN PELVIS WO CONTRAST CLINICAL DATA:  Abdominal pain, acute, nonlocalized  EXAM: CT ABDOMEN AND PELVIS WITHOUT CONTRAST  TECHNIQUE: Multidetector CT imaging of the abdomen and pelvis was performed following the standard protocol without IV contrast.  RADIATION DOSE REDUCTION: This  exam was performed according to the departmental dose-optimization program which includes automated exposure control, adjustment of the mA and/or kV according to patient size and/or use of iterative reconstruction technique.  COMPARISON:  None Available.  FINDINGS: Lower chest: No acute abnormality.  Hepatobiliary: No focal liver abnormality is seen. No gallstones, gallbladder wall thickening, or biliary dilatation.  Pancreas: There is subtle inflammatory stranding surrounding the first and second portion of the duodenum,  pancreatic head and proximal body which may reflect changes related to acute pancreatitis or duodenitis. The pancreatic duct is not dilated. No peripancreatic fluid collections are identified.  Spleen: Unremarkable  Adrenals/Urinary Tract: The adrenal glands are unremarkable. The kidneys are normal in size and position. Simple cortical cyst noted within the upper pole and interpolar region of the right kidney for which no follow-up imaging is recommended. The kidneys are otherwise unremarkable. The bladder is unremarkable.  Stomach/Bowel: As noted above, subtle inflammatory changes are seen surrounding the proximal duodenum and head of the pancreas which may relate to changes of duodenitis or acute pancreatitis. The stomach, small bowel, and large bowel are otherwise unremarkable. Appendix normal. No free intraperitoneal gas or fluid.  Vascular/Lymphatic: No significant vascular findings are present. No enlarged abdominal or pelvic lymph nodes.  Reproductive: Uterus and bilateral adnexa are unremarkable.  Other: No abdominal wall hernia or abnormality. No abdominopelvic ascites.  Musculoskeletal: No acute or significant osseous findings.  IMPRESSION: 1. Subtle inflammatory changes surrounding the proximal duodenum, pancreatic head and proximal body which may reflect changes of acute pancreatitis or duodenitis. Correlation with serum amylase and lipase may be helpful for further evaluation.  Electronically Signed   By: Helyn Numbers M.D.   On: 01/12/2023 03:52     Scheduled inpatient medications:   acetaminophen  1,000 mg Oral Q8H   pantoprazole  40 mg Oral BID AC   propranolol  20 mg Oral QPM   sucralfate  1 g Oral Q6H   Continuous inpatient infusions:   sodium chloride 50 mL/hr at 06/14/23 0500   PRN inpatient medications: acetaminophen **FOLLOWED BY** [START ON 06/16/2023] acetaminophen, albuterol, alum & mag hydroxide-simeth, morphine injection, ondansetron **OR**  ondansetron (ZOFRAN) IV, oxyCODONE, zolpidem  Vital signs in last 24 hours: Temp:  [97.9 F (36.6 C)-98 F (36.7 C)] 98 F (36.7 C) (08/22 1000) Pulse Rate:  [69-74] 72 (08/22 1000) Resp:  [16-18] 16 (08/22 1000) BP: (112-122)/(64-69) 122/64 (08/22 1000) SpO2:  [97 %-100 %] 100 % (08/22 0526) Last BM Date : 06/11/23 (per pt report)  Intake/Output Summary (Last 24 hours) at 06/14/2023 1146 Last data filed at 06/14/2023 0500 Gross per 24 hour  Intake 2754.93 ml  Output --  Net 2754.93 ml    Intake/Output from previous day: 08/21 0701 - 08/22 0700 In: 2754.9 [P.O.:956; I.V.:1798.9] Out: -  Intake/Output this shift: No intake/output data recorded.   Physical Exam:  General: Alert female in NAD Heart:  Regular rate and rhythm.  Pulmonary: Normal respiratory effort Abdomen: Soft, nondistended, nontender. Normal bowel sounds. Extremities: No lower extremity edema  Neurologic: Alert and oriented Psych: Pleasant. Cooperative. Insight appears normal.    Principal Problem:   GI bleed Active Problems:   Anemia   Upper GI bleed   Abdominal pain, epigastric   Melena   Duodenal ulcer with hemorrhage     LOS: 1 day   Willette Cluster ,NP 06/14/2023, 11:46 AM  I have taken an interval history, thoroughly reviewed the chart and examined the patient. I agree with the Advanced Practitioner's note,  impression and recommendations, and have recorded additional findings, impressions and recommendations below. I performed a substantive portion of this encounter (>50% time spent), including a complete performance of the medical decision making.  My additional thoughts are as follows:  Gastric ulcer with bleeding, bleeding has stopped, hemoglobin stable.  She needs iron supplementation (p.o. and/or perhaps a dose of IV prior to discharge)  Biopsy showed no H. pylori, and our office will arrange outpatient follow-up including repeat EGD to document healing.  Okay for discharge home  today from GI perspective.   Charlie Pitter III Office:[phone]

## 2023-06-14 NOTE — Discharge Summary (Addendum)
Physician Discharge Summary  Kimberly Bruce KGM:[id number] DOB: April 09, 1971 DOA: 06/11/2023  PCP: [facility]  Admit date: 06/11/2023 Discharge date: 06/14/2023  Time spent: 40 minutes  Recommendations for Outpatient Follow-up:  Follow outpatient CBC/CMP  Follow iron studies Needs repeat EGD with GI to confirm mucosal healing Discharged on BID protonix and carafate  AVOID all NSAIDS   Discharge Diagnoses:  Principal Problem:   GI bleed Active Problems:   Anemia   Upper GI bleed   Abdominal pain, epigastric   Melena   Duodenal ulcer with hemorrhage   Discharge Condition: stable  Diet recommendation: heart healthy  Filed Weights   06/11/23 1838  Weight: 73 kg    History of present illness:   52 year old female with history of hypertension, anxiety, medication noncompliance, prior treatment for H. pylori PUD being admitted to the hospital with abdominal pain, melena, blood loss anemia and concern for recurrent peptic ulcer disease.   She had EGD with large cratered ulcer as noted below.  Biopsy with chronic inactive gastritis, negative for h pylori.  PPI BID until follow up for repeat EGD with GI.  Carafate.  Of note, she left AMA today (not sure why, I think due to waiting - seen by GI at 1230, but I was not aware she'd been cleared for discharge until after 3 PM).  She left AMA before 4 PM and I was not able to speak to her until she'd already left the hospital.  I called her phone and sent in protonix, carafate, and oxycodone prn.  She'll need to follow with her outpatient providers.   Hospital Course:  Assessment and Plan:  Upper GI Bleed Peptic Ulcer Disease Melena  Acute Blood Loss Anemia S/p EGD with large cratered ulcer with multiple red spots and stigmata for recent bleeding in the pyloric channel/duodenal bulb - hemostatic gel applied Biopsies pending for r/o h pylori -> negative for H pylori, chronic inactive gastritis consideration for gastrin testing  outpatient given recurrent PUD per GI GI recommending PPI BID until , carafate 1 g q6, avoid all NSAIDS   Hypertension-continue home propranolol, apparently takes hydrochlorothiazide prn    Has phentermine on med list, will need to review if she's taking (she notes not currently taking)    Procedures: EGD   Consultations: GI  Discharge Exam: Vitals:   06/14/23 0526 06/14/23 1000  BP: 116/66 122/64  Pulse: 74 72  Resp: 18 16  Temp: 97.9 F (36.6 C) 98 F (36.7 C)  SpO2: 100%    Continued mild abdominal pain  General: No acute distress. Cardiovascular: RRR Lungs: unlabored Abdomen: mild ttp  Neurological: Alert and oriented 3. Moves all extremities 4 with equal strength. Cranial nerves II through XII grossly intact. Extremities: No clubbing or cyanosis. No edema.   Discharge Instructions   Discharge Instructions     Diet - low sodium heart healthy   Complete by: As directed    Discharge instructions   Complete by: As directed    Increase activity slowly   Complete by: As directed       Allergies as of 06/14/2023       Reactions   Citalopram Nausea And Vomiting        Medication List     STOP taking these medications    amoxicillin 500 MG capsule Commonly known as: AMOXIL   clarithromycin 500 MG tablet Commonly known as: BIAXIN   omeprazole 40 MG capsule Commonly known as: PRILOSEC       TAKE these  medications    aluminum-magnesium hydroxide 200-200 MG/5ML suspension Take 15 mLs by mouth every 6 (six) hours as needed for up to 7 days for indigestion (for abdominal pain).   hydrochlorothiazide 12.5 MG tablet Commonly known as: HYDRODIURIL Take 12.5 mg by mouth daily as needed.   oxyCODONE 5 MG immediate release tablet Commonly known as: Oxy IR/ROXICODONE Take 0.5-1 tablets (2.5-5 mg total) by mouth every 6 (six) hours as needed for moderate pain.   pantoprazole 40 MG tablet Commonly known as: PROTONIX Take 1 tablet (40 mg total) by  mouth 2 (two) times daily before [name] meal. Continue this twice [name] day until you get your follow up EGD with gastroenterology   phentermine 30 MG capsule Take 30 mg by mouth daily.   propranolol 20 MG tablet Commonly known as: INDERAL Take 20 mg by mouth every evening.   sucralfate 1 g tablet Commonly known as: CARAFATE Take 1 tablet (1 g total) by mouth 4 (four) times daily -  with meals and at bedtime.   Vitamin D (Ergocalciferol) 1.25 MG (50000 UNIT) Caps capsule Commonly known as: DRISDOL Take 50,000 Units by mouth once [name] week.   zolpidem 10 MG tablet Commonly known as: AMBIEN Take 10 mg by mouth at bedtime as needed for sleep.       Allergies  Allergen Reactions   Citalopram Nausea And Vomiting    Follow-up Information     Meredith Pel, NP Follow up on 08/31/2023.   Specialty: Gastroenterology Why: at 10:30 am Contact information: [address] [place] [address] [phone]                  The results of significant diagnostics from this hospitalization (including imaging, microbiology, ancillary and laboratory) are listed below for reference.    Significant Diagnostic Studies: No results found.  Microbiology: No results found for this or any previous visit (from the past 240 hour(s)).   Labs: Basic Metabolic Panel: Recent Labs  Lab 06/07/23 1843 06/07/23 2344 06/11/23 1907 06/13/23 0702 06/14/23 0538  NA 134* 135 136 139 142  K 3.9 4.0 3.6 3.4* 3.4*  CL 100 105 101 106 110  CO2 25 24 25 22 25   GLUCOSE 86 85 97 76 89  BUN 18 16 13 7  <5*  CREATININE 1.42* 1.08* 0.89 0.71 0.76  CALCIUM 9.1 8.6* 9.2 8.7* 8.8*  MG  --   --   --   --  2.1  PHOS  --   --   --   --  3.5   Liver Function Tests: Recent Labs  Lab 06/07/23 1843 06/11/23 1907  AST 17 20  ALT 7 9  ALKPHOS 78 75  BILITOT 0.5 0.5  PROT 7.7 8.4*  ALBUMIN 4.0 4.0   Recent Labs  Lab 06/07/23 1843  LIPASE 22   No results for input(s): "AMMONIA" in the last 168  hours. CBC: Recent Labs  Lab 06/07/23 1843 06/11/23 1907 06/12/23 0234 06/13/23 0702 06/13/23 1700 06/14/23 0538  WBC 8.5 7.1  --  5.1 5.5 5.2  HGB 9.2* 8.6* 8.2* 7.6* 7.2* 7.6*  HCT 29.3* 27.5* 25.9* 24.6* 23.3* 24.0*  MCV 92.4 91.1  --  94.3 92.8 93.0  PLT 483* 625*  --  548* 526* 544*   Cardiac Enzymes: No results for input(s): "CKTOTAL", "CKMB", "CKMBINDEX", "TROPONINI" in the last 168 hours. BNP: BNP (last 3 results) No results for input(s): "BNP" in the last 8760 hours.  ProBNP (last 3 results) No  results for input(s): "PROBNP" in the last 8760 hours.  CBG: No results for input(s): "GLUCAP" in the last 168 hours.     Signed:  Lacretia Nicks MD.  [facility] 06/14/2023, 4:25 PM

## 2023-06-14 NOTE — Progress Notes (Signed)
Pt signed out AMA.  MD notified.  Pt left the unit on foot.

## 2023-06-16 ENCOUNTER — Encounter (HOSPITAL_COMMUNITY): Payer: Self-pay | Admitting: Gastroenterology

## 2023-06-19 ENCOUNTER — Telehealth: Payer: Self-pay | Admitting: Gastroenterology

## 2023-06-19 NOTE — Telephone Encounter (Signed)
Called and spoke with patient regarding recommendations as outlined below. Pt has been advised to keep OV as scheduled and continue Protonix BID as prescribed. Pt verbalized understanding and had no concerns at ht end of the call.

## 2023-06-19 NOTE — Telephone Encounter (Signed)
PT is calling because whatever was put in her came out. Its like a jelly filling. It came out this weekend. Wants to discuss this with a nurse. Please advise.

## 2023-06-19 NOTE — Telephone Encounter (Signed)
As long as she is not having any recurrent bleeding or pain, nothing to worry about.  She can keep an eye on this moving forward.  I believe she has office visit scheduled in the upcoming months, continue PPI as previously recommended.  Thanks

## 2023-06-19 NOTE — Telephone Encounter (Signed)
Returned call to patient. Pt states that at the time of her EGD on 06/12/23 purastat was placed in duodenal ulcer. Patient reports passing purastat in her stool on Sunday, pt wanted to know if this was ok. Pt denies any melena, blood in the stool, or any pain. Please advise, thanks.

## 2023-07-23 ENCOUNTER — Ambulatory Visit: Payer: 59 | Admitting: Internal Medicine

## 2023-08-31 ENCOUNTER — Ambulatory Visit: Payer: 59 | Admitting: Nurse Practitioner
